# Patient Record
Sex: Female | Born: 1935 | Race: White | Hispanic: No | Smoking: Never smoker
Health system: Southern US, Community
[De-identification: ages and names within clinical notes are randomized; demographics above are authoritative.]

## PROBLEM LIST (undated history)

## (undated) DIAGNOSIS — E039 Hypothyroidism, unspecified: Secondary | ICD-10-CM

## (undated) DIAGNOSIS — I499 Cardiac arrhythmia, unspecified: Secondary | ICD-10-CM

## (undated) DIAGNOSIS — E876 Hypokalemia: Secondary | ICD-10-CM

## (undated) DIAGNOSIS — H409 Unspecified glaucoma: Secondary | ICD-10-CM

## (undated) DIAGNOSIS — I4891 Unspecified atrial fibrillation: Secondary | ICD-10-CM

## (undated) DIAGNOSIS — H3530 Unspecified macular degeneration: Secondary | ICD-10-CM

## (undated) HISTORY — PX: CATARACT EXTRACTION: SUR2

## (undated) HISTORY — PX: APPENDECTOMY: SHX54

## (undated) HISTORY — DX: Hypokalemia: E87.6

## (undated) HISTORY — PX: ABDOMINAL HYSTERECTOMY: SHX81

## (undated) HISTORY — DX: Unspecified macular degeneration: H35.30

---

## 1999-10-30 ENCOUNTER — Encounter: Payer: Self-pay | Admitting: Family Medicine

## 1999-10-30 ENCOUNTER — Ambulatory Visit (HOSPITAL_COMMUNITY): Admission: RE | Admit: 1999-10-30 | Discharge: 1999-10-30 | Payer: Self-pay | Admitting: Family Medicine

## 2003-03-18 ENCOUNTER — Ambulatory Visit (HOSPITAL_COMMUNITY): Admission: RE | Admit: 2003-03-18 | Discharge: 2003-03-18 | Payer: Self-pay | Admitting: Gastroenterology

## 2003-03-25 ENCOUNTER — Encounter: Admission: RE | Admit: 2003-03-25 | Discharge: 2003-03-25 | Payer: Self-pay | Admitting: Gastroenterology

## 2010-09-09 ENCOUNTER — Emergency Department (HOSPITAL_COMMUNITY)
Admission: EM | Admit: 2010-09-09 | Discharge: 2010-09-09 | Disposition: A | Discharge status: Home / Self Care | Payer: Medicare Other | Attending: Emergency Medicine | Admitting: Emergency Medicine

## 2010-09-09 DIAGNOSIS — R51 Headache: Secondary | ICD-10-CM | POA: Insufficient documentation

## 2010-09-09 DIAGNOSIS — E039 Hypothyroidism, unspecified: Secondary | ICD-10-CM | POA: Insufficient documentation

## 2010-09-09 DIAGNOSIS — E7800 Pure hypercholesterolemia, unspecified: Secondary | ICD-10-CM | POA: Insufficient documentation

## 2010-09-09 DIAGNOSIS — I10 Essential (primary) hypertension: Secondary | ICD-10-CM | POA: Insufficient documentation

## 2010-09-09 LAB — URINALYSIS, ROUTINE W REFLEX MICROSCOPIC
Specific Gravity, Urine: 1.009 (ref 1.005–1.030)
Urobilinogen, UA: 0.2 mg/dL (ref 0.0–1.0)

## 2010-09-09 LAB — CBC
HCT: 41.7 % (ref 36.0–46.0)
Hemoglobin: 14.1 g/dL (ref 12.0–15.0)
MCH: 32.3 pg (ref 26.0–34.0)
MCHC: 33.8 g/dL (ref 30.0–36.0)
MCV: 95.6 fL (ref 78.0–100.0)
Platelets: 242 10*3/uL (ref 150–400)
RBC: 4.36 MIL/uL (ref 3.87–5.11)
RDW: 13.1 % (ref 11.5–15.5)
WBC: 7.8 10*3/uL (ref 4.0–10.5)

## 2010-09-09 LAB — URINE MICROSCOPIC-ADD ON

## 2010-09-09 LAB — BASIC METABOLIC PANEL
BUN: 22 mg/dL (ref 6–23)
CO2: 26 mEq/L (ref 19–32)
Calcium: 10.1 mg/dL (ref 8.4–10.5)
Chloride: 105 mEq/L (ref 96–112)
Creatinine, Ser: 0.99 mg/dL (ref 0.4–1.2)
GFR calc Af Amer: >60 mL/min (ref >60)
GFR calc non Af Amer: 53 mL/min — ABNORMAL LOW (ref >60)
Glucose, Bld: 127 mg/dL — ABNORMAL HIGH (ref 70–99)
Potassium: 3.7 mEq/L (ref 3.5–5.1)
Sodium: 141 mEq/L (ref 135–145)

## 2015-02-17 ENCOUNTER — Emergency Department (HOSPITAL_COMMUNITY): Payer: Medicare Other

## 2015-02-17 ENCOUNTER — Inpatient Hospital Stay (HOSPITAL_COMMUNITY)
Admission: EM | Admit: 2015-02-17 | Discharge: 2015-02-22 | DRG: 481 | Disposition: A | Discharge status: Skilled Nursing Facility | Payer: Medicare Other | Attending: Internal Medicine | Admitting: Internal Medicine

## 2015-02-17 ENCOUNTER — Encounter (HOSPITAL_COMMUNITY): Payer: Self-pay | Admitting: Emergency Medicine

## 2015-02-17 DIAGNOSIS — Z66 Do not resuscitate: Secondary | ICD-10-CM | POA: Diagnosis present

## 2015-02-17 DIAGNOSIS — I10 Essential (primary) hypertension: Secondary | ICD-10-CM | POA: Diagnosis not present

## 2015-02-17 DIAGNOSIS — S72002A Fracture of unspecified part of neck of left femur, initial encounter for closed fracture: Secondary | ICD-10-CM | POA: Diagnosis present

## 2015-02-17 DIAGNOSIS — S72009A Fracture of unspecified part of neck of unspecified femur, initial encounter for closed fracture: Secondary | ICD-10-CM | POA: Insufficient documentation

## 2015-02-17 DIAGNOSIS — D62 Acute posthemorrhagic anemia: Secondary | ICD-10-CM | POA: Diagnosis not present

## 2015-02-17 DIAGNOSIS — Y92009 Unspecified place in unspecified non-institutional (private) residence as the place of occurrence of the external cause: Secondary | ICD-10-CM

## 2015-02-17 DIAGNOSIS — E119 Type 2 diabetes mellitus without complications: Secondary | ICD-10-CM | POA: Diagnosis present

## 2015-02-17 DIAGNOSIS — R748 Abnormal levels of other serum enzymes: Secondary | ICD-10-CM | POA: Diagnosis present

## 2015-02-17 DIAGNOSIS — Z79899 Other long term (current) drug therapy: Secondary | ICD-10-CM

## 2015-02-17 DIAGNOSIS — I482 Chronic atrial fibrillation: Secondary | ICD-10-CM | POA: Diagnosis not present

## 2015-02-17 DIAGNOSIS — B9620 Unspecified Escherichia coli [E. coli] as the cause of diseases classified elsewhere: Secondary | ICD-10-CM | POA: Diagnosis present

## 2015-02-17 DIAGNOSIS — R112 Nausea with vomiting, unspecified: Secondary | ICD-10-CM | POA: Diagnosis not present

## 2015-02-17 DIAGNOSIS — E039 Hypothyroidism, unspecified: Secondary | ICD-10-CM | POA: Diagnosis present

## 2015-02-17 DIAGNOSIS — S72002D Fracture of unspecified part of neck of left femur, subsequent encounter for closed fracture with routine healing: Secondary | ICD-10-CM | POA: Diagnosis not present

## 2015-02-17 DIAGNOSIS — W1789XA Other fall from one level to another, initial encounter: Secondary | ICD-10-CM | POA: Diagnosis present

## 2015-02-17 DIAGNOSIS — Z7901 Long term (current) use of anticoagulants: Secondary | ICD-10-CM | POA: Diagnosis not present

## 2015-02-17 DIAGNOSIS — E785 Hyperlipidemia, unspecified: Secondary | ICD-10-CM | POA: Diagnosis present

## 2015-02-17 DIAGNOSIS — D509 Iron deficiency anemia, unspecified: Secondary | ICD-10-CM | POA: Diagnosis present

## 2015-02-17 DIAGNOSIS — S60222A Contusion of left hand, initial encounter: Secondary | ICD-10-CM | POA: Diagnosis present

## 2015-02-17 DIAGNOSIS — I4891 Unspecified atrial fibrillation: Secondary | ICD-10-CM | POA: Diagnosis present

## 2015-02-17 DIAGNOSIS — H409 Unspecified glaucoma: Secondary | ICD-10-CM | POA: Diagnosis present

## 2015-02-17 DIAGNOSIS — N390 Urinary tract infection, site not specified: Secondary | ICD-10-CM | POA: Diagnosis present

## 2015-02-17 DIAGNOSIS — E876 Hypokalemia: Secondary | ICD-10-CM | POA: Diagnosis present

## 2015-02-17 DIAGNOSIS — S72142A Displaced intertrochanteric fracture of left femur, initial encounter for closed fracture: Secondary | ICD-10-CM | POA: Diagnosis present

## 2015-02-17 DIAGNOSIS — I5032 Chronic diastolic (congestive) heart failure: Secondary | ICD-10-CM | POA: Diagnosis present

## 2015-02-17 DIAGNOSIS — K5900 Constipation, unspecified: Secondary | ICD-10-CM | POA: Diagnosis not present

## 2015-02-17 DIAGNOSIS — I517 Cardiomegaly: Secondary | ICD-10-CM | POA: Diagnosis not present

## 2015-02-17 DIAGNOSIS — Z419 Encounter for procedure for purposes other than remedying health state, unspecified: Secondary | ICD-10-CM

## 2015-02-17 DIAGNOSIS — I110 Hypertensive heart disease with heart failure: Secondary | ICD-10-CM | POA: Diagnosis present

## 2015-02-17 DIAGNOSIS — W19XXXA Unspecified fall, initial encounter: Secondary | ICD-10-CM

## 2015-02-17 HISTORY — DX: Hypothyroidism, unspecified: E03.9

## 2015-02-17 HISTORY — DX: Unspecified glaucoma: H40.9

## 2015-02-17 HISTORY — DX: Unspecified atrial fibrillation: I48.91

## 2015-02-17 HISTORY — DX: Cardiac arrhythmia, unspecified: I49.9

## 2015-02-17 LAB — URINE MICROSCOPIC-ADD ON

## 2015-02-17 LAB — BASIC METABOLIC PANEL
Anion gap: 15 (ref 5–15)
BUN: 17 mg/dL (ref 6–20)
CHLORIDE: 102 mmol/L (ref 101–111)
CO2: 25 mmol/L (ref 22–32)
CREATININE: 0.96 mg/dL (ref 0.44–1.00)
Calcium: 9.8 mg/dL (ref 8.9–10.3)
GFR calc Af Amer: 58 mL/min — ABNORMAL LOW (ref >60)
GFR calc non Af Amer: 50 mL/min — ABNORMAL LOW (ref >60)
Glucose, Bld: 192 mg/dL — ABNORMAL HIGH (ref 65–99)
Potassium: 3.2 mmol/L — ABNORMAL LOW (ref 3.5–5.1)
SODIUM: 142 mmol/L (ref 135–145)

## 2015-02-17 LAB — URINALYSIS, ROUTINE W REFLEX MICROSCOPIC
BILIRUBIN URINE: NEGATIVE
GLUCOSE, UA: NEGATIVE mg/dL
KETONES UR: 15 mg/dL — AB
Leukocytes, UA: NEGATIVE
Nitrite: NEGATIVE
PH: 7.5 (ref 5.0–8.0)
Protein, ur: NEGATIVE mg/dL
Specific Gravity, Urine: 1.011 (ref 1.005–1.030)
Urobilinogen, UA: 0.2 mg/dL (ref 0.0–1.0)

## 2015-02-17 LAB — CBC
HCT: 36.3 % (ref 36.0–46.0)
Hemoglobin: 11.7 g/dL — ABNORMAL LOW (ref 12.0–15.0)
MCH: 32 pg (ref 26.0–34.0)
MCHC: 32.2 g/dL (ref 30.0–36.0)
MCV: 99.2 fL (ref 78.0–100.0)
PLATELETS: 248 10*3/uL (ref 150–400)
RBC: 3.66 MIL/uL — ABNORMAL LOW (ref 3.87–5.11)
RDW: 13.7 % (ref 11.5–15.5)
WBC: 10.5 10*3/uL (ref 4.0–10.5)

## 2015-02-17 LAB — ABO/RH: ABO/RH(D): O POS

## 2015-02-17 LAB — TYPE AND SCREEN
ABO/RH(D): O POS
Antibody Screen: NEGATIVE

## 2015-02-17 LAB — CK: Total CK: 316 U/L — ABNORMAL HIGH (ref 38–234)

## 2015-02-17 LAB — GLUCOSE, CAPILLARY: GLUCOSE-CAPILLARY: 178 mg/dL — AB (ref 65–99)

## 2015-02-17 LAB — PROTIME-INR
INR: 1.47 (ref 0.00–1.49)
Prothrombin Time: 17.9 seconds — ABNORMAL HIGH (ref 11.6–15.2)

## 2015-02-17 MED ORDER — MORPHINE SULFATE (PF) 4 MG/ML IV SOLN
4.0000 mg | INTRAVENOUS | Status: DC | PRN
Start: 2015-02-17 — End: 2015-02-17
  Administered 2015-02-17: 2 mg via INTRAVENOUS
  Administered 2015-02-17: 4 mg via INTRAVENOUS
  Filled 2015-02-17 (×2): qty 1

## 2015-02-17 MED ORDER — BRINZOLAMIDE 1 % OP SUSP
1.0000 [drp] | Freq: Two times a day (BID) | OPHTHALMIC | Status: DC
  Administered 2015-02-17 – 2015-02-22 (×10): 1 [drp] via OPHTHALMIC
  Filled 2015-02-17: qty 10

## 2015-02-17 MED ORDER — MORPHINE SULFATE (PF) 2 MG/ML IV SOLN: 2.0000 mg | INTRAVENOUS | Status: DC | PRN

## 2015-02-17 MED ORDER — LEVOTHYROXINE SODIUM 50 MCG PO TABS
50.0000 ug | ORAL_TABLET | Freq: Every day | ORAL | Status: DC
  Administered 2015-02-18 – 2015-02-22 (×4): 50 ug via ORAL
  Filled 2015-02-17 (×6): qty 1

## 2015-02-17 MED ORDER — DOXAZOSIN MESYLATE 2 MG PO TABS
2.0000 mg | ORAL_TABLET | Freq: Every day | ORAL | Status: DC
  Administered 2015-02-18 – 2015-02-19 (×2): 2 mg via ORAL
  Filled 2015-02-17 (×3): qty 1

## 2015-02-17 MED ORDER — ONDANSETRON HCL 4 MG/2ML IJ SOLN
4.0000 mg | Freq: Once | INTRAMUSCULAR | Status: AC
  Administered 2015-02-17: 4 mg via INTRAVENOUS
  Filled 2015-02-17: qty 2

## 2015-02-17 MED ORDER — BRIMONIDINE TARTRATE 0.2 % OP SOLN
1.0000 [drp] | Freq: Two times a day (BID) | OPHTHALMIC | Status: DC
  Administered 2015-02-17 – 2015-02-22 (×10): 1 [drp] via OPHTHALMIC
  Filled 2015-02-17: qty 5

## 2015-02-17 MED ORDER — METOPROLOL TARTRATE 50 MG PO TABS
50.0000 mg | ORAL_TABLET | Freq: Two times a day (BID) | ORAL | Status: DC
  Administered 2015-02-17 – 2015-02-18 (×2): 50 mg via ORAL
  Filled 2015-02-17 (×3): qty 1

## 2015-02-17 MED ORDER — POTASSIUM CHLORIDE CRYS ER 20 MEQ PO TBCR
40.0000 meq | EXTENDED_RELEASE_TABLET | Freq: Once | ORAL | Status: AC
  Administered 2015-02-17: 40 meq via ORAL
  Filled 2015-02-17: qty 2

## 2015-02-17 MED ORDER — SODIUM CHLORIDE 0.9 % IV SOLN
INTRAVENOUS | Status: DC
  Administered 2015-02-17 – 2015-02-19 (×2): via INTRAVENOUS

## 2015-02-17 MED ORDER — TIMOLOL HEMIHYDRATE 0.5 % OP SOLN
1.0000 [drp] | Freq: Two times a day (BID) | OPHTHALMIC | Status: DC
  Filled 2015-02-17 (×2): qty 5

## 2015-02-17 MED ORDER — CEFTRIAXONE SODIUM 1 G IJ SOLR
1.0000 g | Freq: Once | INTRAMUSCULAR | Status: AC
  Administered 2015-02-17: 1 g via INTRAVENOUS
  Filled 2015-02-17: qty 10

## 2015-02-17 MED ORDER — METHOCARBAMOL 500 MG PO TABS: 500.0000 mg | ORAL_TABLET | Freq: Four times a day (QID) | ORAL | Status: DC | PRN

## 2015-02-17 MED ORDER — TIMOLOL MALEATE 0.5 % OP SOLN
1.0000 [drp] | Freq: Two times a day (BID) | OPHTHALMIC | Status: DC
  Administered 2015-02-18 – 2015-02-22 (×10): 1 [drp] via OPHTHALMIC
  Filled 2015-02-17: qty 5

## 2015-02-17 MED ORDER — DEXTROSE 5 % IV SOLN
500.0000 mg | Freq: Four times a day (QID) | INTRAVENOUS | Status: DC | PRN
  Filled 2015-02-17: qty 5

## 2015-02-17 MED ORDER — SENNOSIDES-DOCUSATE SODIUM 8.6-50 MG PO TABS: 1.0000 | ORAL_TABLET | Freq: Every evening | ORAL | Status: DC | PRN

## 2015-02-17 MED ORDER — HYDROCODONE-ACETAMINOPHEN 5-325 MG PO TABS
1.0000 | ORAL_TABLET | Freq: Four times a day (QID) | ORAL | Status: DC | PRN
  Administered 2015-02-17: 2 via ORAL
  Filled 2015-02-17 (×2): qty 2

## 2015-02-17 MED ORDER — ONDANSETRON HCL 4 MG/2ML IJ SOLN: 4.0000 mg | Freq: Once | INTRAMUSCULAR | Status: DC | PRN

## 2015-02-17 MED ORDER — METRONIDAZOLE IN NACL 5-0.79 MG/ML-% IV SOLN
500.0000 mg | Freq: Once | INTRAVENOUS | Status: DC
  Administered 2015-02-17: 500 mg via INTRAVENOUS
  Filled 2015-02-17: qty 100

## 2015-02-17 MED ORDER — FERROUS SULFATE 325 (65 FE) MG PO TABS
325.0000 mg | ORAL_TABLET | Freq: Three times a day (TID) | ORAL | Status: DC
  Administered 2015-02-18 – 2015-02-22 (×11): 325 mg via ORAL
  Filled 2015-02-17 (×14): qty 1

## 2015-02-17 NOTE — ED Notes (Signed)
Pt from home via [facility] with s/p fall today.  Pt was on the floor for approx 2 hours.  Shortening and rotation to left leg noted, with left hip pain.  Hematoma to back of head.  Emesis x 3 after being given 100 mcg fentanyl.  Given 4 mg Zofran.  Pt NAD, A&Ox4.

## 2015-02-17 NOTE — ED Provider Notes (Signed)
CSN: [id number]     Arrival date & time 02/17/15  1644 History   First MD Initiated Contact with Patient 02/17/15 1649     Chief Complaint  Patient presents with  . Fall  . Hip Pain  . Head Injury     HPI  Patient presents for evaluation via EMS after a fall. Lives at an assisted living facility. Was on the floor approximately 2 hours due to pain in her leg. Cannot bear weight or move her left leg without severe pain. Did strike her head has a small hematoma. Given fentanyl (paramedics for pain had episodes of nausea en route. Denies symptoms prior to her fall. She alleges that she slipped and fell.  Past Medical History  Diagnosis Date  . Irregular heart rhythm   . Thyroid disease   . Hypothyroidism   . Hypertension   . Diabetes mellitus without complication ([place])   . Atrial fibrillation ([place])   . CHF (congestive heart failure) ([place])   . Hyperlipidemia   . Glaucoma 02/17/2015   Past Surgical History  Procedure Laterality Date  . Appendectomy    . Abdominal hysterectomy    . Intramedullary (im) nail intertrochanteric Left 02/19/2015    Procedure: INTRAMEDULLARY (IM) NAIL INTERTROCHANTRIC;  Surgeon: Leandrew Koyanagi, MD;  Location: [place];  Service: Orthopedics;  Laterality: Left;   History reviewed. No pertinent family history. Social History  Substance Use Topics  . Smoking status: Never Smoker   . Smokeless tobacco: Never Used  . Alcohol Use: No   OB History    No data available     Review of Systems  Constitutional: Negative for fever, chills, diaphoresis, appetite change and fatigue.  HENT: Negative for mouth sores, sore throat and trouble swallowing.        Left scalp hematoma  Eyes: Negative for visual disturbance.  Respiratory: Negative for cough, chest tightness, shortness of breath and wheezing.   Cardiovascular: Negative for chest pain.  Gastrointestinal: Negative for nausea, vomiting, abdominal pain, diarrhea and abdominal distention.  Endocrine: Negative for  polydipsia, polyphagia and polyuria.  Genitourinary: Negative for dysuria, frequency and hematuria.  Musculoskeletal: Negative for gait problem.       Severe left hip pain.  Skin: Negative for color change, pallor and rash.  Neurological: Negative for dizziness, syncope, light-headedness and headaches.  Hematological: Does not bruise/bleed easily.  Psychiatric/Behavioral: Negative for behavioral problems and confusion.      Allergies  Review of patient's allergies indicates no known allergies.  Home Medications   Prior to Admission medications   Medication Sig Start Date End Date Taking? Authorizing Provider  Brinzolamide-Brimonidine [facility]) 1-0.2 % SUSP Apply 1 drop to eye 2 (two) times daily.   Yes Historical Provider, MD  ibuprofen (ADVIL,MOTRIN) 200 MG tablet Take 400 mg by mouth every 6 (six) hours as needed for moderate pain.   Yes Historical Provider, MD  levothyroxine (SYNTHROID, LEVOTHROID) 50 MCG tablet Take 50 mcg by mouth daily before breakfast.   Yes Historical Provider, MD  lovastatin (MEVACOR) 20 MG tablet Take 20 mg by mouth daily at 6 PM.   Yes Historical Provider, MD  metoprolol (LOPRESSOR) 100 MG tablet Take 100 mg by mouth 2 (two) times daily.   Yes Historical Provider, MD  Multiple Vitamins-Minerals (PRESERVISION AREDS 2) CAPS Take 1 capsule by mouth 2 (two) times daily.   Yes Historical Provider, MD  timolol (BETIMOL) 0.5 % ophthalmic solution Place 1 drop into both eyes 2 (two) times daily.   Yes  Historical Provider, MD  acetaminophen (TYLENOL) 325 MG tablet Take 2 tablets (650 mg total) by mouth every 6 (six) hours as needed for mild pain, moderate pain, fever or headache. 02/21/15   Janece Canterbury, MD  docusate sodium (COLACE) 100 MG capsule Take 1 capsule (100 mg total) by mouth 2 (two) times daily. 02/21/15   Janece Canterbury, MD  feeding supplement, ENSURE ENLIVE, (ENSURE ENLIVE) LIQD Take 237 mLs by mouth 2 (two) times daily between meals. 02/21/15    Janece Canterbury, MD  ferrous sulfate 325 (65 FE) MG tablet Take 1 tablet (325 mg total) by mouth daily with breakfast. 02/21/15   Janece Canterbury, MD  Rivaroxaban (XARELTO) 15 MG TABS tablet Take 1 tablet (15 mg total) by mouth daily with supper. 02/21/15   Janece Canterbury, MD  traMADol (ULTRAM) 50 MG tablet Take 1 tablet (50 mg total) by mouth every 6 (six) hours as needed for moderate pain or severe pain. 02/21/15   Janece Canterbury, MD   BP 134/80 mmHg  Pulse 78  Temp(Src) 98 F (36.7 C) (Oral)  Resp 19  Ht 5\' 5"  (1.651 m)  Wt 142 lb 3.2 oz (64.5 kg)  BMI 23.66 kg/m2  SpO2 95% Physical Exam  Constitutional: She is oriented to person, place, and time. She appears well-developed and well-nourished. No distress.  HENT:  Head: Normocephalic.  Left parietal occipital hematoma. No blood over the TMs, mastoids, or from ears nose or mouth. No midline neck or back pain.  Eyes: Conjunctivae are normal. Pupils are equal, round, and reactive to light. No scleral icterus.  Neck: Normal range of motion. Neck supple. No thyromegaly present.  Cardiovascular: Normal rate and regular rhythm.  Exam reveals no gallop and no friction rub.   No murmur heard. Pulmonary/Chest: Effort normal and breath sounds normal. No respiratory distress. She has no wheezes. She has no rales.  Abdominal: Soft. Bowel sounds are normal. She exhibits no distension. There is no tenderness. There is no rebound.  Musculoskeletal: Normal range of motion.  Left hip flexed externally rotated.  Normal sensation and neurovascular exam.  Neurological: She is alert and oriented to person, place, and time.  Skin: Skin is warm and dry. No rash noted.  Psychiatric: She has a normal mood and affect. Her behavior is normal.    ED Course  Procedures (including critical care time) Labs Review Labs Reviewed  BASIC METABOLIC PANEL - Abnormal; Notable for the following:    Potassium 3.2 (*)    Glucose, Bld 192 (*)    GFR calc non Af Amer  50 (*)    GFR calc Af Amer 58 (*)    All other components within normal limits  CBC - Abnormal; Notable for the following:    RBC 3.66 (*)    Hemoglobin 11.7 (*)    All other components within normal limits  URINALYSIS, ROUTINE W REFLEX MICROSCOPIC (NOT AT [facility] - Abnormal; Notable for the following:    Hgb urine dipstick MODERATE (*)    Ketones, ur 15 (*)    All other components within normal limits  CK - Abnormal; Notable for the following:    Total CK 316 (*)    All other components within normal limits  URINE MICROSCOPIC-ADD ON - Abnormal; Notable for the following:    Bacteria, UA MANY (*)    All other components within normal limits  VITAMIN D 25 HYDROXY - Abnormal; Notable for the following:    Vit D, 25-Hydroxy 27.7 (*)  All other components within normal limits  PROTIME-INR - Abnormal; Notable for the following:    Prothrombin Time 17.9 (*)    All other components within normal limits  CBC WITH DIFFERENTIAL/PLATELET - Abnormal; Notable for the following:    RBC 3.31 (*)    Hemoglobin 10.3 (*)    HCT 32.5 (*)    Neutro Abs 7.9 (*)    All other components within normal limits  COMPREHENSIVE METABOLIC PANEL - Abnormal; Notable for the following:    Glucose, Bld 141 (*)    Total Protein 6.1 (*)    Albumin 3.1 (*)    GFR calc non Af Amer 54 (*)    All other components within normal limits  GLUCOSE, CAPILLARY - Abnormal; Notable for the following:    Glucose-Capillary 178 (*)    All other components within normal limits  BASIC METABOLIC PANEL - Abnormal; Notable for the following:    Glucose, Bld 102 (*)    GFR calc non Af Amer 51 (*)    GFR calc Af Amer 60 (*)    All other components within normal limits  CBC - Abnormal; Notable for the following:    RBC 3.19 (*)    Hemoglobin 10.0 (*)    HCT 31.8 (*)    All other components within normal limits  IRON AND TIBC - Abnormal; Notable for the following:    Iron 24 (*)    Saturation Ratios 9 (*)    All other  components within normal limits  BASIC METABOLIC PANEL - Abnormal; Notable for the following:    Potassium 3.4 (*)    Glucose, Bld 101 (*)    Creatinine, Ser 1.03 (*)    Calcium 8.6 (*)    GFR calc non Af Amer 46 (*)    GFR calc Af Amer 53 (*)    All other components within normal limits  CBC - Abnormal; Notable for the following:    RBC 2.73 (*)    Hemoglobin 8.6 (*)    HCT 27.1 (*)    All other components within normal limits  BASIC METABOLIC PANEL - Abnormal; Notable for the following:    Glucose, Bld 126 (*)    Creatinine, Ser 1.07 (*)    GFR calc non Af Amer 44 (*)    GFR calc Af Amer 51 (*)    All other components within normal limits  CBC - Abnormal; Notable for the following:    RBC 2.80 (*)    Hemoglobin 8.7 (*)    HCT 27.6 (*)    All other components within normal limits  BASIC METABOLIC PANEL - Abnormal; Notable for the following:    Glucose, Bld 125 (*)    BUN 21 (*)    GFR calc non Af Amer 55 (*)    All other components within normal limits  CBC - Abnormal; Notable for the following:    RBC 2.80 (*)    Hemoglobin 8.6 (*)    HCT 27.8 (*)    All other components within normal limits  URINE CULTURE  SURGICAL PCR SCREEN  CK  LIPASE, BLOOD  FERRITIN  VITAMIN B12  FOLATE  TSH  CBG MONITORING, ED  TYPE AND SCREEN  ABO/RH    Imaging Review No results found. I have personally reviewed and evaluated these images and lab results as part of my medical decision-making.   EKG Interpretation   Date/Time:  Friday February 17 2015 17:19:34 EDT Ventricular Rate:  86 PR Interval:  QRS Duration: 91 QT Interval:  423 QTC Calculation: 506 R Axis:   56 Text Interpretation:  Atrial fibrillation Ventricular premature complex  Nonspecific repol abnormality, inferior leads Prolonged QT interval  Confirmed by Jeneen Rinks  MD, [place] ([id number]) on 02/17/2015 7:58:12 PM      MDM   Final diagnoses:  Hip fracture, left, closed, initial encounter [facility])    Discussed with  orthopedics, Dr.Xu, and Hospitalist Dr. Posey Pronto.  Plan for operative intervention in 48 hours with consideration for the patient's Xarelto.    Tanna Furry, MD 03/09/15 0730

## 2015-02-17 NOTE — H&P (Signed)
[facility] History and Physical  Patient: Brianna Thompson  MRN: [id number]  DOB: 28-Mar-1922  DOS: the patient was seen and examined on 02/17/2015 PCP: Leonard Downing, MD  Referring physician: Dr. Jeneen Rinks Chief Complaint: Fall  HPI: Brianna Thompson is a 81 y.o. female with Past medical history of atrial fibrillation on chronic anticoagulation, chronic systolic CHF, diabetes mellitus, hypertension, hypothyroidism, dyslipidemia. The patient is presenting with a mechanical fall. While she was in the kitchen she is on a step on stool and the lost her balance and fell on the ground. She hit her head as well as her left side of the body. She was complaining of significant pain in the left hip and was unable to stand on her own. She lied down there for 2 hours until she had some help from the assisted living facility. Patient at time of my evaluation denies having any complaints of headache, neck pain, vision changes, focal deficit, pain anywhere else, chest pain, abdominal pain, nausea, vomiting, shortness of breath, numbness in her legs. She also denies having any prior diarrhea or constipation or burning urination or recent changes in medication.  The patient is coming from ALF At her baseline ambulates with cane for last 6 months And is independent for most of her ADL; manages her medication on her own.  Review of Systems: as mentioned in the history of present illness.  A comprehensive review of the other systems is negative.  Past Medical History  Diagnosis Date  . Irregular heart rhythm   . Thyroid disease   . Hypothyroidism   . Hypertension   . Diabetes mellitus without complication ([place])   . Atrial fibrillation ([place])   . CHF (congestive heart failure) ([place])   . Hyperlipidemia    Past Surgical History  Procedure Laterality Date  . Appendectomy    . Abdominal hysterectomy     Social History:  reports that she has never smoked. She has never used smokeless  tobacco. She reports that she does not drink alcohol or use illicit drugs.  No Known Allergies  History reviewed. No pertinent family history.  Prior to Admission medications   Medication Sig Start Date End Date Taking? Authorizing Provider  Brinzolamide-Brimonidine [facility]) 1-0.2 % SUSP Apply 1 drop to eye 2 (two) times daily.   Yes Historical Provider, MD  doxazosin (CARDURA) 2 MG tablet Take 2 mg by mouth at bedtime.    Yes Historical Provider, MD  furosemide (LASIX) 40 MG tablet Take 20 mg by mouth daily.   Yes Historical Provider, MD  ibuprofen (ADVIL,MOTRIN) 200 MG tablet Take 400 mg by mouth every 6 (six) hours as needed for moderate pain.   Yes Historical Provider, MD  levothyroxine (SYNTHROID, LEVOTHROID) 50 MCG tablet Take 50 mcg by mouth daily before breakfast.   Yes Historical Provider, MD  lisinopril (PRINIVIL,ZESTRIL) 20 MG tablet Take 20 mg by mouth every 12 (twelve) hours.   Yes Historical Provider, MD  lovastatin (MEVACOR) 20 MG tablet Take 20 mg by mouth daily at 6 PM.   Yes Historical Provider, MD  metoprolol (LOPRESSOR) 100 MG tablet Take 100 mg by mouth 2 (two) times daily.   Yes Historical Provider, MD  Multiple Vitamins-Minerals (PRESERVISION AREDS 2) CAPS Take 1 capsule by mouth 2 (two) times daily.   Yes Historical Provider, MD  rivaroxaban (XARELTO) 20 MG TABS tablet Take 20 mg by mouth daily with supper.   Yes Historical Provider, MD  timolol (BETIMOL) 0.5 % ophthalmic solution Place 1 drop  into both eyes 2 (two) times daily.   Yes Historical Provider, MD  traMADol (ULTRAM) 50 MG tablet Take 12.5 mg by mouth 3 (three) times daily.    Yes Historical Provider, MD    Physical Exam: Filed Vitals:   02/17/15 1921 02/17/15 1930 02/17/15 2000 02/17/15 2030  BP: 159/84 168/77 146/87 139/94  Pulse:  83 86 87  SpO2:  100% 100% 100%    General: Alert, Awake and Oriented to Time, Place and Person. Appear in mild distress Eyes: PERRL ENT: Oral Mucosa clear  moist. Neck: no JVD Cardiovascular: S1 and S2 Present, no Murmur, Peripheral Pulses Present Respiratory: Bilateral Air entry equal and Decreased,  Clear to Auscultation, no Crackles, no wheezes Abdomen: Bowel Sound present, Soft and no tenderness Skin: no Rash Extremities: no Pedal edema, no calf tenderness Left forearm hematoma without any fracture or pain Neurologic: Grossly no focal neuro deficit.  Labs on Admission:  CBC:  Recent Labs Lab 02/17/15 1725  WBC 10.5  HGB 11.7*  HCT 36.3  MCV 99.2  PLT 248    CMP     Component Value Date/Time   NA 142 02/17/2015 1725   K 3.2* 02/17/2015 1725   CL 102 02/17/2015 1725   CO2 25 02/17/2015 1725   GLUCOSE 192* 02/17/2015 1725   BUN 17 02/17/2015 1725   CREATININE 0.96 02/17/2015 1725   CALCIUM 9.8 02/17/2015 1725   GFRNONAA 50* 02/17/2015 1725   GFRAA 58* 02/17/2015 1725     Recent Labs Lab 02/17/15 1815  CKTOTAL 316*   BNP (last 3 results) No results for input(s): BNP in the last 8760 hours.  ProBNP (last 3 results) No results for input(s): PROBNP in the last 8760 hours.   Radiological Exams on Admission: Dg Chest 1 View  02/17/2015  CLINICAL DATA:  81 year old who fell and sustained a comminuted intertrochanteric left femoral neck fracture. Preoperative evaluation. EXAM: CHEST 1 VIEW COMPARISON:  None. FINDINGS: Cardiac silhouette moderately enlarged. Thoracic aorta atherosclerotic. Hilar and mediastinal contours otherwise unremarkable. Mildly prominent bronchovascular markings diffusely. Lungs otherwise clear. No localized airspace consolidation. No pleural effusions. No pneumothorax. Normal pulmonary vascularity. Prominent paracardiac fat pad on the left. IMPRESSION: Cardiomegaly.  No acute cardiopulmonary disease. Electronically Signed   By: Evangeline Dakin M.D.   On: 02/17/2015 18:16   Dg Lumbar Spine Complete  02/17/2015  CLINICAL DATA:  Fall with low back pain EXAM: LUMBAR SPINE - COMPLETE 4+ VIEW  COMPARISON:  None. FINDINGS: This report assumes 5 non rib-bearing lumbar vertebrae. Mild levocurvature of the lumbar spine. Lumbar vertebral body heights are preserved, with no fracture or suspicious focal osseous lesion. Mild-to-moderate degenerative disc disease throughout the lumbar spine, most prominent at L3-4. No spondylolisthesis. Moderate facet arthropathy bilaterally in the lower lumbar spine. Atherosclerotic calcifications throughout the abdominal aorta. IMPRESSION: 1. No lumbar spine fracture or spondylolisthesis. 2. Moderate degenerative changes as described. Electronically Signed   By: Ilona Sorrel M.D.   On: 02/17/2015 18:10   Ct Head Wo Contrast  02/17/2015  CLINICAL DATA:  Fall.  Complaining of headache and neck pain. EXAM: CT HEAD WITHOUT CONTRAST CT CERVICAL SPINE WITHOUT CONTRAST TECHNIQUE: Multidetector CT imaging of the head and cervical spine was performed following the standard protocol without intravenous contrast. Multiplanar CT image reconstructions of the cervical spine were also generated. COMPARISON:  None. FINDINGS: CT HEAD FINDINGS New ventricles are normal configuration. There is ventricular and sulcal enlargement reflecting moderate atrophy. There are no parenchymal masses or mass effect. There is  no evidence a cortical infarct. Minor periventricular white matter hypoattenuation is noted consistent chronic microvascular ischemic change. There are no extra-axial masses or abnormal fluid collections. There is no intracranial hemorrhage. Right maxillary sinus is opacified. There is associated wall thickening. This is chronic. Remaining sinuses are clear as are the mastoid air cells. No skull fracture. CT CERVICAL SPINE FINDINGS No fracture. There is slight anterolisthesis of C3 and C4, C4-C5 and C5-C6. There is moderate loss disc height at C6-C7. Small endplate spurs are noted along the mid and lower cervical spine. There is facet degenerative change bilaterally, most evident in the  mid cervical spine. Bones are diffusely demineralized. The central spinal canal and neural foramina are well preserved. Soft tissue evaluation demonstrates multiple thyroid nodules, many with calcifications. A 16 mm nodules noted on the left. The other nodules are less well-defined. There are not dense carotid vascular calcifications. Lung apices show mild scarring but are otherwise clear. IMPRESSION: HEAD CT:  No acute intracranial abnormalities. CERVICAL CT:  No fracture or acute finding. Electronically Signed   By: Lajean Manes M.D.   On: 02/17/2015 19:00   Ct Cervical Spine Wo Contrast  02/17/2015  CLINICAL DATA:  Fall.  Complaining of headache and neck pain. EXAM: CT HEAD WITHOUT CONTRAST CT CERVICAL SPINE WITHOUT CONTRAST TECHNIQUE: Multidetector CT imaging of the head and cervical spine was performed following the standard protocol without intravenous contrast. Multiplanar CT image reconstructions of the cervical spine were also generated. COMPARISON:  None. FINDINGS: CT HEAD FINDINGS New ventricles are normal configuration. There is ventricular and sulcal enlargement reflecting moderate atrophy. There are no parenchymal masses or mass effect. There is no evidence a cortical infarct. Minor periventricular white matter hypoattenuation is noted consistent chronic microvascular ischemic change. There are no extra-axial masses or abnormal fluid collections. There is no intracranial hemorrhage. Right maxillary sinus is opacified. There is associated wall thickening. This is chronic. Remaining sinuses are clear as are the mastoid air cells. No skull fracture. CT CERVICAL SPINE FINDINGS No fracture. There is slight anterolisthesis of C3 and C4, C4-C5 and C5-C6. There is moderate loss disc height at C6-C7. Small endplate spurs are noted along the mid and lower cervical spine. There is facet degenerative change bilaterally, most evident in the mid cervical spine. Bones are diffusely demineralized. The central  spinal canal and neural foramina are well preserved. Soft tissue evaluation demonstrates multiple thyroid nodules, many with calcifications. A 16 mm nodules noted on the left. The other nodules are less well-defined. There are not dense carotid vascular calcifications. Lung apices show mild scarring but are otherwise clear. IMPRESSION: HEAD CT:  No acute intracranial abnormalities. CERVICAL CT:  No fracture or acute finding. Electronically Signed   By: Lajean Manes M.D.   On: 02/17/2015 19:00   Dg Hip Unilat With Pelvis 2-3 Views Left  02/17/2015  CLINICAL DATA:  81 year old female with history of trauma from a fall complaining of left-sided hip pain. EXAM: DG HIP (WITH OR WITHOUT PELVIS) 2-3V LEFT COMPARISON:  No priors. FINDINGS: Three views of the bony pelvis and left hip demonstrate a comminuted intertrochanteric fracture of the left hip with some proximal migration of the distal fracture fragments, and approximately 30 degrees of varus angulation. There is wide distraction of multiple fracture fragments, particularly the lesser trochanteric fracture fragment which is approximately 1.5 cm medially displaced. Bony pelvis appears grossly intact, as does the visualized portions of the right proximal femur. Left femoral head remains located. IMPRESSION: 1. Highly comminuted, displaced  and angulated left intertrochanteric hip fracture, as above. Electronically Signed   By: Vinnie Langton M.D.   On: 02/17/2015 18:09   EKG: Independently reviewed. atrial fibrillation, rate controlled.  Assessment/Plan 1. Closed left hip fracture [facility]) Patient presents with complains of fall. CT of the head and cervical spine are unremarkable. X-ray shows she has intertrochanteric fracture of the left femur. Dr. Erlinda Hong from orthopedics has been consulted who will be following up on the patient. Tentative procedure date he is on Sunday. Patient's last Xarelto was on Thursday evening. We will get echocardiogram in the  morning. Get type and screen. I'll hold Xarelto at present. Currently the patient does not appear to be requiring any bridging therapy.  2.A) Cardiac risk: Based on RCRI  >History of HF  With this the patient is a moderate to high risk for adverse Cardiac outcome from surgery. Recommend further work up with echocardiogram prior to surgery. Be watchful of hydration since the pt has history of CHF. Monitor Ins and Out. Continue  to hold Xarelto, Hold lisinopril and diuretics.  B) Pulmonary risk: Recommend optimization of lung function with use of incentive spirometry. Good pulmunary toilet.  C) General risk: Avoid major fluctuation in blood pressure intra-op and post operatively. Minimal sedation and Narcotics.  Will request Surgeon to please Order Lovenox/DVT prophylaxis of his/her choice when OK from Surgeon's standpoint post op.    3. Atrial fibrillation ([place]), CHA2DS2-VASc Score 6   Cardiomegaly Chronic diastolic CHF. Check echo program before surgery. Holding lisinopril and Lasix. Continuing Lopressor but in the setting of normal blood pressure as well as heart rate reducing the dose to 50 mg from 100 mg twice a day.  4  Essential hypertension Blood pressures are stable. Holding antihypertensive medication other than metoprolol.  5  Fall Neuro deficit at present. CT head and C-spine is negative. Patient denies any other complaints of pain anywhere. Continue close monitoring on telemetry.  6  Hypokalemia Replacing orally and recheck in the morning.  7  Hypothyroidism Continuing Synthroid.  8  Elevated CPK Patient has very mild elevation of CPK in the setting of prolonged 1-1/2 hour to 2 hours immobilization after the fall. Gentle IV hydration overnight recheck CPK in the morning and discontinue hydration should the patient's significant returns to normal.  9 Glaucoma continuing home medications.    Nutrition: Cardiac diet  DVT Prophylaxis: mechanical  compression device  Advance goals of care discussion: DNR/DNI as per my discussion with patient.   Consults: Orthopedics  Family Communication: family was present at bedside, opportunity was given to ask question and all questions were answered satisfactorily at the time of interview. Disposition: Admitted as inpatient, telemetry unit.  Author: Berle Mull, MD [facility] Pager: [phone] 02/17/2015  If 7PM-7AM, please contact night-coverage [email or web address] Password [email or web address]

## 2015-02-18 DIAGNOSIS — I482 Chronic atrial fibrillation: Secondary | ICD-10-CM

## 2015-02-18 DIAGNOSIS — I5032 Chronic diastolic (congestive) heart failure: Secondary | ICD-10-CM

## 2015-02-18 DIAGNOSIS — I10 Essential (primary) hypertension: Secondary | ICD-10-CM

## 2015-02-18 DIAGNOSIS — S72002A Fracture of unspecified part of neck of left femur, initial encounter for closed fracture: Secondary | ICD-10-CM

## 2015-02-18 LAB — COMPREHENSIVE METABOLIC PANEL
ALBUMIN: 3.1 g/dL — AB (ref 3.5–5.0)
ALT: 19 U/L (ref 14–54)
AST: 24 U/L (ref 15–41)
Alkaline Phosphatase: 83 U/L (ref 38–126)
Anion gap: 8 (ref 5–15)
BUN: 15 mg/dL (ref 6–20)
CO2: 28 mmol/L (ref 22–32)
CREATININE: 0.9 mg/dL (ref 0.44–1.00)
Calcium: 8.9 mg/dL (ref 8.9–10.3)
Chloride: 102 mmol/L (ref 101–111)
GFR calc Af Amer: >60 mL/min (ref >60)
GFR calc non Af Amer: 54 mL/min — ABNORMAL LOW (ref >60)
Glucose, Bld: 141 mg/dL — ABNORMAL HIGH (ref 65–99)
POTASSIUM: 4 mmol/L (ref 3.5–5.1)
SODIUM: 138 mmol/L (ref 135–145)
Total Bilirubin: 0.6 mg/dL (ref 0.3–1.2)
Total Protein: 6.1 g/dL — ABNORMAL LOW (ref 6.5–8.1)

## 2015-02-18 LAB — LIPASE, BLOOD: Lipase: 32 U/L (ref 11–51)

## 2015-02-18 LAB — CBC WITH DIFFERENTIAL/PLATELET
BASOS ABS: 0 10*3/uL (ref 0.0–0.1)
BASOS PCT: 0 %
EOS ABS: 0 10*3/uL (ref 0.0–0.7)
EOS PCT: 0 %
HCT: 32.5 % — ABNORMAL LOW (ref 36.0–46.0)
Hemoglobin: 10.3 g/dL — ABNORMAL LOW (ref 12.0–15.0)
LYMPHS PCT: 10 %
Lymphs Abs: 0.9 10*3/uL (ref 0.7–4.0)
MCH: 31.1 pg (ref 26.0–34.0)
MCHC: 31.7 g/dL (ref 30.0–36.0)
MCV: 98.2 fL (ref 78.0–100.0)
MONO ABS: 0.6 10*3/uL (ref 0.1–1.0)
Monocytes Relative: 6 %
Neutro Abs: 7.9 10*3/uL — ABNORMAL HIGH (ref 1.7–7.7)
Neutrophils Relative %: 84 %
PLATELETS: 240 10*3/uL (ref 150–400)
RBC: 3.31 MIL/uL — AB (ref 3.87–5.11)
RDW: 13.6 % (ref 11.5–15.5)
WBC: 9.4 10*3/uL (ref 4.0–10.5)

## 2015-02-18 LAB — CK: CK TOTAL: 224 U/L (ref 38–234)

## 2015-02-18 MED ORDER — ACETAMINOPHEN 325 MG PO TABS
650.0000 mg | ORAL_TABLET | Freq: Three times a day (TID) | ORAL | Status: DC
  Administered 2015-02-18 – 2015-02-22 (×10): 650 mg via ORAL
  Filled 2015-02-18 (×11): qty 2

## 2015-02-18 MED ORDER — FENTANYL CITRATE (PF) 100 MCG/2ML IJ SOLN: 12.5000 ug | INTRAMUSCULAR | Status: DC | PRN

## 2015-02-18 MED ORDER — DOCUSATE SODIUM 100 MG PO CAPS
100.0000 mg | ORAL_CAPSULE | Freq: Two times a day (BID) | ORAL | Status: DC
  Administered 2015-02-18 – 2015-02-22 (×8): 100 mg via ORAL
  Filled 2015-02-18 (×10): qty 1

## 2015-02-18 MED ORDER — TRAMADOL HCL 50 MG PO TABS
100.0000 mg | ORAL_TABLET | Freq: Four times a day (QID) | ORAL | Status: DC | PRN
  Administered 2015-02-18 – 2015-02-21 (×3): 100 mg via ORAL
  Filled 2015-02-18 (×5): qty 2

## 2015-02-18 MED ORDER — SENNA 8.6 MG PO TABS
1.0000 | ORAL_TABLET | Freq: Every day | ORAL | Status: DC
  Administered 2015-02-18 – 2015-02-21 (×4): 8.6 mg via ORAL
  Filled 2015-02-18 (×4): qty 1

## 2015-02-18 MED ORDER — METOPROLOL TARTRATE 100 MG PO TABS
100.0000 mg | ORAL_TABLET | Freq: Two times a day (BID) | ORAL | Status: DC
  Administered 2015-02-18 – 2015-02-20 (×4): 100 mg via ORAL
  Filled 2015-02-18 (×5): qty 1

## 2015-02-18 MED ORDER — MAGNESIUM HYDROXIDE 400 MG/5ML PO SUSP
30.0000 mL | Freq: Every day | ORAL | Status: DC | PRN
  Administered 2015-02-21: 30 mL via ORAL
  Filled 2015-02-18: qty 30

## 2015-02-18 MED ORDER — METOCLOPRAMIDE HCL 5 MG/ML IJ SOLN
5.0000 mg | Freq: Four times a day (QID) | INTRAMUSCULAR | Status: DC | PRN
  Administered 2015-02-18: 5 mg via INTRAVENOUS
  Filled 2015-02-18: qty 2

## 2015-02-18 NOTE — Progress Notes (Signed)
[facility] PROGRESS NOTE  Brianna Thompson IOM:[id number] DOB: 04/11/1922 DOA: 02/17/2015 PCP: Leonard Downing, MD  Brief Summary  Brianna Thompson is a 81 y.o. female with history of atrial fibrillation on xarelto, chronic systolic CHF, diabetes mellitus, hypertension, hypothyroidism, dyslipidemia who presented with a mechanical fall.  She tripped on a stool and fell to the ground hitting her head and the left side of her body.  Denies LOC.  She lay for 2 hours before assistance arrived.  In the ER, she was found to have left hip fracture.  CT head and cervical spine were stable.  CXR and UA did not demonstrated infection.     Assessment/Plan  Closed left hip fracture (HCC) after mechanical fall -  Appreciate orthopedic assistance -  Last xarelto was Thursday evening -  DVT proph with SCDs -  Pain medication causing nausea and vomiting > will try ultram and fentanyl  -  Patient is moderate to high risk for surgery given age, heart failure -  NPO at [facility] for surgery Sunday  Nausea and vomiting likely secondary to pain medication -  LFTs wnl -  UA neg -  Check lipase -  Change pain medications as above -  Did not improve with zofran -  Trial of reglan  Atrial fibrillation ([place]), CHA2DS2-VASc Score 6, rate controlled. -  Hold A/C prior to surgery and will resume 24 hours post surgery  Chronic diastolic CHF, appears euvolemic -  Judicious use of IVF -  Hold lasix  Hypertension with low normal BPs -  Hold ACEI, lasix -  Continue BB -  F/u echo  Hypokalemia, resolved with oral repletion  Hypothyroidism, stable, continue synthroid  Minimally elevated CPK, resolved with IVF.  Glaucoma, stable, continue azopt, alphagan, timoptic  Normocytic anemia -  Iron studies, B12, folate -  TSH -  Repeat hgb in AM  Diet:  Healthy heart Access:  PIV IVF:  yes Proph:  SCDs  Code Status:  DNR  Family Communication: patient alone Disposition Plan: to SNF a few days after  surgery most likely.     Consultants:  Orthopedic surgery, Dr. Erlinda Hong  Procedures:  none  Antibiotics:  none   HPI/Subjective:  Patient denies leg pain.  Denies SOB, chest pains.  Has some dyspnea when walking to the end of her driveway.  Lives independently and daughter-in-law assists her with grocery shopping.  Cane for ambulation.  Nausea with vomiting since admission.  Denies diarrhea.  Typically has problems with constipation.     Objective: Filed Vitals:   02/17/15 2030 02/17/15 2140 02/18/15 0430 02/18/15 1000  BP: 139/94 158/83 146/81   Pulse: 87 92 84 94  Temp:  97.9 F (36.6 C) 97.7 F (36.5 C)   TempSrc:  Oral Oral   SpO2: 100% 98% 97%    No intake or output data in the 24 hours ending 02/18/15 1116 There were no vitals filed for this visit. There is no height or weight on file to calculate BMI.  Exam:   General:  Thin female, No acute distress  HEENT:  NCAT, MMM  Cardiovascular:  RRR, nl S1, S2 no mrg, 2+ pulses, warm extremities  Respiratory:  CTAB, no increased WOB  Abdomen:   NABS, soft, NT/ND  MSK:   Normal tone and bulk, left lower extremity shortened several inches and externally rotated.  TTP along lateral aspect of hip.  Warm, < 2 sec CR, 2+ pedal pulse left foot  Neuro:  Grossly intact, sensation intact  and able to wiggle toes on the left foot.  Data Reviewed: Basic Metabolic Panel:  Recent Labs Lab 02/17/15 1725 02/18/15 0406  NA 142 138  K 3.2* 4.0  CL 102 102  CO2 25 28  GLUCOSE 192* 141*  BUN 17 15  CREATININE 0.96 0.90  CALCIUM 9.8 8.9   Liver Function Tests:  Recent Labs Lab 02/18/15 0406  AST 24  ALT 19  ALKPHOS 83  BILITOT 0.6  PROT 6.1*  ALBUMIN 3.1*   No results for input(s): LIPASE, AMYLASE in the last 168 hours. No results for input(s): AMMONIA in the last 168 hours. CBC:  Recent Labs Lab 02/17/15 1725 02/18/15 0406  WBC 10.5 9.4  NEUTROABS  --  7.9*  HGB 11.7* 10.3*  HCT 36.3 32.5*  MCV 99.2 98.2   PLT 248 240    No results found for this or any previous visit (from the past 240 hour(s)).   Studies: Dg Chest 1 View  02/17/2015  CLINICAL DATA:  81 year old who fell and sustained a comminuted intertrochanteric left femoral neck fracture. Preoperative evaluation. EXAM: CHEST 1 VIEW COMPARISON:  None. FINDINGS: Cardiac silhouette moderately enlarged. Thoracic aorta atherosclerotic. Hilar and mediastinal contours otherwise unremarkable. Mildly prominent bronchovascular markings diffusely. Lungs otherwise clear. No localized airspace consolidation. No pleural effusions. No pneumothorax. Normal pulmonary vascularity. Prominent paracardiac fat pad on the left. IMPRESSION: Cardiomegaly.  No acute cardiopulmonary disease. Electronically Signed   By: Evangeline Dakin M.D.   On: 02/17/2015 18:16   Dg Lumbar Spine Complete  02/17/2015  CLINICAL DATA:  Fall with low back pain EXAM: LUMBAR SPINE - COMPLETE 4+ VIEW COMPARISON:  None. FINDINGS: This report assumes 5 non rib-bearing lumbar vertebrae. Mild levocurvature of the lumbar spine. Lumbar vertebral body heights are preserved, with no fracture or suspicious focal osseous lesion. Mild-to-moderate degenerative disc disease throughout the lumbar spine, most prominent at L3-4. No spondylolisthesis. Moderate facet arthropathy bilaterally in the lower lumbar spine. Atherosclerotic calcifications throughout the abdominal aorta. IMPRESSION: 1. No lumbar spine fracture or spondylolisthesis. 2. Moderate degenerative changes as described. Electronically Signed   By: Ilona Sorrel M.D.   On: 02/17/2015 18:10   Ct Head Wo Contrast  02/17/2015  CLINICAL DATA:  Fall.  Complaining of headache and neck pain. EXAM: CT HEAD WITHOUT CONTRAST CT CERVICAL SPINE WITHOUT CONTRAST TECHNIQUE: Multidetector CT imaging of the head and cervical spine was performed following the standard protocol without intravenous contrast. Multiplanar CT image reconstructions of the cervical spine  were also generated. COMPARISON:  None. FINDINGS: CT HEAD FINDINGS New ventricles are normal configuration. There is ventricular and sulcal enlargement reflecting moderate atrophy. There are no parenchymal masses or mass effect. There is no evidence a cortical infarct. Minor periventricular white matter hypoattenuation is noted consistent chronic microvascular ischemic change. There are no extra-axial masses or abnormal fluid collections. There is no intracranial hemorrhage. Right maxillary sinus is opacified. There is associated wall thickening. This is chronic. Remaining sinuses are clear as are the mastoid air cells. No skull fracture. CT CERVICAL SPINE FINDINGS No fracture. There is slight anterolisthesis of C3 and C4, C4-C5 and C5-C6. There is moderate loss disc height at C6-C7. Small endplate spurs are noted along the mid and lower cervical spine. There is facet degenerative change bilaterally, most evident in the mid cervical spine. Bones are diffusely demineralized. The central spinal canal and neural foramina are well preserved. Soft tissue evaluation demonstrates multiple thyroid nodules, many with calcifications. A 16 mm nodules noted on the left.  The other nodules are less well-defined. There are not dense carotid vascular calcifications. Lung apices show mild scarring but are otherwise clear. IMPRESSION: HEAD CT:  No acute intracranial abnormalities. CERVICAL CT:  No fracture or acute finding. Electronically Signed   By: Lajean Manes M.D.   On: 02/17/2015 19:00   Ct Cervical Spine Wo Contrast  02/17/2015  CLINICAL DATA:  Fall.  Complaining of headache and neck pain. EXAM: CT HEAD WITHOUT CONTRAST CT CERVICAL SPINE WITHOUT CONTRAST TECHNIQUE: Multidetector CT imaging of the head and cervical spine was performed following the standard protocol without intravenous contrast. Multiplanar CT image reconstructions of the cervical spine were also generated. COMPARISON:  None. FINDINGS: CT HEAD FINDINGS New  ventricles are normal configuration. There is ventricular and sulcal enlargement reflecting moderate atrophy. There are no parenchymal masses or mass effect. There is no evidence a cortical infarct. Minor periventricular white matter hypoattenuation is noted consistent chronic microvascular ischemic change. There are no extra-axial masses or abnormal fluid collections. There is no intracranial hemorrhage. Right maxillary sinus is opacified. There is associated wall thickening. This is chronic. Remaining sinuses are clear as are the mastoid air cells. No skull fracture. CT CERVICAL SPINE FINDINGS No fracture. There is slight anterolisthesis of C3 and C4, C4-C5 and C5-C6. There is moderate loss disc height at C6-C7. Small endplate spurs are noted along the mid and lower cervical spine. There is facet degenerative change bilaterally, most evident in the mid cervical spine. Bones are diffusely demineralized. The central spinal canal and neural foramina are well preserved. Soft tissue evaluation demonstrates multiple thyroid nodules, many with calcifications. A 16 mm nodules noted on the left. The other nodules are less well-defined. There are not dense carotid vascular calcifications. Lung apices show mild scarring but are otherwise clear. IMPRESSION: HEAD CT:  No acute intracranial abnormalities. CERVICAL CT:  No fracture or acute finding. Electronically Signed   By: Lajean Manes M.D.   On: 02/17/2015 19:00   Dg Hip Unilat With Pelvis 2-3 Views Left  02/17/2015  CLINICAL DATA:  81 year old female with history of trauma from a fall complaining of left-sided hip pain. EXAM: DG HIP (WITH OR WITHOUT PELVIS) 2-3V LEFT COMPARISON:  No priors. FINDINGS: Three views of the bony pelvis and left hip demonstrate a comminuted intertrochanteric fracture of the left hip with some proximal migration of the distal fracture fragments, and approximately 30 degrees of varus angulation. There is wide distraction of multiple fracture  fragments, particularly the lesser trochanteric fracture fragment which is approximately 1.5 cm medially displaced. Bony pelvis appears grossly intact, as does the visualized portions of the right proximal femur. Left femoral head remains located. IMPRESSION: 1. Highly comminuted, displaced and angulated left intertrochanteric hip fracture, as above. Electronically Signed   By: Vinnie Langton M.D.   On: 02/17/2015 18:09    Scheduled Meds: . brimonidine  1 drop Both Eyes BID  . brinzolamide  1 drop Both Eyes BID  . doxazosin  2 mg Oral QHS  . ferrous sulfate  325 mg Oral TID PC  . levothyroxine  50 mcg Oral QAC breakfast  . metoprolol  50 mg Oral BID  . timolol  1 drop Both Eyes BID   Continuous Infusions: . sodium chloride 50 mL/hr at 02/17/15 2306    Principal Problem:   Closed left hip fracture [facility]) Active Problems:   Atrial fibrillation (HCC), CHA2DS2-VASc Score 6   Cardiomegaly   Essential hypertension   Fall   Hypokalemia   Traumatic hematoma  of left hand   Hypothyroidism   Diastolic dysfunction with chronic heart failure (HCC)   Elevated CPK   Glaucoma    Time spent: 30 min    Amayah Staheli, [facility] Pager [phone]. If 7PM-7AM, please contact night-coverage at [email or web address], password [facility] 02/18/2015, 11:16 AM  LOS: 1 day

## 2015-02-18 NOTE — Consult Note (Signed)
ORTHOPAEDIC CONSULTATION  REQUESTING PHYSICIAN: Janece Canterbury, MD  Chief Complaint: left hip fracture  HPI: Brianna Thompson is a 81 y.o. female who presents with left hip fracture s/p mechanical fall.  The patient endorses severe pain in the left hip, that does not radiate, grinding in quality, worse with any movement, better with immobilization.  Denies LOC/fever/chills/nausea/vomiting.  Walks with assistive devices (walker, cane, wheelchair).  Does not live indepedently.  Past Medical History  Diagnosis Date  . Irregular heart rhythm   . Thyroid disease   . Hypothyroidism   . Hypertension   . Diabetes mellitus without complication ([place])   . Atrial fibrillation ([place])   . CHF (congestive heart failure) ([place])   . Hyperlipidemia   . Glaucoma 02/17/2015   Past Surgical History  Procedure Laterality Date  . Appendectomy    . Abdominal hysterectomy     Social History   Social History  . Marital Status: Widowed    Spouse Name: N/A  . Number of Children: N/A  . Years of Education: N/A   Social History Main Topics  . Smoking status: Never Smoker   . Smokeless tobacco: Never Used  . Alcohol Use: No  . Drug Use: No  . Sexual Activity: Not Asked   Other Topics Concern  . None   Social History Narrative  . None   History reviewed. No pertinent family history. No Known Allergies Prior to Admission medications   Medication Sig Start Date End Date Taking? Authorizing Provider  Brinzolamide-Brimonidine [facility]) 1-0.2 % SUSP Apply 1 drop to eye 2 (two) times daily.   Yes Historical Provider, MD  doxazosin (CARDURA) 2 MG tablet Take 2 mg by mouth at bedtime.    Yes Historical Provider, MD  furosemide (LASIX) 40 MG tablet Take 20 mg by mouth daily.   Yes Historical Provider, MD  ibuprofen (ADVIL,MOTRIN) 200 MG tablet Take 400 mg by mouth every 6 (six) hours as needed for moderate pain.   Yes Historical Provider, MD  levothyroxine (SYNTHROID, LEVOTHROID) 50 MCG tablet Take  50 mcg by mouth daily before breakfast.   Yes Historical Provider, MD  lisinopril (PRINIVIL,ZESTRIL) 20 MG tablet Take 20 mg by mouth every 12 (twelve) hours.   Yes Historical Provider, MD  lovastatin (MEVACOR) 20 MG tablet Take 20 mg by mouth daily at 6 PM.   Yes Historical Provider, MD  metoprolol (LOPRESSOR) 100 MG tablet Take 100 mg by mouth 2 (two) times daily.   Yes Historical Provider, MD  Multiple Vitamins-Minerals (PRESERVISION AREDS 2) CAPS Take 1 capsule by mouth 2 (two) times daily.   Yes Historical Provider, MD  rivaroxaban (XARELTO) 20 MG TABS tablet Take 20 mg by mouth daily with supper.   Yes Historical Provider, MD  timolol (BETIMOL) 0.5 % ophthalmic solution Place 1 drop into both eyes 2 (two) times daily.   Yes Historical Provider, MD  traMADol (ULTRAM) 50 MG tablet Take 12.5 mg by mouth 3 (three) times daily.    Yes Historical Provider, MD   Dg Chest 1 View  02/17/2015  CLINICAL DATA:  81 year old who fell and sustained a comminuted intertrochanteric left femoral neck fracture. Preoperative evaluation. EXAM: CHEST 1 VIEW COMPARISON:  None. FINDINGS: Cardiac silhouette moderately enlarged. Thoracic aorta atherosclerotic. Hilar and mediastinal contours otherwise unremarkable. Mildly prominent bronchovascular markings diffusely. Lungs otherwise clear. No localized airspace consolidation. No pleural effusions. No pneumothorax. Normal pulmonary vascularity. Prominent paracardiac fat pad on the left. IMPRESSION: Cardiomegaly.  No acute cardiopulmonary disease. Electronically Signed  By: Evangeline Dakin M.D.   On: 02/17/2015 18:16   Dg Lumbar Spine Complete  02/17/2015  CLINICAL DATA:  Fall with low back pain EXAM: LUMBAR SPINE - COMPLETE 4+ VIEW COMPARISON:  None. FINDINGS: This report assumes 5 non rib-bearing lumbar vertebrae. Mild levocurvature of the lumbar spine. Lumbar vertebral body heights are preserved, with no fracture or suspicious focal osseous lesion. Mild-to-moderate  degenerative disc disease throughout the lumbar spine, most prominent at L3-4. No spondylolisthesis. Moderate facet arthropathy bilaterally in the lower lumbar spine. Atherosclerotic calcifications throughout the abdominal aorta. IMPRESSION: 1. No lumbar spine fracture or spondylolisthesis. 2. Moderate degenerative changes as described. Electronically Signed   By: Ilona Sorrel M.D.   On: 02/17/2015 18:10   Ct Head Wo Contrast  02/17/2015  CLINICAL DATA:  Fall.  Complaining of headache and neck pain. EXAM: CT HEAD WITHOUT CONTRAST CT CERVICAL SPINE WITHOUT CONTRAST TECHNIQUE: Multidetector CT imaging of the head and cervical spine was performed following the standard protocol without intravenous contrast. Multiplanar CT image reconstructions of the cervical spine were also generated. COMPARISON:  None. FINDINGS: CT HEAD FINDINGS New ventricles are normal configuration. There is ventricular and sulcal enlargement reflecting moderate atrophy. There are no parenchymal masses or mass effect. There is no evidence a cortical infarct. Minor periventricular white matter hypoattenuation is noted consistent chronic microvascular ischemic change. There are no extra-axial masses or abnormal fluid collections. There is no intracranial hemorrhage. Right maxillary sinus is opacified. There is associated wall thickening. This is chronic. Remaining sinuses are clear as are the mastoid air cells. No skull fracture. CT CERVICAL SPINE FINDINGS No fracture. There is slight anterolisthesis of C3 and C4, C4-C5 and C5-C6. There is moderate loss disc height at C6-C7. Small endplate spurs are noted along the mid and lower cervical spine. There is facet degenerative change bilaterally, most evident in the mid cervical spine. Bones are diffusely demineralized. The central spinal canal and neural foramina are well preserved. Soft tissue evaluation demonstrates multiple thyroid nodules, many with calcifications. A 16 mm nodules noted on the  left. The other nodules are less well-defined. There are not dense carotid vascular calcifications. Lung apices show mild scarring but are otherwise clear. IMPRESSION: HEAD CT:  No acute intracranial abnormalities. CERVICAL CT:  No fracture or acute finding. Electronically Signed   By: Lajean Manes M.D.   On: 02/17/2015 19:00   Ct Cervical Spine Wo Contrast  02/17/2015  CLINICAL DATA:  Fall.  Complaining of headache and neck pain. EXAM: CT HEAD WITHOUT CONTRAST CT CERVICAL SPINE WITHOUT CONTRAST TECHNIQUE: Multidetector CT imaging of the head and cervical spine was performed following the standard protocol without intravenous contrast. Multiplanar CT image reconstructions of the cervical spine were also generated. COMPARISON:  None. FINDINGS: CT HEAD FINDINGS New ventricles are normal configuration. There is ventricular and sulcal enlargement reflecting moderate atrophy. There are no parenchymal masses or mass effect. There is no evidence a cortical infarct. Minor periventricular white matter hypoattenuation is noted consistent chronic microvascular ischemic change. There are no extra-axial masses or abnormal fluid collections. There is no intracranial hemorrhage. Right maxillary sinus is opacified. There is associated wall thickening. This is chronic. Remaining sinuses are clear as are the mastoid air cells. No skull fracture. CT CERVICAL SPINE FINDINGS No fracture. There is slight anterolisthesis of C3 and C4, C4-C5 and C5-C6. There is moderate loss disc height at C6-C7. Small endplate spurs are noted along the mid and lower cervical spine. There is facet degenerative change bilaterally, most  evident in the mid cervical spine. Bones are diffusely demineralized. The central spinal canal and neural foramina are well preserved. Soft tissue evaluation demonstrates multiple thyroid nodules, many with calcifications. A 16 mm nodules noted on the left. The other nodules are less well-defined. There are not dense  carotid vascular calcifications. Lung apices show mild scarring but are otherwise clear. IMPRESSION: HEAD CT:  No acute intracranial abnormalities. CERVICAL CT:  No fracture or acute finding. Electronically Signed   By: Lajean Manes M.D.   On: 02/17/2015 19:00   Dg Hip Unilat With Pelvis 2-3 Views Left  02/17/2015  CLINICAL DATA:  81 year old female with history of trauma from a fall complaining of left-sided hip pain. EXAM: DG HIP (WITH OR WITHOUT PELVIS) 2-3V LEFT COMPARISON:  No priors. FINDINGS: Three views of the bony pelvis and left hip demonstrate a comminuted intertrochanteric fracture of the left hip with some proximal migration of the distal fracture fragments, and approximately 30 degrees of varus angulation. There is wide distraction of multiple fracture fragments, particularly the lesser trochanteric fracture fragment which is approximately 1.5 cm medially displaced. Bony pelvis appears grossly intact, as does the visualized portions of the right proximal femur. Left femoral head remains located. IMPRESSION: 1. Highly comminuted, displaced and angulated left intertrochanteric hip fracture, as above. Electronically Signed   By: Vinnie Langton M.D.   On: 02/17/2015 18:09    Positive ROS: All other systems have been reviewed and were otherwise negative with the exception of those mentioned in the HPI and as above.  Physical Exam: General: Alert, no acute distress Cardiovascular: No pedal edema Respiratory: No cyanosis, no use of accessory musculature GI: No organomegaly, abdomen is soft and non-tender Skin: No lesions in the area of chief complaint Neurologic: Sensation intact distally Psychiatric: Patient is competent for consent with normal mood and affect Lymphatic: No axillary or cervical lymphadenopathy  MUSCULOSKELETAL:  - severe pain with movement of the hip and extremity - skin intact - NVI distally - compartments soft  Assessment: left hip fracture  Plan: - surgery  is recommended, patient and family are aware of r/b/a and wish to proceed - consent obtained - medical optimization per primary team - surgery is planned for tomorrow - echo ordered for today - xarelto held and will restart 24 hrs postop  Thank you for the consult and the opportunity to see Ms. Leanne Lovely, MD [place] 10:03 AM

## 2015-02-19 ENCOUNTER — Encounter (HOSPITAL_COMMUNITY): Payer: Self-pay | Admitting: Anesthesiology

## 2015-02-19 ENCOUNTER — Inpatient Hospital Stay (HOSPITAL_COMMUNITY): Payer: Medicare Other | Admitting: Anesthesiology

## 2015-02-19 ENCOUNTER — Encounter (HOSPITAL_COMMUNITY)
Admission: EM | Disposition: A | Discharge status: Skilled Nursing Facility | Payer: Self-pay | Source: Home / Self Care | Attending: Internal Medicine

## 2015-02-19 ENCOUNTER — Inpatient Hospital Stay (HOSPITAL_COMMUNITY): Payer: Medicare Other

## 2015-02-19 HISTORY — PX: INTRAMEDULLARY (IM) NAIL INTERTROCHANTERIC: SHX5875

## 2015-02-19 LAB — SURGICAL PCR SCREEN
MRSA, PCR: NEGATIVE
Staphylococcus aureus: NEGATIVE

## 2015-02-19 LAB — BASIC METABOLIC PANEL
ANION GAP: 8 (ref 5–15)
BUN: 15 mg/dL (ref 6–20)
CHLORIDE: 103 mmol/L (ref 101–111)
CO2: 29 mmol/L (ref 22–32)
Calcium: 9.2 mg/dL (ref 8.9–10.3)
Creatinine, Ser: 0.94 mg/dL (ref 0.44–1.00)
GFR calc non Af Amer: 51 mL/min — ABNORMAL LOW (ref >60)
GFR, EST AFRICAN AMERICAN: 60 mL/min — AB (ref >60)
Glucose, Bld: 102 mg/dL — ABNORMAL HIGH (ref 65–99)
POTASSIUM: 3.7 mmol/L (ref 3.5–5.1)
Sodium: 140 mmol/L (ref 135–145)

## 2015-02-19 LAB — IRON AND TIBC
IRON: 24 ug/dL — AB (ref 28–170)
Saturation Ratios: 9 % — ABNORMAL LOW (ref 10.4–31.8)
TIBC: 253 ug/dL (ref 250–450)
UIBC: 229 ug/dL

## 2015-02-19 LAB — CBC
HCT: 31.8 % — ABNORMAL LOW (ref 36.0–46.0)
HEMOGLOBIN: 10 g/dL — AB (ref 12.0–15.0)
MCH: 31.3 pg (ref 26.0–34.0)
MCHC: 31.4 g/dL (ref 30.0–36.0)
MCV: 99.7 fL (ref 78.0–100.0)
Platelets: 238 10*3/uL (ref 150–400)
RBC: 3.19 MIL/uL — AB (ref 3.87–5.11)
RDW: 13.9 % (ref 11.5–15.5)
WBC: 9.1 10*3/uL (ref 4.0–10.5)

## 2015-02-19 LAB — FOLATE: FOLATE: 16.2 ng/mL (ref >5.9)

## 2015-02-19 LAB — TSH: TSH: 1.407 u[IU]/mL (ref 0.350–4.500)

## 2015-02-19 LAB — VITAMIN B12: Vitamin B-12: 411 pg/mL (ref 180–914)

## 2015-02-19 LAB — FERRITIN: FERRITIN: 50 ng/mL (ref 11–307)

## 2015-02-19 SURGERY — FIXATION, FRACTURE, INTERTROCHANTERIC, WITH INTRAMEDULLARY ROD
Anesthesia: Monitor Anesthesia Care | Site: Hip | Laterality: Left

## 2015-02-19 MED ORDER — RIVAROXABAN 20 MG PO TABS
20.0000 mg | ORAL_TABLET | Freq: Every day | ORAL | Status: DC
  Administered 2015-02-20: 20 mg via ORAL
  Filled 2015-02-19: qty 1

## 2015-02-19 MED ORDER — MORPHINE SULFATE (PF) 2 MG/ML IV SOLN: 0.5000 mg | INTRAVENOUS | Status: DC | PRN

## 2015-02-19 MED ORDER — ONDANSETRON HCL 4 MG/2ML IJ SOLN
INTRAMUSCULAR | Status: AC
  Filled 2015-02-19: qty 2

## 2015-02-19 MED ORDER — OXYCODONE HCL 5 MG PO TABS
5.0000 mg | ORAL_TABLET | ORAL | Status: DC | PRN
  Administered 2015-02-21: 5 mg via ORAL
  Filled 2015-02-19: qty 1

## 2015-02-19 MED ORDER — HYDROCODONE-ACETAMINOPHEN 5-325 MG PO TABS: 1.0000 | ORAL_TABLET | Freq: Four times a day (QID) | ORAL | Status: DC | PRN

## 2015-02-19 MED ORDER — LACTATED RINGERS IV SOLN
INTRAVENOUS | Status: DC | PRN
  Administered 2015-02-19: 07:00:00 via INTRAVENOUS

## 2015-02-19 MED ORDER — SUCCINYLCHOLINE CHLORIDE 20 MG/ML IJ SOLN
INTRAMUSCULAR | Status: DC | PRN
  Administered 2015-02-19: 100 mg via INTRAVENOUS

## 2015-02-19 MED ORDER — 0.9 % SODIUM CHLORIDE (POUR BTL) OPTIME
TOPICAL | Status: DC | PRN
  Administered 2015-02-19: 1000 mL

## 2015-02-19 MED ORDER — METHOCARBAMOL 1000 MG/10ML IJ SOLN: 500.0000 mg | Freq: Four times a day (QID) | INTRAVENOUS | Status: DC | PRN

## 2015-02-19 MED ORDER — LIDOCAINE HCL (CARDIAC) 20 MG/ML IV SOLN
INTRAVENOUS | Status: AC
  Filled 2015-02-19: qty 5

## 2015-02-19 MED ORDER — ESMOLOL HCL 100 MG/10ML IV SOLN
INTRAVENOUS | Status: DC | PRN
  Administered 2015-02-19: 30 mg via INTRAVENOUS

## 2015-02-19 MED ORDER — PHENOL 1.4 % MT LIQD: 1.0000 | OROMUCOSAL | Status: DC | PRN

## 2015-02-19 MED ORDER — FENTANYL CITRATE (PF) 250 MCG/5ML IJ SOLN
INTRAMUSCULAR | Status: AC
  Filled 2015-02-19: qty 5

## 2015-02-19 MED ORDER — ALUM & MAG HYDROXIDE-SIMETH 200-200-20 MG/5ML PO SUSP: 30.0000 mL | ORAL | Status: DC | PRN

## 2015-02-19 MED ORDER — METOCLOPRAMIDE HCL 5 MG PO TABS: 5.0000 mg | ORAL_TABLET | Freq: Three times a day (TID) | ORAL | Status: DC | PRN

## 2015-02-19 MED ORDER — ARTIFICIAL TEARS OP OINT
TOPICAL_OINTMENT | OPHTHALMIC | Status: AC
  Filled 2015-02-19: qty 3.5

## 2015-02-19 MED ORDER — FENTANYL CITRATE (PF) 100 MCG/2ML IJ SOLN
INTRAMUSCULAR | Status: DC | PRN
  Administered 2015-02-19: 50 ug via INTRAVENOUS
  Administered 2015-02-19: 100 ug via INTRAVENOUS

## 2015-02-19 MED ORDER — LIDOCAINE HCL (CARDIAC) 20 MG/ML IV SOLN
INTRAVENOUS | Status: DC | PRN
  Administered 2015-02-19: 80 mg via INTRAVENOUS

## 2015-02-19 MED ORDER — ONDANSETRON HCL 4 MG PO TABS: 4.0000 mg | ORAL_TABLET | Freq: Four times a day (QID) | ORAL | Status: DC | PRN

## 2015-02-19 MED ORDER — CEFAZOLIN SODIUM-DEXTROSE 2-3 GM-% IV SOLR
INTRAVENOUS | Status: DC | PRN
  Administered 2015-02-19: 2 g via INTRAVENOUS

## 2015-02-19 MED ORDER — OXYCODONE HCL 5 MG PO TABS: 5.0000 mg | ORAL_TABLET | ORAL | Status: DC | PRN

## 2015-02-19 MED ORDER — CEFAZOLIN SODIUM-DEXTROSE 2-3 GM-% IV SOLR
2.0000 g | Freq: Four times a day (QID) | INTRAVENOUS | Status: AC
  Administered 2015-02-19 – 2015-02-20 (×3): 2 g via INTRAVENOUS
  Filled 2015-02-19 (×3): qty 50

## 2015-02-19 MED ORDER — OXYCODONE-ACETAMINOPHEN 5-325 MG PO TABS: 1.0000 | ORAL_TABLET | ORAL | Status: DC | PRN

## 2015-02-19 MED ORDER — RIVAROXABAN 10 MG PO TABS: 20.0000 mg | ORAL_TABLET | Freq: Every day | ORAL | Status: DC

## 2015-02-19 MED ORDER — PROPOFOL 10 MG/ML IV BOLUS
INTRAVENOUS | Status: DC | PRN
  Administered 2015-02-19: 70 mg via INTRAVENOUS

## 2015-02-19 MED ORDER — SODIUM CHLORIDE 0.9 % IV SOLN: INTRAVENOUS | Status: DC

## 2015-02-19 MED ORDER — ONDANSETRON HCL 4 MG/2ML IJ SOLN
4.0000 mg | Freq: Four times a day (QID) | INTRAMUSCULAR | Status: DC | PRN
  Administered 2015-02-19 – 2015-02-20 (×2): 4 mg via INTRAVENOUS
  Filled 2015-02-19: qty 2

## 2015-02-19 MED ORDER — METOCLOPRAMIDE HCL 5 MG/ML IJ SOLN: 5.0000 mg | Freq: Three times a day (TID) | INTRAMUSCULAR | Status: DC | PRN

## 2015-02-19 MED ORDER — ACETAMINOPHEN 325 MG PO TABS: 650.0000 mg | ORAL_TABLET | Freq: Four times a day (QID) | ORAL | Status: DC | PRN

## 2015-02-19 MED ORDER — MENTHOL 3 MG MT LOZG: 1.0000 | LOZENGE | OROMUCOSAL | Status: DC | PRN

## 2015-02-19 MED ORDER — METHOCARBAMOL 500 MG PO TABS
500.0000 mg | ORAL_TABLET | Freq: Four times a day (QID) | ORAL | Status: DC | PRN
  Administered 2015-02-19: 500 mg via ORAL

## 2015-02-19 MED ORDER — CEFAZOLIN SODIUM-DEXTROSE 2-3 GM-% IV SOLR
INTRAVENOUS | Status: AC
  Filled 2015-02-19: qty 50

## 2015-02-19 MED ORDER — ONDANSETRON HCL 4 MG/2ML IJ SOLN
INTRAMUSCULAR | Status: DC | PRN
  Administered 2015-02-19: 4 mg via INTRAVENOUS

## 2015-02-19 MED ORDER — ACETAMINOPHEN 650 MG RE SUPP: 650.0000 mg | Freq: Four times a day (QID) | RECTAL | Status: DC | PRN

## 2015-02-19 SURGICAL SUPPLY — 42 items
BLADE SURG 15 STRL LF DISP TIS (BLADE) ×1 IMPLANT
BLADE SURG 15 STRL SS (BLADE) ×1
BNDG COHESIVE 4X5 TAN NS LF (GAUZE/BANDAGES/DRESSINGS) IMPLANT
BNDG COHESIVE 6X5 TAN STRL LF (GAUZE/BANDAGES/DRESSINGS) IMPLANT
BNDG GAUZE ELAST 4 BULKY (GAUZE/BANDAGES/DRESSINGS) IMPLANT
COVER PERINEAL POST (MISCELLANEOUS) ×2 IMPLANT
COVER SURGICAL LIGHT HANDLE (MISCELLANEOUS) ×2 IMPLANT
DRAPE PROXIMA HALF (DRAPES) IMPLANT
DRAPE STERI IOBAN 125X83 (DRAPES) IMPLANT
DRSG MEPILEX BORDER 4X4 (GAUZE/BANDAGES/DRESSINGS) ×4 IMPLANT
DRSG MEPILEX BORDER 4X8 (GAUZE/BANDAGES/DRESSINGS) IMPLANT
DRSG PAD ABDOMINAL 8X10 ST (GAUZE/BANDAGES/DRESSINGS) IMPLANT
DURAPREP 26ML APPLICATOR (WOUND CARE) ×2 IMPLANT
ELECT CAUTERY BLADE 6.4 (BLADE) ×2 IMPLANT
ELECT REM PT RETURN 9FT ADLT (ELECTROSURGICAL) ×2
ELECTRODE REM PT RTRN 9FT ADLT (ELECTROSURGICAL) ×1 IMPLANT
FACESHIELD WRAPAROUND (MASK) IMPLANT
GAUZE XEROFORM 5X9 LF (GAUZE/BANDAGES/DRESSINGS) IMPLANT
GLOVE NEODERM STRL 7.5 LF PF (GLOVE) ×2 IMPLANT
GLOVE SURG NEODERM 7.5  LF PF (GLOVE) ×2
GOWN STRL REIN XL XLG (GOWN DISPOSABLE) ×2 IMPLANT
GUIDE PIN 3.2X343 (PIN) ×1
GUIDE PIN 3.2X343MM (PIN) ×1
KIT BASIN OR (CUSTOM PROCEDURE TRAY) ×2 IMPLANT
KIT ROOM TURNOVER OR (KITS) ×2 IMPLANT
LINER BOOT UNIVERSAL DISP (MISCELLANEOUS) ×2 IMPLANT
MANIFOLD NEPTUNE II (INSTRUMENTS) IMPLANT
NAIL TRIGEN LEFT 10X38-125 (Nail) ×2 IMPLANT
NS IRRIG 1000ML POUR BTL (IV SOLUTION) ×2 IMPLANT
PACK GENERAL/GYN (CUSTOM PROCEDURE TRAY) ×2 IMPLANT
PAD ARMBOARD 7.5X6 YLW CONV (MISCELLANEOUS) ×4 IMPLANT
PAD CAST 4YDX4 CTTN HI CHSV (CAST SUPPLIES) IMPLANT
PADDING CAST COTTON 4X4 STRL (CAST SUPPLIES)
PIN GUIDE 3.2X343MM (PIN) ×1 IMPLANT
STAPLER VISISTAT 35W (STAPLE) ×2 IMPLANT
SUT VIC AB 0 CT1 27 (SUTURE) ×1
SUT VIC AB 0 CT1 27XBRD ANBCTR (SUTURE) ×1 IMPLANT
SUT VIC AB 2-0 CT1 27 (SUTURE) ×1
SUT VIC AB 2-0 CT1 TAPERPNT 27 (SUTURE) ×1 IMPLANT
TOWEL OR 17X24 6PK STRL BLUE (TOWEL DISPOSABLE) ×2 IMPLANT
TOWEL OR 17X26 10 PK STRL BLUE (TOWEL DISPOSABLE) ×2 IMPLANT
WATER STERILE IRR 1000ML POUR (IV SOLUTION) ×2 IMPLANT

## 2015-02-19 NOTE — Care Management Note (Addendum)
Case Management Note  Patient Details  Name: ARLONE LENHARDT MRN: [id number] Date of Birth: 19-Jul-1921  Subjective/Objective:                  INTRAMEDULLARY (IM) NAIL INTERTROCHANTRIC (Left)  Action/Plan: CM notes that patient had surgery today. PT/OT eval pending but patient is from ALF. CM notified SW, Tywan, of admission status and referral to SW.  Please refer back to CM for any additional needs that may arise.    Expected Discharge Date:                  Expected Discharge Plan:  Skilled Nursing Facility  In-House Referral:  Clinical Social Work  Discharge planning Services  CM Consult  Post Acute Care Choice:    Choice offered to:     DME Arranged:    DME Agency:     HH Arranged:    [place] Agency:     Status of Service:  In process, will continue to follow  Medicare Important Message Given:    Date Medicare IM Given:    Medicare IM give by:    Date Additional Medicare IM Given:    Additional Medicare Important Message give by:     If discussed at [place] of Stay Meetings, dates discussed:    Additional Comments:  Guido Sander, RN 02/19/2015, 3:04 PM

## 2015-02-19 NOTE — Anesthesia Preprocedure Evaluation (Addendum)
Anesthesia Evaluation  Patient identified by MRN, date of birth, ID band Patient confused    Reviewed: Allergy & Precautions, NPO status , Patient's Chart, lab work & pertinent test results, reviewed documented beta blocker date and time   History of Anesthesia Complications Negative for: history of anesthetic complications  Airway Mallampati: II  TM Distance: >3 FB Neck ROM: Full    Dental  (+) Lower Dentures, Upper Dentures, Dental Advisory Given   Pulmonary neg pulmonary ROS,    Pulmonary exam normal        Cardiovascular hypertension, Pt. on medications and Pt. on home beta blockers +CHF  Normal cardiovascular exam+ dysrhythmias  Rhythm:Irregular     Neuro/Psych negative neurological ROS  negative psych ROS   GI/Hepatic negative GI ROS, Neg liver ROS,   Endo/Other  diabetesHypothyroidism   Renal/GU negative Renal ROS     Musculoskeletal   Abdominal   Peds  Hematology   Anesthesia Other Findings   Reproductive/Obstetrics                           Anesthesia Physical Anesthesia Plan  ASA: III  Anesthesia Plan: MAC and Spinal   Post-op Pain Management:    Induction:   Airway Management Planned: Simple Face Mask  Additional Equipment:   Intra-op Plan:   Post-operative Plan:   Informed Consent: I have reviewed the patients History and Physical, chart, labs and discussed the procedure including the risks, benefits and alternatives for the proposed anesthesia with the patient or authorized representative who has indicated his/her understanding and acceptance.   Dental advisory given and Consent reviewed with POA  Plan Discussed with: CRNA, Anesthesiologist and Surgeon  Anesthesia Plan Comments:        Anesthesia Quick Evaluation

## 2015-02-19 NOTE — H&P (Signed)

## 2015-02-19 NOTE — Progress Notes (Signed)
Call from OR received stating that they'll gonna be late to pick up the patient for surgery. Will pass to oncoming nurse.

## 2015-02-19 NOTE — Op Note (Signed)
   Date of Surgery: 02/19/2015  INDICATIONS: Brianna Thompson is a 81 y.o.-year-old female who sustained a left hip fracture. The risks and benefits of the procedure discussed with the patient and family prior to the procedure and all questions were answered; consent was obtained.  PREOPERATIVE DIAGNOSIS: left hip fracture   POSTOPERATIVE DIAGNOSIS: Same   PROCEDURE: Treatment of intertrochanteric, pertrochanteric, subtrochanteric fracture with intramedullary implant. CPT [phone]   SURGEON: N. Eduard Roux, M.D.   ANESTHESIA: general   IV FLUIDS AND URINE: See anesthesia record   ESTIMATED BLOOD LOSS: 100 cc  IMPLANTS: [employer] InterTAN 10 x 38, 95/90  DRAINS: None.   COMPLICATIONS: None.   DESCRIPTION OF PROCEDURE: The patient was brought to the operating room and placed supine on the operating table. The patient's leg had been signed prior to the procedure. The patient had the anesthesia placed by the anesthesiologist. The prep verification and incision time-outs were performed to confirm that this was the correct patient, site, side and location. The patient had an SCD on the opposite lower extremity. The patient did receive antibiotics prior to the incision and was re-dosed during the procedure as needed at indicated intervals. The patient was positioned on the fracture table with the table in traction and internal rotation to reduce the hip. The well leg was placed in a scissor position and all bony prominences were well-padded. The patient had the lower extremity prepped and draped in the standard surgical fashion. The incision was made 4 finger breadths superior to the greater trochanter. A guide pin was inserted into the tip of the greater trochanter under fluoroscopic guidance. An opening reamer was used to gain access to the femoral canal. The nail length was measured and inserted down the femoral canal to its proper depth. The appropriate version of insertion for the lag screw was  found under fluoroscopy. A pin was inserted up the femoral neck through the jig. Then, a second antirotation pin was inserted inferior to the first pin. The length of the lag screw was then measured. The lag screw was inserted as near to center-center in the head as possible. The antirotation pin was then taken out and an interdigitating compression screw was placed in its place. The leg was taken out of traction, then the interdigitating compression screw was used to compress across the fracture. Compression was visualized on serial xrays. The wound was copiously irrigated with saline and the subcutaneous layer closed with 2.0 vicryl and the skin was reapproximated with staples. The wounds were cleaned and dried a final time and a sterile dressing was placed. The hip was taken through a range of motion at the end of the case under fluoroscopic imaging to visualize the approach-withdraw phenomenon and confirm implant length in the head. The patient was then awakened from anesthesia and taken to the recovery room in stable condition. All counts were correct at the end of the case.   POSTOPERATIVE PLAN: The patient will be weight bearing as tolerated and will return in 2 weeks for staple removal and the patient will receive DVT prophylaxis based on other medications, activity level, and risk ratio of bleeding to thrombosis.   Azucena Cecil, MD [place] 8:27 AM

## 2015-02-19 NOTE — Transfer of Care (Signed)
Immediate Anesthesia Transfer of Care Note  Patient: Brianna Thompson  Procedure(s) Performed: Procedure(s): INTRAMEDULLARY (IM) NAIL INTERTROCHANTRIC (Left)  Patient Location: PACU  Anesthesia Type:General  Level of Consciousness: awake, oriented, sedated, patient cooperative and responds to stimulation  Airway & Oxygen Therapy: Patient Spontanous Breathing and Patient connected to nasal cannula oxygen  Post-op Assessment: Report given to RN, Post -op Vital signs reviewed and stable, Patient moving all extremities and Patient moving all extremities X 4  Post vital signs: Reviewed and stable  Last Vitals:  Filed Vitals:   02/19/15 0551  BP: 156/84  Pulse: 100  Temp: 36.7 C  Resp: 16    Complications: No apparent anesthesia complications

## 2015-02-19 NOTE — Progress Notes (Signed)
[facility] PROGRESS NOTE  Brianna Thompson WNU:[id number] DOB: 1921-06-22 DOA: 02/17/2015 PCP: Leonard Downing, MD  Brief Summary  Brianna Thompson is a 81 y.o. female with history of atrial fibrillation on xarelto, chronic systolic CHF, diabetes mellitus, hypertension, hypothyroidism, dyslipidemia who presented with a mechanical fall.  She tripped on a stool and fell to the ground hitting her head and the left side of her body.  Denies LOC.  She lay for 2 hours before assistance arrived.  In the ER, she was found to have left hip fracture.  CT head and cervical spine were stable.  CXR and UA did not demonstrated infection.     Assessment/Plan  Closed left hip fracture (HCC) after mechanical fall -  Appreciate orthopedic assistance -  Resume xarelto 24 hours after surgery -  SCDs pending resumption of xarelto -  Continue ultram and fentanyl   Nausea and vomiting likely secondary to pain medication and improving -  LFTs wnl -  UA neg -  Lipase wnl -  Change pain medications as above  Atrial fibrillation ([place]), CHA2DS2-VASc Score 6, rate controlled. -  Resume a/c 24 hours post surgery  Chronic diastolic CHF, rales at bases on exam -  Judicious use of IVF  -  Hold lasix  Hypertension with low normal BPs -  Resume ACEI -  Hold lasix -  Continue BB -  F/u echo  Hypokalemia, resolved with oral repletion  Hypothyroidism, stable, continue synthroid  Minimally elevated CPK, resolved with IVF.  Glaucoma, stable, continue azopt, alphagan, timoptic  Normocytic anemia -  Iron studies suggest iron deficiency, B12 411 wnl, folate 16.2 wnl -  TSH 1.4 wnl -  Started iron supplementation  Diet:  Healthy heart Access:  PIV IVF:  yes Proph:  SCDs  Code Status:  DNR  Family Communication: patient and her extended family who were present at bedside Disposition Plan: to SNF a few days after surgery most likely.     Consultants:  Orthopedic surgery, Dr.  Erlinda Hong  Procedures:  none  Antibiotics:  none   HPI/Subjective:  Having pain in her leg.  States her nausea improved yesterday.    Objective: Filed Vitals:   02/19/15 0845 02/19/15 0900 02/19/15 0915 02/19/15 1003  BP: 136/78 150/69 153/78 153/91  Pulse: 73 90 86 92  Temp:   97.2 F (36.2 C) 97.5 F (36.4 C)  TempSrc:    Oral  Resp: 23 18 20 20   SpO2: 99% 100% 100% 99%    Intake/Output Summary (Last 24 hours) at 02/19/15 1432 Last data filed at 02/19/15 0830  Gross per 24 hour  Intake    620 ml  Output   1250 ml  Net   -630 ml   There were no vitals filed for this visit. There is no height or weight on file to calculate BMI.  Exam:   General:  Thin female, No acute distress  HEENT:  NCAT, MMM  Cardiovascular:  RRR, nl S1, S2 no mrg, 2+ pulses, warm extremities  Respiratory:  CTAB, no increased WOB  Abdomen:   NABS, soft, NT/ND  MSK:   Normal tone and bulk, left lower extremity in a knee immobilizer.  Mild swelling along lateral hip without obvious hematoma and minimal ecchymoses.   < 2 sec CR, 2+ pedal pulse left foot  Neuro:  Grossly intact, sensation intact and able to wiggle toes on the left foot.  Data Reviewed: Basic Metabolic Panel:  Recent Labs Lab 02/17/15 1725 02/18/15 0406  02/19/15 0353  NA 142 138 140  K 3.2* 4.0 3.7  CL 102 102 103  CO2 25 28 29   GLUCOSE 192* 141* 102*  BUN 17 15 15   CREATININE 0.96 0.90 0.94  CALCIUM 9.8 8.9 9.2   Liver Function Tests:  Recent Labs Lab 02/18/15 0406  AST 24  ALT 19  ALKPHOS 83  BILITOT 0.6  PROT 6.1*  ALBUMIN 3.1*    Recent Labs Lab 02/18/15 1105  LIPASE 32   No results for input(s): AMMONIA in the last 168 hours. CBC:  Recent Labs Lab 02/17/15 1725 02/18/15 0406 02/19/15 0353  WBC 10.5 9.4 9.1  NEUTROABS  --  7.9*  --   HGB 11.7* 10.3* 10.0*  HCT 36.3 32.5* 31.8*  MCV 99.2 98.2 99.7  PLT 248 240 238    Recent Results (from the past 240 hour(s))  Urine culture      Status: None (Preliminary result)   Collection Time: 02/17/15  6:22 PM  Result Value Ref Range Status   Specimen Description URINE, CATHETERIZED  Final   Special Requests Normal  Final   Culture >=100,000 COLONIES/mL ESCHERICHIA COLI  Final   Report Status PENDING  Incomplete  Surgical pcr screen     Status: None   Collection Time: 02/19/15  4:02 AM  Result Value Ref Range Status   MRSA, PCR NEGATIVE NEGATIVE Final   Staphylococcus aureus NEGATIVE NEGATIVE Final    Comment:        The Xpert SA Assay (FDA approved for NASAL specimens in patients over 82 years of age), is one component of a comprehensive surveillance program.  Test performance has been validated by [facility] for patients greater than or equal to 44 year old. It is not intended to diagnose infection nor to guide or monitor treatment.      Studies: Dg Chest 1 View  02/17/2015  CLINICAL DATA:  81 year old who fell and sustained a comminuted intertrochanteric left femoral neck fracture. Preoperative evaluation. EXAM: CHEST 1 VIEW COMPARISON:  None. FINDINGS: Cardiac silhouette moderately enlarged. Thoracic aorta atherosclerotic. Hilar and mediastinal contours otherwise unremarkable. Mildly prominent bronchovascular markings diffusely. Lungs otherwise clear. No localized airspace consolidation. No pleural effusions. No pneumothorax. Normal pulmonary vascularity. Prominent paracardiac fat pad on the left. IMPRESSION: Cardiomegaly.  No acute cardiopulmonary disease. Electronically Signed   By: Evangeline Dakin M.D.   On: 02/17/2015 18:16   Dg Lumbar Spine Complete  02/17/2015  CLINICAL DATA:  Fall with low back pain EXAM: LUMBAR SPINE - COMPLETE 4+ VIEW COMPARISON:  None. FINDINGS: This report assumes 5 non rib-bearing lumbar vertebrae. Mild levocurvature of the lumbar spine. Lumbar vertebral body heights are preserved, with no fracture or suspicious focal osseous lesion. Mild-to-moderate degenerative disc disease throughout  the lumbar spine, most prominent at L3-4. No spondylolisthesis. Moderate facet arthropathy bilaterally in the lower lumbar spine. Atherosclerotic calcifications throughout the abdominal aorta. IMPRESSION: 1. No lumbar spine fracture or spondylolisthesis. 2. Moderate degenerative changes as described. Electronically Signed   By: Ilona Sorrel M.D.   On: 02/17/2015 18:10   Ct Head Wo Contrast  02/17/2015  CLINICAL DATA:  Fall.  Complaining of headache and neck pain. EXAM: CT HEAD WITHOUT CONTRAST CT CERVICAL SPINE WITHOUT CONTRAST TECHNIQUE: Multidetector CT imaging of the head and cervical spine was performed following the standard protocol without intravenous contrast. Multiplanar CT image reconstructions of the cervical spine were also generated. COMPARISON:  None. FINDINGS: CT HEAD FINDINGS New ventricles are normal configuration. There is ventricular and  sulcal enlargement reflecting moderate atrophy. There are no parenchymal masses or mass effect. There is no evidence a cortical infarct. Minor periventricular white matter hypoattenuation is noted consistent chronic microvascular ischemic change. There are no extra-axial masses or abnormal fluid collections. There is no intracranial hemorrhage. Right maxillary sinus is opacified. There is associated wall thickening. This is chronic. Remaining sinuses are clear as are the mastoid air cells. No skull fracture. CT CERVICAL SPINE FINDINGS No fracture. There is slight anterolisthesis of C3 and C4, C4-C5 and C5-C6. There is moderate loss disc height at C6-C7. Small endplate spurs are noted along the mid and lower cervical spine. There is facet degenerative change bilaterally, most evident in the mid cervical spine. Bones are diffusely demineralized. The central spinal canal and neural foramina are well preserved. Soft tissue evaluation demonstrates multiple thyroid nodules, many with calcifications. A 16 mm nodules noted on the left. The other nodules are less  well-defined. There are not dense carotid vascular calcifications. Lung apices show mild scarring but are otherwise clear. IMPRESSION: HEAD CT:  No acute intracranial abnormalities. CERVICAL CT:  No fracture or acute finding. Electronically Signed   By: Lajean Manes M.D.   On: 02/17/2015 19:00   Ct Cervical Spine Wo Contrast  02/17/2015  CLINICAL DATA:  Fall.  Complaining of headache and neck pain. EXAM: CT HEAD WITHOUT CONTRAST CT CERVICAL SPINE WITHOUT CONTRAST TECHNIQUE: Multidetector CT imaging of the head and cervical spine was performed following the standard protocol without intravenous contrast. Multiplanar CT image reconstructions of the cervical spine were also generated. COMPARISON:  None. FINDINGS: CT HEAD FINDINGS New ventricles are normal configuration. There is ventricular and sulcal enlargement reflecting moderate atrophy. There are no parenchymal masses or mass effect. There is no evidence a cortical infarct. Minor periventricular white matter hypoattenuation is noted consistent chronic microvascular ischemic change. There are no extra-axial masses or abnormal fluid collections. There is no intracranial hemorrhage. Right maxillary sinus is opacified. There is associated wall thickening. This is chronic. Remaining sinuses are clear as are the mastoid air cells. No skull fracture. CT CERVICAL SPINE FINDINGS No fracture. There is slight anterolisthesis of C3 and C4, C4-C5 and C5-C6. There is moderate loss disc height at C6-C7. Small endplate spurs are noted along the mid and lower cervical spine. There is facet degenerative change bilaterally, most evident in the mid cervical spine. Bones are diffusely demineralized. The central spinal canal and neural foramina are well preserved. Soft tissue evaluation demonstrates multiple thyroid nodules, many with calcifications. A 16 mm nodules noted on the left. The other nodules are less well-defined. There are not dense carotid vascular calcifications. Lung  apices show mild scarring but are otherwise clear. IMPRESSION: HEAD CT:  No acute intracranial abnormalities. CERVICAL CT:  No fracture or acute finding. Electronically Signed   By: Lajean Manes M.D.   On: 02/17/2015 19:00   Dg Hip Operative Unilat With Pelvis Left  02/19/2015  CLINICAL DATA:  81 year old female with intra medullary nail fixation of the left hip. EXAM: OPERATIVE LEFT HIP (WITH PELVIS IF PERFORMED) 2 VIEWS TECHNIQUE: Fluoroscopic spot image(s) were submitted for interpretation post-operatively. COMPARISON:  02/17/2015. FINDINGS: Two intraoperative fluoroscopic views of the left hip are submitted for evaluation, which demonstrate interval placement of an intra medullary gamma nail providing fixation of the previously noted comminuted intertrochanteric hip fracture. Restoration of near anatomic alignment has been achieved. Femoral head remains located. There continues to be some displacement of the lesser trochanteric fracture medially, as noted on the  preoperative examination. IMPRESSION: 1. Intraoperative documentation of intra medullary nail fixation for comminuted left intertrochanteric hip fracture, with improvement in alignment, as above. Electronically Signed   By: Vinnie Langton M.D.   On: 02/19/2015 09:19   Dg Hip Unilat With Pelvis 2-3 Views Left  02/17/2015  CLINICAL DATA:  81 year old female with history of trauma from a fall complaining of left-sided hip pain. EXAM: DG HIP (WITH OR WITHOUT PELVIS) 2-3V LEFT COMPARISON:  No priors. FINDINGS: Three views of the bony pelvis and left hip demonstrate a comminuted intertrochanteric fracture of the left hip with some proximal migration of the distal fracture fragments, and approximately 30 degrees of varus angulation. There is wide distraction of multiple fracture fragments, particularly the lesser trochanteric fracture fragment which is approximately 1.5 cm medially displaced. Bony pelvis appears grossly intact, as does the visualized  portions of the right proximal femur. Left femoral head remains located. IMPRESSION: 1. Highly comminuted, displaced and angulated left intertrochanteric hip fracture, as above. Electronically Signed   By: Vinnie Langton M.D.   On: 02/17/2015 18:09    Scheduled Meds: . acetaminophen  650 mg Oral TID  . brimonidine  1 drop Both Eyes BID  . brinzolamide  1 drop Both Eyes BID  .  ceFAZolin (ANCEF) IV  2 g Intravenous Q6H  . docusate sodium  100 mg Oral BID  . doxazosin  2 mg Oral QHS  . ferrous sulfate  325 mg Oral TID PC  . levothyroxine  50 mcg Oral QAC breakfast  . metoprolol  100 mg Oral BID  . [START ON 02/20/2015] rivaroxaban  20 mg Oral Q supper  . senna  1 tablet Oral QHS  . timolol  1 drop Both Eyes BID   Continuous Infusions: . sodium chloride 50 mL/hr at 02/17/15 2306  . sodium chloride      Principal Problem:   Closed left hip fracture (HCC) Active Problems:   Atrial fibrillation ([place]), CHA2DS2-VASc Score 6   Cardiomegaly   Essential hypertension   Fall   Hypokalemia   Traumatic hematoma of left hand   Hypothyroidism   Diastolic dysfunction with chronic heart failure (HCC)   Elevated CPK   Glaucoma    Time spent: 30 min    Alexandrea Westergard, [facility] Pager [phone]. If 7PM-7AM, please contact night-coverage at [email or web address], password [facility] 02/19/2015, 2:32 PM  LOS: 2 days

## 2015-02-19 NOTE — Progress Notes (Signed)
  Echocardiogram 2D Echocardiogram has been performed.  Brianna Thompson 02/19/2015, 2:52 PM

## 2015-02-19 NOTE — Anesthesia Procedure Notes (Signed)
Procedure Name: Intubation Date/Time: 02/19/2015 7:44 AM Performed by: Jacquiline Doe A Pre-anesthesia Checklist: Patient identified, Timeout performed, Emergency Drugs available, Suction available and Patient being monitored Patient Re-evaluated:Patient Re-evaluated prior to inductionOxygen Delivery Method: Circle system utilized Preoxygenation: Pre-oxygenation with 100% oxygen Intubation Type: IV induction and Cricoid Pressure applied Ventilation: Mask ventilation without difficulty Laryngoscope Size: Miller and 2 Grade View: Grade I Tube type: Oral Tube size: 7.0 mm Number of attempts: 1 Airway Equipment and Method: Stylet Placement Confirmation: ETT inserted through vocal cords under direct vision,  breath sounds checked- equal and bilateral and positive ETCO2 Secured at: 21 cm Tube secured with: Tape Dental Injury: Teeth and Oropharynx as per pre-operative assessment

## 2015-02-19 NOTE — Anesthesia Postprocedure Evaluation (Signed)
Anesthesia Post Note  Patient: Brianna Thompson  Procedure(s) Performed: Procedure(s) (LRB): INTRAMEDULLARY (IM) NAIL INTERTROCHANTRIC (Left)  Anesthesia type: general  Patient location: PACU  Post pain: Pain level controlled  Post assessment: Patient's Cardiovascular Status Stable  Last Vitals:  Filed Vitals:   02/19/15 1003  BP: 153/91  Pulse: 92  Temp: 36.4 C  Resp: 20    Post vital signs: Reviewed and stable  Level of consciousness: sedated  Complications: No apparent anesthesia complications

## 2015-02-19 NOTE — Progress Notes (Signed)
Utilization Review Completed.Ahkeem Goede T11/09/2014  

## 2015-02-19 NOTE — Progress Notes (Signed)
Dentures returned to patient , placed upper and lower in mouth

## 2015-02-20 ENCOUNTER — Encounter (HOSPITAL_COMMUNITY): Payer: Self-pay | Admitting: Orthopaedic Surgery

## 2015-02-20 DIAGNOSIS — S72002D Fracture of unspecified part of neck of left femur, subsequent encounter for closed fracture with routine healing: Secondary | ICD-10-CM

## 2015-02-20 DIAGNOSIS — E876 Hypokalemia: Secondary | ICD-10-CM

## 2015-02-20 LAB — BASIC METABOLIC PANEL
Anion gap: 7 (ref 5–15)
BUN: 14 mg/dL (ref 6–20)
CHLORIDE: 101 mmol/L (ref 101–111)
CO2: 27 mmol/L (ref 22–32)
CREATININE: 1.03 mg/dL — AB (ref 0.44–1.00)
Calcium: 8.6 mg/dL — ABNORMAL LOW (ref 8.9–10.3)
GFR calc Af Amer: 53 mL/min — ABNORMAL LOW (ref >60)
GFR calc non Af Amer: 46 mL/min — ABNORMAL LOW (ref >60)
GLUCOSE: 101 mg/dL — AB (ref 65–99)
Potassium: 3.4 mmol/L — ABNORMAL LOW (ref 3.5–5.1)
SODIUM: 135 mmol/L (ref 135–145)

## 2015-02-20 LAB — CBC
HCT: 27.1 % — ABNORMAL LOW (ref 36.0–46.0)
Hemoglobin: 8.6 g/dL — ABNORMAL LOW (ref 12.0–15.0)
MCH: 31.5 pg (ref 26.0–34.0)
MCHC: 31.7 g/dL (ref 30.0–36.0)
MCV: 99.3 fL (ref 78.0–100.0)
PLATELETS: 200 10*3/uL (ref 150–400)
RBC: 2.73 MIL/uL — ABNORMAL LOW (ref 3.87–5.11)
RDW: 14 % (ref 11.5–15.5)
WBC: 8.8 10*3/uL (ref 4.0–10.5)

## 2015-02-20 LAB — VITAMIN D 25 HYDROXY (VIT D DEFICIENCY, FRACTURES): VIT D 25 HYDROXY: 27.7 ng/mL — AB (ref 30.0–100.0)

## 2015-02-20 LAB — URINE CULTURE: SPECIAL REQUESTS: NORMAL

## 2015-02-20 MED ORDER — METOPROLOL TARTRATE 50 MG PO TABS
50.0000 mg | ORAL_TABLET | Freq: Two times a day (BID) | ORAL | Status: DC
  Administered 2015-02-21 – 2015-02-22 (×3): 50 mg via ORAL
  Filled 2015-02-20 (×4): qty 1

## 2015-02-20 MED ORDER — POTASSIUM CHLORIDE CRYS ER 20 MEQ PO TBCR
40.0000 meq | EXTENDED_RELEASE_TABLET | Freq: Once | ORAL | Status: AC
  Administered 2015-02-20: 40 meq via ORAL
  Filled 2015-02-20: qty 2

## 2015-02-20 MED ORDER — ENSURE ENLIVE PO LIQD
237.0000 mL | Freq: Two times a day (BID) | ORAL | Status: DC
  Administered 2015-02-20 – 2015-02-22 (×4): 237 mL via ORAL

## 2015-02-20 MED ORDER — BISACODYL 10 MG RE SUPP
10.0000 mg | Freq: Once | RECTAL | Status: AC
  Administered 2015-02-20: 10 mg via RECTAL
  Filled 2015-02-20: qty 1

## 2015-02-20 MED ORDER — KCL IN DEXTROSE-NACL 20-5-0.45 MEQ/L-%-% IV SOLN
INTRAVENOUS | Status: DC
  Administered 2015-02-20: 17:00:00 via INTRAVENOUS
  Filled 2015-02-20: qty 1000

## 2015-02-20 NOTE — Care Management Important Message (Signed)
Important Message  Patient Details  Name: Brianna Thompson MRN: [id number] Date of Birth: January 22, 1922   Medicare Important Message Given:  Yes-second notification given    Loann Quill 02/20/2015, 1:43 PM

## 2015-02-20 NOTE — Progress Notes (Addendum)
[facility] PROGRESS NOTE  Brianna Thompson DOB: 09/21/1921 DOA: 02/17/2015 PCP: Leonard Downing, MD  Brief Summary  Brianna Thompson is a 81 y.o. female with history of atrial fibrillation on xarelto, chronic systolic CHF, diabetes mellitus, hypertension, hypothyroidism, dyslipidemia who presented with a mechanical fall.  She tripped on a stool and fell to the ground hitting her head and the left side of her body.  Denies LOC.  She lay for 2 hours before assistance arrived.  In the ER, she was found to have left hip fracture.  CT head and cervical spine were stable.  CXR and UA did not demonstrated infection.     Assessment/Plan  Closed left hip fracture (HCC) after mechanical fall -  Appreciate orthopedic assistance -  Resume xarelto 24 hours > will resume this evening -  SCDs pending resumption of xarelto -  Continue ultram prn  Nausea and vomiting likely secondary to pain medication and improving -  LFTs wnl -  UA neg -  Lipase wnl -  Change pain medications as above  Atrial fibrillation ([place]), CHA2DS2-VASc Score 6, rate controlled. -  Resume a/c today  Chronic diastolic CHF, rales at bases on exam -  Judicious use of IVF  -  Hold lasix  Hypertension with low normal BPs -  D/c doxazosin  -  Hold lasix -  Reduce BB and place hold parameter (already received morning dose today) -  Echo:  EF 55-60% with moderate right atrial dilation and mildly elevated RVSP 45 mmHg (pulmonary hypertension)  Hypokalemia, recurrent, resume oral repletion  Hypothyroidism, stable, continue synthroid  Minimally elevated CPK, resolved with IVF.  Glaucoma, stable, continue azopt, alphagan, timoptic  Mild postoperative acute blood anemia superimposed on iron deficiency anemia, hgb decreased slightly -  No need for blood transfusion -  Repeat CBC in AM -  Iron studies suggest iron deficiency, B12 411 wnl, folate 16.2 wnl -  TSH 1.4 wnl -  Started iron  supplementation  Constipation -  Continue colace, senna and give one dose of bisacodyl suppository  Not eating or drinking well -  Change to regular diet -  Add supplements -  Nutrition consult  Diet:  regular Access:  PIV IVF:  yes Proph:  SCDs  Code Status:  DNR  Family Communication: patient alone Disposition Plan: to SNF likely tomorrow depending on progression    Consultants:  Orthopedic surgery, Dr. Erlinda Hong  Procedures:  Intramedullary implant of the left hip on 11/6 by Dr. Frankey Shown  Antibiotics:  none   HPI/Subjective:  Has pain in the right hip.  Denies SOB, cough, chest pain. Not eating much due to nausea with meals.    Objective: Filed Vitals:   02/19/15 1502 02/19/15 2038 02/20/15 0018 02/20/15 0452  BP: 125/55 108/65 94/41 113/54  Pulse: 81 85 77 77  Temp: 97.9 F (36.6 C) 97.4 F (36.3 C) 98 F (36.7 C) 98 F (36.7 C)  TempSrc: Oral Oral Oral Oral  Resp: 20 16 16 16   SpO2: 100% 99% 98% 98%    Intake/Output Summary (Last 24 hours) at 02/20/15 0957 Last data filed at 02/20/15 0451  Gross per 24 hour  Intake    240 ml  Output    400 ml  Net   -160 ml   There were no vitals filed for this visit. There is no height or weight on file to calculate BMI.  Exam:   General:  Thin female, No acute distress  HEENT:  NCAT,  MMM  Cardiovascular:  RRR, nl S1, S2 no mrg, 2+ pulses, warm extremities  Respiratory:  Rales at the bilateral bases, no rhonchi or wheeze, no increased WOB  Abdomen:   NABS, soft, NT/ND  MSK:   Normal tone and bulk, left hip with some mild erythema/early bruising just inferior to the top dressing, incisions look well approximated without purulence, erythema or induration.   < 2 sec CR, 2+ pedal pulse left foot  Neuro:  Grossly intact, sensation intact and able to wiggle toes on the left foot.  Data Reviewed: Basic Metabolic Panel:  Recent Labs Lab 02/17/15 1725 02/18/15 0406 02/19/15 0353 02/20/15 0509  NA 142 138  140 135  K 3.2* 4.0 3.7 3.4*  CL 102 102 103 101  CO2 25 28 29 27   GLUCOSE 192* 141* 102* 101*  BUN 17 15 15 14   CREATININE 0.96 0.90 0.94 1.03*  CALCIUM 9.8 8.9 9.2 8.6*   Liver Function Tests:  Recent Labs Lab 02/18/15 0406  AST 24  ALT 19  ALKPHOS 83  BILITOT 0.6  PROT 6.1*  ALBUMIN 3.1*    Recent Labs Lab 02/18/15 1105  LIPASE 32   No results for input(s): AMMONIA in the last 168 hours. CBC:  Recent Labs Lab 02/17/15 1725 02/18/15 0406 02/19/15 0353 02/20/15 0509  WBC 10.5 9.4 9.1 8.8  NEUTROABS  --  7.9*  --   --   HGB 11.7* 10.3* 10.0* 8.6*  HCT 36.3 32.5* 31.8* 27.1*  MCV 99.2 98.2 99.7 99.3  PLT 248 240 238 200    Recent Results (from the past 240 hour(s))  Urine culture     Status: None   Collection Time: 02/17/15  6:22 PM  Result Value Ref Range Status   Specimen Description URINE, CATHETERIZED  Final   Special Requests Normal  Final   Culture >=100,000 COLONIES/mL ESCHERICHIA COLI  Final   Report Status 02/20/2015 FINAL  Final   Organism ID, Bacteria ESCHERICHIA COLI  Final      Susceptibility   Escherichia coli - MIC*    AMPICILLIN <=2 SENSITIVE Sensitive     CEFAZOLIN <=4 SENSITIVE Sensitive     CEFTRIAXONE <=1 SENSITIVE Sensitive     CIPROFLOXACIN <=0.25 SENSITIVE Sensitive     GENTAMICIN <=1 SENSITIVE Sensitive     IMIPENEM <=0.25 SENSITIVE Sensitive     NITROFURANTOIN <=16 SENSITIVE Sensitive     TRIMETH/SULFA <=20 SENSITIVE Sensitive     AMPICILLIN/SULBACTAM <=2 SENSITIVE Sensitive     PIP/TAZO <=4 SENSITIVE Sensitive     * >=100,000 COLONIES/mL ESCHERICHIA COLI  Surgical pcr screen     Status: None   Collection Time: 02/19/15  4:02 AM  Result Value Ref Range Status   MRSA, PCR NEGATIVE NEGATIVE Final   Staphylococcus aureus NEGATIVE NEGATIVE Final    Comment:        The Xpert SA Assay (FDA approved for NASAL specimens in patients over 21 years of age), is one component of a comprehensive surveillance program.  Test  performance has been validated by [facility] for patients greater than or equal to 6 year old. It is not intended to diagnose infection nor to guide or monitor treatment.      Studies: Dg Hip Operative Unilat With Pelvis Left  02/19/2015  CLINICAL DATA:  81 year old female with intra medullary nail fixation of the left hip. EXAM: OPERATIVE LEFT HIP (WITH PELVIS IF PERFORMED) 2 VIEWS TECHNIQUE: Fluoroscopic spot image(s) were submitted for interpretation post-operatively. COMPARISON:  02/17/2015. FINDINGS:  Two intraoperative fluoroscopic views of the left hip are submitted for evaluation, which demonstrate interval placement of an intra medullary gamma nail providing fixation of the previously noted comminuted intertrochanteric hip fracture. Restoration of near anatomic alignment has been achieved. Femoral head remains located. There continues to be some displacement of the lesser trochanteric fracture medially, as noted on the preoperative examination. IMPRESSION: 1. Intraoperative documentation of intra medullary nail fixation for comminuted left intertrochanteric hip fracture, with improvement in alignment, as above. Electronically Signed   By: Vinnie Langton M.D.   On: 02/19/2015 09:19    Scheduled Meds: . acetaminophen  650 mg Oral TID  . brimonidine  1 drop Both Eyes BID  . brinzolamide  1 drop Both Eyes BID  . docusate sodium  100 mg Oral BID  . doxazosin  2 mg Oral QHS  . ferrous sulfate  325 mg Oral TID PC  . levothyroxine  50 mcg Oral QAC breakfast  . metoprolol  100 mg Oral BID  . potassium chloride  40 mEq Oral Once  . rivaroxaban  20 mg Oral Q supper  . senna  1 tablet Oral QHS  . timolol  1 drop Both Eyes BID   Continuous Infusions: . sodium chloride 50 mL/hr at 02/19/15 1958    Principal Problem:   Closed left hip fracture (HCC) Active Problems:   Atrial fibrillation ([place]), CHA2DS2-VASc Score 6   Cardiomegaly   Essential hypertension   Fall   Hypokalemia    Traumatic hematoma of left hand   Hypothyroidism   Diastolic dysfunction with chronic heart failure (HCC)   Elevated CPK   Glaucoma    Time spent: 30 min    Deshannon Hinchliffe, [facility] Pager [phone]. If 7PM-7AM, please contact night-coverage at [email or web address], password [facility] 02/20/2015, 9:57 AM  LOS: 3 days

## 2015-02-20 NOTE — Progress Notes (Signed)
Pt foley catheter was removed at 0730 this morning.  Pt has attempted to void and was unsuccessful. Bladder scan at 1453 showed only 148cc. MD was paged to make aware that pt has not voided. Awaiting response back. No orders at this time

## 2015-02-20 NOTE — Evaluation (Signed)
Physical Therapy Evaluation Patient Details Name: Brianna Thompson MRN: [id number] DOB: 1921/09/30 Today's Date: 02/20/2015   History of Present Illness  81 y.o. female who presented with a mechanical fall after tripping on a stool at home.  It is reported that she did hit her head during the fall. She was diagnosed with a hip fracture and is now s/p intramedullary implant  Clinical Impression  Patient is s/p above surgery resulting in functional limitations due to the deficits listed below (see PT Problem List). Patient will benefit from skilled PT to increase their independence and safety with mobility to allow discharge to the venue listed below. Based upon the patient's current mobility level, recommending +2 assistance with bed mobility and transfers. The patient did complain of nausea during session but resolving by end of session. Will continue to progress as tolerated.      Follow Up Recommendations SNF;Supervision/Assistance - 24 hour    Equipment Recommendations  None recommended by PT;Other (comment) (to be addressed at next level of care)    Recommendations for Other Services       Precautions / Restrictions Precautions Precautions: Fall Restrictions Weight Bearing Restrictions: Yes LLE Weight Bearing: Weight bearing as tolerated      Mobility  Bed Mobility Overal bed mobility: Needs Assistance Bed Mobility: Supine to Sit;Sit to Supine;Rolling Rolling: Max assist   Supine to sit: Max assist Sit to supine: Max assist   General bed mobility comments: +2 total assist with scooting up in bed.   Transfers Overall transfer level: Needs assistance Equipment used: Rolling walker (2 wheeled) Transfers: Sit to/from Stand Sit to Stand: Max assist         General transfer comment: Able to attempt 3 steps of stand pivot before patient reports that she needed to sit. Unable to tolerate stand -pivot to chair.   Ambulation/Gait                Stairs             Wheelchair Mobility    Modified Rankin (Stroke Patients Only)       Balance Overall balance assessment: Needs assistance Sitting-balance support: Bilateral upper extremity supported Sitting balance-Leahy Scale: Poor                                       Pertinent Vitals/Pain Pain Assessment: Faces Faces Pain Scale: Hurts little more Pain Location: Lt hip Pain Descriptors / Indicators: Grimacing;Guarding Pain Intervention(s): Limited activity within patient's tolerance;Monitored during session    Home Living Family/patient expects to be discharged to:: Skilled nursing facility Living Arrangements: Alone   Type of Home: House       Home Layout: Able to live on main level with bedroom/bathroom Home Equipment: Cane - single point      Prior Function Level of Independence: Independent with assistive device(s)         Comments: reports using SPC     Hand Dominance        Extremity/Trunk Assessment   Upper Extremity Assessment: Defer to OT evaluation           Lower Extremity Assessment: Generalized weakness;LLE deficits/detail   LLE Deficits / Details: requiring max assist to move LLE in bed.      Communication   Communication: No difficulties  Cognition Arousal/Alertness: Awake/alert Behavior During Therapy: WFL for tasks assessed/performed Overall Cognitive Status: Within Functional Limits for tasks assessed  General Comments      Exercises        Assessment/Plan    PT Assessment Patient needs continued PT services  PT Diagnosis Difficulty walking;Generalized weakness;Acute pain   PT Problem List Decreased strength;Decreased range of motion;Decreased activity tolerance;Decreased balance;Decreased mobility;Pain  PT Treatment Interventions     PT Goals (Current goals can be found in the Care Plan section) Acute Rehab PT Goals Patient Stated Goal: eventually go back home PT Goal  Formulation: With patient Time For Goal Achievement: 03/06/15 Potential to Achieve Goals: Fair    Frequency Min 3X/week   Barriers to discharge Decreased caregiver support      Co-evaluation               End of Session Equipment Utilized During Treatment: Gait belt   Patient left: in bed;with call bell/phone within reach;with family/visitor present Nurse Communication: Mobility status;Weight bearing status         Time: 9480-1655 PT Time Calculation (min) (ACUTE ONLY): 35 min   Charges:   PT Evaluation $Initial PT Evaluation Tier I: 1 Procedure PT Treatments $Therapeutic Activity: 8-22 mins   PT G Codes:        Cassell Clement, PT, CSCS Pager [phone] Office [phone]  02/20/2015, 1:36 PM

## 2015-02-20 NOTE — Discharge Instructions (Signed)
1. Change dressings as needed 2. May shower but keep incisions covered and dry 3. Take xarelto to prevent blood clots 4. Take stool softeners as needed 5. Take pain meds as needed  Information on my medicine - XARELTO (Rivaroxaban)  This medication education was reviewed with me or my healthcare representative as part of my discharge preparation.  The pharmacist that spoke with me during my hospital stay was:  Duayne Cal, [facility]  Why was Xarelto prescribed for you? Xarelto was prescribed for you to reduce the risk of a blood clot forming that can cause a stroke if you have a medical condition called atrial fibrillation (a type of irregular heartbeat).  What do you need to know about xarelto ? Take your Xarelto ONCE DAILY at the same time every day with your evening meal. If you have difficulty swallowing the tablet whole, you may crush it and mix in applesauce just prior to taking your dose.  Take Xarelto exactly as prescribed by your doctor and DO NOT stop taking Xarelto without talking to the doctor who prescribed the medication.  Stopping without other stroke prevention medication to take the place of Xarelto may increase your risk of developing a clot that causes a stroke.  Refill your prescription before you run out.  After discharge, you should have regular check-up appointments with your healthcare provider that is prescribing your Xarelto.  In the future your dose may need to be changed if your kidney function or weight changes by a significant amount.  What do you do if you miss a dose? If you are taking Xarelto ONCE DAILY and you miss a dose, take it as soon as you remember on the same day then continue your regularly scheduled once daily regimen the next day. Do not take two doses of Xarelto at the same time or on the same day.   Important Safety Information A possible side effect of Xarelto is bleeding. You should call your healthcare provider right away if you  experience any of the following: ? Bleeding from an injury or your nose that does not stop. ? Unusual colored urine (red or dark brown) or unusual colored stools (red or black). ? Unusual bruising for unknown reasons. ? A serious fall or if you hit your head (even if there is no bleeding).  Some medicines may interact with Xarelto and might increase your risk of bleeding while on Xarelto. To help avoid this, consult your healthcare provider or pharmacist prior to using any new prescription or non-prescription medications, including herbals, vitamins, non-steroidal anti-inflammatory drugs (NSAIDs) and supplements.  This website has more information on Xarelto: [email or web address]

## 2015-02-20 NOTE — Progress Notes (Signed)
   Subjective:  Patient reports pain as mild.    Objective:   VITALS:   Filed Vitals:   02/19/15 1502 02/19/15 2038 02/20/15 0018 02/20/15 0452  BP: 125/55 108/65 94/41 113/54  Pulse: 81 85 77 77  Temp: 97.9 F (36.6 C) 97.4 F (36.3 C) 98 F (36.7 C) 98 F (36.7 C)  TempSrc: Oral Oral Oral Oral  Resp: 20 16 16 16   SpO2: 100% 99% 98% 98%    Neurologically intact Neurovascular intact Sensation intact distally Intact pulses distally Dorsiflexion/Plantar flexion intact Incision: dressing C/D/I and no drainage No cellulitis present Compartment soft   Lab Results  Component Value Date   WBC 8.8 02/20/2015   HGB 8.6* 02/20/2015   HCT 27.1* 02/20/2015   MCV 99.3 02/20/2015   PLT 200 02/20/2015     Assessment/Plan:  1 Day Post-Op   - Expected postop acute blood loss anemia - will monitor for symptoms - Up with PT/OT - DVT ppx - SCDs, ambulation, xarelto - WBAT operative extremity - Pain control - Discharge planning - will need SNF  Marianna Payment 02/20/2015, 7:02 AM [phone]

## 2015-02-20 NOTE — Evaluation (Signed)
Occupational Therapy Evaluation Patient Details Name: Brianna Thompson MRN: [id number] DOB: [birth year] Today's Date: 02/20/2015    History of Present Illness 81 y.o. female who presented with a mechanical fall after tripping on a stool at home.  It is reported that she did hit her head during the fall. She was diagnosed with a hip fracture and is now s/p intramedullary implant   Clinical Impression  Patient presenting with acute pain, decreased I in self care, decreased I in functional ambulation/transfers. Patient mod I PTA. Patient currently functioning at set up A - total A with pt requiring more assist with LB self care and mobility. Patient will benefit from acute OT to increase overall independence in the areas of ADLs, functional mobility, and safety in order to safely discharge to next venue of care.     Follow Up Recommendations  SNF    Equipment Recommendations  Other (comment) (defer to next venue of care)    Recommendations for Other Services       Precautions / Restrictions Precautions Precautions: Fall Restrictions Weight Bearing Restrictions: Yes LLE Weight Bearing: Weight bearing as tolerated      Mobility Bed Mobility Overal bed mobility: Needs Assistance Bed Mobility: Supine to Sit;Sit to Supine;Rolling Rolling: Max assist   Supine to sit: Max assist Sit to supine: Total assist   General bed mobility comments: total assist into bed as pt requires   Transfers Overall transfer level: Needs assistance Equipment used: Rolling walker (2 wheeled) Transfers: Sit to/from Stand Sit to Stand: Max assist         General transfer comment: sit <>stand x 2 attempts from EOB with assist for hand placement, proper technique, and safety. Pt sitting as she felt ill and unable to transfer.     Balance Overall balance assessment: Needs assistance Sitting-balance support: Feet supported;Single extremity supported Sitting balance-Leahy Scale: Poor                                       ADL Overall ADL's : Needs assistance/impaired                                       General ADL Comments: Pt seated on EOB with supervision- min guard for balance. Pt requires set up A for UB self care and total A for LB self care as pt is very fearful this session and reports, "I can't do it." Pt engaged in sit <>stand x 2 with pt requring Max A to come to full stand. Pt requiring manual faciliation for weight shifts back and forth onto L and R LEs as pt is fearful of pain and falling. Pt sat on EOB and reported feeling nauseated and  refusing to  continue further secondary to feeling unwell.      Vision     Perception     Praxis      Pertinent Vitals/Pain Pain Assessment: Faces Faces Pain Scale: Hurts even more Pain Location: L hip Pain Descriptors / Indicators: Grimacing;Guarding;Sore Pain Intervention(s): Limited activity within patient's tolerance;Monitored during session;Repositioned     Hand Dominance Right   Extremity/Trunk Assessment Upper Extremity Assessment Upper Extremity Assessment: Generalized weakness   Lower Extremity Assessment Lower Extremity Assessment: Defer to PT evaluation LLE Deficits / Details: requiring max assist to move LLE in bed.  Communication Communication Communication: No difficulties   Cognition Arousal/Alertness: Awake/alert Behavior During Therapy: WFL for tasks assessed/performed Overall Cognitive Status: Within Functional Limits for tasks assessed                     General Comments       Exercises       Shoulder Instructions      Home Living Family/patient expects to be discharged to:: Skilled nursing facility Living Arrangements: Alone   Type of Home: House       Home Layout: Able to live on main level with bedroom/bathroom               Home Equipment: Cane - single point          Prior Functioning/Environment Level of Independence:  Independent with assistive device(s)        Comments: reports using SPC,  bath in tub per pt report    OT Diagnosis: Generalized weakness;Acute pain   OT Problem List: Decreased strength;Decreased range of motion;Decreased activity tolerance;Impaired balance (sitting and/or standing);Decreased safety awareness;Pain;Decreased knowledge of use of DME or AE   OT Treatment/Interventions: Self-care/ADL training;Energy conservation;DME and/or AE instruction;Patient/family education;Therapeutic exercise;Modalities;Balance training;Therapeutic activities    OT Goals(Current goals can be found in the care plan section) Acute Rehab OT Goals Patient Stated Goal: to do for myself and go back home OT Goal Formulation: With patient Time For Goal Achievement: 03/06/15 Potential to Achieve Goals: Fair ADL Goals Pt Will Perform Lower Body Bathing: with min guard assist;with adaptive equipment;sit to/from stand Pt Will Perform Lower Body Dressing: with min guard assist;with adaptive equipment;sit to/from stand Pt Will Transfer to Toilet: with min assist;ambulating;bedside commode Pt Will Perform Toileting - Clothing Manipulation and hygiene: with min assist;sit to/from stand Pt Will Perform Tub/Shower Transfer: with min assist;rolling walker;Tub transfer;tub bench  OT Frequency: Min 2X/week   Barriers to D/C:    none known at this time       Co-evaluation              End of Session Equipment Utilized During Treatment: Rolling walker Nurse Communication: Mobility status  Activity Tolerance: Patient limited by pain Patient left: in bed;with call bell/phone within reach;with bed alarm set   Time: 2563-8937 OT Time Calculation (min): 26 min Charges:  OT General Charges $OT Visit: 1 Procedure OT Evaluation $Initial OT Evaluation Tier I: 1 Procedure OT Treatments $Self Care/Home Management : 8-22 mins G-Codes:    Phineas Semen 03/10/15, 1:55 PM

## 2015-02-21 DIAGNOSIS — E039 Hypothyroidism, unspecified: Secondary | ICD-10-CM

## 2015-02-21 LAB — BASIC METABOLIC PANEL
Anion gap: 7 (ref 5–15)
BUN: 20 mg/dL (ref 6–20)
CO2: 27 mmol/L (ref 22–32)
Calcium: 9 mg/dL (ref 8.9–10.3)
Chloride: 103 mmol/L (ref 101–111)
Creatinine, Ser: 1.07 mg/dL — ABNORMAL HIGH (ref 0.44–1.00)
GFR calc Af Amer: 51 mL/min — ABNORMAL LOW (ref >60)
GFR, EST NON AFRICAN AMERICAN: 44 mL/min — AB (ref >60)
GLUCOSE: 126 mg/dL — AB (ref 65–99)
POTASSIUM: 4.3 mmol/L (ref 3.5–5.1)
Sodium: 137 mmol/L (ref 135–145)

## 2015-02-21 LAB — CBC
HEMATOCRIT: 27.6 % — AB (ref 36.0–46.0)
HEMOGLOBIN: 8.7 g/dL — AB (ref 12.0–15.0)
MCH: 31.1 pg (ref 26.0–34.0)
MCHC: 31.5 g/dL (ref 30.0–36.0)
MCV: 98.6 fL (ref 78.0–100.0)
Platelets: 216 10*3/uL (ref 150–400)
RBC: 2.8 MIL/uL — ABNORMAL LOW (ref 3.87–5.11)
RDW: 14.2 % (ref 11.5–15.5)
WBC: 10 10*3/uL (ref 4.0–10.5)

## 2015-02-21 MED ORDER — RIVAROXABAN 15 MG PO TABS: 15.0000 mg | ORAL_TABLET | Freq: Every day | ORAL | Status: DC

## 2015-02-21 MED ORDER — METOCLOPRAMIDE HCL 5 MG PO TABS
5.0000 mg | ORAL_TABLET | Freq: Three times a day (TID) | ORAL | Status: DC
  Administered 2015-02-21 – 2015-02-22 (×4): 5 mg via ORAL
  Filled 2015-02-21 (×4): qty 1

## 2015-02-21 MED ORDER — FERROUS SULFATE 325 (65 FE) MG PO TABS: 325.0000 mg | ORAL_TABLET | Freq: Every day | ORAL | Status: DC

## 2015-02-21 MED ORDER — DOCUSATE SODIUM 100 MG PO CAPS: 100.0000 mg | ORAL_CAPSULE | Freq: Two times a day (BID) | ORAL | Status: AC

## 2015-02-21 MED ORDER — RIVAROXABAN 15 MG PO TABS
15.0000 mg | ORAL_TABLET | Freq: Every day | ORAL | Status: DC
  Filled 2015-02-21 (×2): qty 1

## 2015-02-21 MED ORDER — ENSURE ENLIVE PO LIQD: 237.0000 mL | Freq: Two times a day (BID) | ORAL | Status: DC

## 2015-02-21 MED ORDER — TRAMADOL HCL 50 MG PO TABS: 50.0000 mg | ORAL_TABLET | Freq: Four times a day (QID) | ORAL | Status: DC | PRN

## 2015-02-21 MED ORDER — PANTOPRAZOLE SODIUM 40 MG PO TBEC
40.0000 mg | DELAYED_RELEASE_TABLET | Freq: Every day | ORAL | Status: DC
  Administered 2015-02-21 – 2015-02-22 (×2): 40 mg via ORAL
  Filled 2015-02-21 (×2): qty 1

## 2015-02-21 MED ORDER — ACETAMINOPHEN 325 MG PO TABS: 650.0000 mg | ORAL_TABLET | Freq: Four times a day (QID) | ORAL | Status: AC | PRN

## 2015-02-21 NOTE — Progress Notes (Addendum)
Initial Nutrition Assessment  DOCUMENTATION CODES:   Not applicable  INTERVENTION:  Continue Ensure Enlive po BID, each supplement provides 350 kcal and 20 grams of protein.  Encourage adequate PO intake.   NUTRITION DIAGNOSIS:   Inadequate oral intake related to poor appetite, nausea as evidenced by  (meal completion 25-50%).  GOAL:   Patient will meet greater than or equal to 90% of their needs  MONITOR:   PO intake, Supplement acceptance, Weight trends, Labs, I & O's  REASON FOR ASSESSMENT:   Consult Assessment of nutrition requirement/status  ASSESSMENT:   81 y.o. female with history of atrial fibrillation on xarelto, chronic systolic CHF, diabetes mellitus, hypertension, hypothyroidism, dyslipidemia who presented with a mechanical fall. She tripped on a stool and fell to the ground hitting her head and the left side of her body. Denies LOC. She lay for 2 hours before assistance arrived. In the ER, she was found to have left hip fracture.  PROCEDURE (11/6): Treatment of intertrochanteric, pertrochanteric, subtrochanteric fracture with intramedullary implant.  Meal completion has been 25-50%. Family at bedside, she reports pt has been experiencing a decreased appetite since admission due to nausea, which family reports is a side effect from the medications. PTA pt usually eats well with at least 3 meals daily. Family reports weight has been stable. Pt currently has Ensure ordered and has been consuming them. RD to continue with current orders.   Nutrition-Focused physical exam completed. Findings are no fat depletion, moderate muscle depletion, and mild edema.   Labs and medications reviewed.   Diet Order:  Diet regular Room service appropriate?: Yes; Fluid consistency:: Thin  Skin:   (Incision on L thigh)  Last BM:  11/7  Height:   Ht Readings from Last 1 Encounters:  02/20/15 5\' 5"  (1.651 m)    Weight:   Wt Readings from Last 1 Encounters:  02/20/15 142  lb 3.2 oz (64.5 kg)    Ideal Body Weight:  56.8 kg  BMI:  Body mass index is 23.66 kg/(m^2).  Estimated Nutritional Needs:   Kcal:  1650-1850  Protein:  75-85 grams  Fluid:  1.6 - 1.8 L/day  EDUCATION NEEDS:   No education needs identified at this time  Corrin Parker, MS, RD, LDN Pager # [phone] After hours/ weekend pager # [phone]

## 2015-02-21 NOTE — Clinical Social Work Placement (Signed)
   CLINICAL SOCIAL WORK PLACEMENT  NOTE  Date:  02/21/2015  Patient Details  Name: LINELL MELDRUM MRN: [id number] Date of Birth: 07/20/1921  Clinical Social Work is seeking post-discharge placement for this patient at the [place] level of care (*CSW will initial, date and re-position this form in  chart as items are completed):  Yes   Patient/family provided with [place] Work Department's list of facilities offering this level of care within the geographic area requested by the patient (or if unable, by the patient's family).  Yes   Patient/family informed of their freedom to choose among providers that offer the needed level of care, that participate in Medicare, Medicaid or managed care program needed by the patient, have an available bed and are willing to accept the patient.  Yes   Patient/family informed of [facility]'s ownership interest in [facility] and [facility], as well as of the fact that they are under no obligation to receive care at these facilities.  PASRR submitted to EDS on 02/21/15     PASRR number received on 02/21/15     Existing PASRR number confirmed on       FL2 transmitted to all facilities in geographic area requested by pt/family on 02/21/15     FL2 transmitted to all facilities within larger geographic area on       Patient informed that his/her managed care company has contracts with or will negotiate with certain facilities, including the following:        Yes   Patient/family informed of bed offers received.  Patient chooses bed at [place], [place]     Physician recommends and patient chooses bed at      Patient to be transferred to [place], [place] on  .  Patient to be transferred to facility by       Patient family notified on   of transfer.  Name of family member notified:        PHYSICIAN Please sign FL2, Please prepare priority discharge summary, including medications      Additional Comment:    _______________________________________________ Caroline Sauger, LCSW 02/21/2015, 2:33 PM

## 2015-02-21 NOTE — Progress Notes (Signed)
Patient still been unable to void. BS showed 349ml. In and out cath done at 0050 got out 241ml of amber colored urine. IVF running in as ordered. Pt encouraged to increase po fluid intake. Will continue to monitor.

## 2015-02-21 NOTE — Progress Notes (Signed)
   Subjective:  Patient reports pain as mild.    Objective:   VITALS:   Filed Vitals:   02/20/15 0956 02/20/15 1603 02/20/15 2030 02/21/15 0420  BP: 119/52 110/54 98/45 115/56  Pulse: 77 87 87 93  Temp:  98.6 F (37 C) 98 F (36.7 C) 97.5 F (36.4 C)  TempSrc:  Oral Oral Oral  Resp:  16 16 18   Height: 5\' 5"  (1.651 m)     Weight: 64.5 kg (142 lb 3.2 oz)     SpO2:  92% 91% 94%    Neurologically intact Neurovascular intact Sensation intact distally Intact pulses distally Dorsiflexion/Plantar flexion intact Incision: dressing C/D/I and no drainage No cellulitis present Compartment soft   Lab Results  Component Value Date   WBC 10.0 02/21/2015   HGB 8.7* 02/21/2015   HCT 27.6* 02/21/2015   MCV 98.6 02/21/2015   PLT 216 02/21/2015     Assessment/Plan:  2 Days Post-Op   - Expected postop acute blood loss anemia - will monitor for symptoms - Up with PT/OT - DVT ppx - SCDs, ambulation, xarelto - WBAT operative extremity - Pain control - Discharge planning - SNF pending  Marianna Payment 02/21/2015, 7:46 AM [phone]

## 2015-02-21 NOTE — Progress Notes (Signed)
Bladder scan done this morning showed ~183ml. Patient denies any urge to void. Will continue to monitor.

## 2015-02-21 NOTE — NC FL2 (Signed)
[place] MEDICAID FL2 LEVEL OF CARE SCREENING TOOL     IDENTIFICATION  Patient Name: Brianna Thompson Birthdate: 26-Sep-1921 Sex: female Admission Date (Current Location): 02/17/2015  [facility] and [place] Number:     Facility and Address:  The . [facility] [place] [address], [place], [place] [address]      Provider Number: [id number]  Attending Physician Name and Address:  Janece Canterbury, MD  Relative Name and Phone Number:  Quida Glasser, [phone]    Current Level of Care: Hospital Recommended Level of Care: [place] Prior Approval Number:    Date Approved/Denied:   PASRR Number: [id number]  Discharge Plan: SNF    Current Diagnoses: Patient Active Problem List   Diagnosis Date Noted  . Hip fracture ([place]) 02/17/2015  . Closed left hip fracture ([place]) 02/17/2015  . Atrial fibrillation ([place]), CHA2DS2-VASc Score 6 02/17/2015  . Cardiomegaly 02/17/2015  . Essential hypertension 02/17/2015  . Fall 02/17/2015  . Hypokalemia 02/17/2015  . Traumatic hematoma of left hand 02/17/2015  . Hypothyroidism 02/17/2015  . Diastolic dysfunction with chronic heart failure ([place]) 02/17/2015  . Elevated CPK 02/17/2015  . Glaucoma 02/17/2015    Orientation ACTIVITIES/SOCIAL BLADDER RESPIRATION    Self, Time, Situation, Place  Family supportive Continent Normal  BEHAVIORAL SYMPTOMS/MOOD NEUROLOGICAL BOWEL NUTRITION STATUS  Other (Comment) (n/a)  (n/a) Continent  (Please see discharge summary.)  PHYSICIAN VISITS COMMUNICATION OF NEEDS Height & Weight Skin    Verbally 5\' 5"  (165.1 cm) 142 lbs. Surgical wounds          AMBULATORY STATUS RESPIRATION    Assist extensive Normal      Personal Care Assistance Level of Assistance  Bathing, Dressing Bathing Assistance: Maximum assistance   Dressing Assistance: Maximum assistance      Functional Limitations Info                SPECIAL CARE FACTORS FREQUENCY  PT (By licensed PT), OT (By  licensed OT)     PT Frequency: 5 OT Frequency: 5           Additional Factors Info  Allergies Code Status Info: FULL Allergies Info: No known allergies.           Current Medications (02/21/2015): Current Facility-Administered Medications  Medication Dose Route Frequency Provider Last Rate Last Dose  . acetaminophen (TYLENOL) tablet 650 mg  650 mg Oral TID Janece Canterbury, MD   650 mg at 02/21/15 0926  . alum & mag hydroxide-simeth (MAALOX/MYLANTA) 200-200-20 MG/5ML suspension 30 mL  30 mL Oral Q4H PRN Naiping Ephriam Jenkins, MD      . brimonidine (ALPHAGAN) 0.2 % ophthalmic solution 1 drop  1 drop Both Eyes BID Lavina Hamman, MD   1 drop at 02/21/15 0926  . brinzolamide (AZOPT) 1 % ophthalmic suspension 1 drop  1 drop Both Eyes BID Lavina Hamman, MD   1 drop at 02/21/15 0926  . dextrose 5 % and 0.45 % NaCl with KCl 20 mEq/L infusion   Intravenous Continuous Janece Canterbury, MD 100 mL/hr at 02/21/15 0927    . docusate sodium (COLACE) capsule 100 mg  100 mg Oral BID Janece Canterbury, MD   100 mg at 02/21/15 0926  . feeding supplement (ENSURE ENLIVE) (ENSURE ENLIVE) liquid 237 mL  237 mL Oral BID BM Janece Canterbury, MD   237 mL at 02/21/15 1240  . ferrous sulfate tablet 325 mg  325 mg Oral TID PC Lavina Hamman, MD   325 mg  at 02/21/15 1240  . levothyroxine (SYNTHROID, LEVOTHROID) tablet 50 mcg  50 mcg Oral QAC breakfast Lavina Hamman, MD   50 mcg at 02/21/15 0557  . magnesium hydroxide (MILK OF MAGNESIA) suspension 30 mL  30 mL Oral Daily PRN Janece Canterbury, MD   30 mL at 02/21/15 1152  . menthol-cetylpyridinium (CEPACOL) lozenge 3 mg  1 lozenge Oral PRN Naiping Ephriam Jenkins, MD       Or  . phenol (CHLORASEPTIC) mouth spray 1 spray  1 spray Mouth/Throat PRN Naiping Ephriam Jenkins, MD      . methocarbamol (ROBAXIN) tablet 500 mg  500 mg Oral Q6H PRN Leandrew Koyanagi, MD   500 mg at 02/19/15 1659   Or  . methocarbamol (ROBAXIN) 500 mg in dextrose 5 % 50 mL IVPB  500 mg Intravenous Q6H PRN Naiping Ephriam Jenkins, MD      .  metoCLOPramide (REGLAN) tablet 5 mg  5 mg Oral TID AC Janece Canterbury, MD   5 mg at 02/21/15 1237  . metoprolol (LOPRESSOR) tablet 50 mg  50 mg Oral BID Janece Canterbury, MD   50 mg at 02/21/15 0926  . morphine 2 MG/ML injection 0.5 mg  0.5 mg Intravenous Q2H PRN Janece Canterbury, MD      . ondansetron [facility]) tablet 4 mg  4 mg Oral Q6H PRN Naiping Ephriam Jenkins, MD       Or  . ondansetron [facility]) injection 4 mg  4 mg Intravenous Q6H PRN Leandrew Koyanagi, MD   4 mg at 02/20/15 0818  . pantoprazole (PROTONIX) EC tablet 40 mg  40 mg Oral Daily Janece Canterbury, MD   40 mg at 02/21/15 1240  . Rivaroxaban (XARELTO) tablet 15 mg  15 mg Oral Q supper Janece Canterbury, MD      . senna [facility]) tablet 8.6 mg  1 tablet Oral QHS Janece Canterbury, MD   8.6 mg at 02/20/15 2155  . timolol (TIMOPTIC) 0.5 % ophthalmic solution 1 drop  1 drop Both Eyes BID Lavina Hamman, MD   1 drop at 02/21/15 0926  . traMADol (ULTRAM) tablet 100 mg  100 mg Oral Q6H PRN Janece Canterbury, MD   100 mg at 02/19/15 1157   Do not use this list as official medication orders. Please verify with discharge summary.  Discharge Medications:   Medication List    TAKE these medications        oxyCODONE-acetaminophen 5-325 MG tablet  Commonly known as:  PERCOCET  Take 1-2 tablets by mouth every 4 (four) hours as needed for severe pain.     rivaroxaban 20 MG Tabs tablet  Commonly known as:  XARELTO  Take 20 mg by mouth daily with supper.     rivaroxaban 10 MG Tabs tablet  Commonly known as:  XARELTO  Take 2 tablets (20 mg total) by mouth daily.      ASK your doctor about these medications        doxazosin 2 MG tablet  Commonly known as:  CARDURA  Take 2 mg by mouth at bedtime.     furosemide 40 MG tablet  Commonly known as:  LASIX  Take 20 mg by mouth daily.     ibuprofen 200 MG tablet  Commonly known as:  ADVIL,MOTRIN  Take 400 mg by mouth every 6 (six) hours as needed for moderate pain.     levothyroxine 50 MCG tablet  Commonly  known as:  SYNTHROID, LEVOTHROID  Take 50 mcg by mouth  daily before breakfast.     lisinopril 20 MG tablet  Commonly known as:  PRINIVIL,ZESTRIL  Take 20 mg by mouth every 12 (twelve) hours.     lovastatin 20 MG tablet  Commonly known as:  MEVACOR  Take 20 mg by mouth daily at 6 PM.     metoprolol 100 MG tablet  Commonly known as:  LOPRESSOR  Take 100 mg by mouth 2 (two) times daily.     PRESERVISION AREDS 2 Caps  Take 1 capsule by mouth 2 (two) times daily.     SIMBRINZA 1-0.2 % Susp  Generic drug:  Brinzolamide-Brimonidine  Apply 1 drop to eye 2 (two) times daily.     timolol 0.5 % ophthalmic solution  Commonly known as:  BETIMOL  Place 1 drop into both eyes 2 (two) times daily.     traMADol 50 MG tablet  Commonly known as:  ULTRAM  Take 12.5 mg by mouth 3 (three) times daily.        Relevant Imaging Results:  Relevant Lab Results:  Recent Labs    Additional Information SS#: [id number]  Luna Kitchens [phone]

## 2015-02-21 NOTE — Progress Notes (Signed)
[facility] PROGRESS NOTE  Brianna Thompson GUY:[id number] DOB: [birth year] DOA: 02/17/2015 PCP: Leonard Downing, MD  Brief Summary  Brianna Thompson is a 81 y.o. female with history of atrial fibrillation on xarelto, chronic systolic CHF, diabetes mellitus, hypertension, hypothyroidism, dyslipidemia who presented with a mechanical fall.  She tripped on a stool and fell to the ground hitting her head and the left side of her body.  Denies LOC.  She lay for 2 hours before assistance arrived.  In the ER, she was found to have left hip fracture.  CT head and cervical spine were stable.  CXR and UA did not demonstrated infection.     Assessment/Plan  Closed left hip fracture (HCC) after mechanical fall -  Appreciate orthopedic assistance -  Xarelto for DVT proph -  Continue ultram prn  Nausea and vomiting likely secondary to pain medication, persisting -  LFTs wnl -  UA neg -  Lipase wnl -  Start scheduled reglan prior to meals -  Start protonix -  Continue zofran prn  Atrial fibrillation ([place]), CHA2DS2-VASc Score 6, rate controlled. -  Continue A/C  Chronic diastolic CHF, appears dry today, not eating or drinking much -  Resume IVF  -  Hold lasix  Hypertension with low normal BPs -  D/c doxazosin  -  Hold lasix -  Continue reduced dose BB and place hold parameter (already received morning dose today) -  Echo:  EF 55-60% with moderate right atrial dilation and mildly elevated RVSP 45 mmHg (pulmonary hypertension)  Hypokalemia, recurrent, resume oral repletion  Hypothyroidism, stable, continue synthroid  Minimally elevated CPK, resolved with IVF.  Glaucoma, stable, continue azopt, alphagan, timoptic  Mild postoperative acute blood anemia superimposed on iron deficiency anemia, hgb stable today -  Iron studies suggest iron deficiency, B12 411 wnl, folate 16.2 wnl -  TSH 1.4 wnl -  Started iron supplementation  Constipation -  Continue colace, senna and give one  dose of bisacodyl suppository  Not eating or drinking well and minimal uop over last 24 h with rising BUN/creatinine -  Continue regular diet with supplements -  Add supplements -  Nutrition consult  Diet:  regular Access:  PIV IVF:  yes Proph:  SCDs  Code Status:  DNR  Family Communication: patient alone Disposition Plan: to SNF likely tomorrow if able to eat/drink better   Consultants:  Orthopedic surgery, Dr. Erlinda Hong  Procedures:  Intramedullary implant of the left hip on 11/6 by Dr. Frankey Shown  Antibiotics:  none   HPI/Subjective:  Not eating or drinking much due to nausea with meals.    Objective: Filed Vitals:   02/20/15 0956 02/20/15 1603 02/20/15 2030 02/21/15 0420  BP: 119/52 110/54 98/45 115/56  Pulse: 77 87 87 93  Temp:  98.6 F (37 C) 98 F (36.7 C) 97.5 F (36.4 C)  TempSrc:  Oral Oral Oral  Resp:  16 16 18   Height: 5\' 5"  (1.651 m)     Weight: 64.5 kg (142 lb 3.2 oz)     SpO2:  92% 91% 94%    Intake/Output Summary (Last 24 hours) at 02/21/15 1039 Last data filed at 02/21/15 0050  Gross per 24 hour  Intake      0 ml  Output    275 ml  Net   -275 ml   Filed Weights   02/20/15 0956  Weight: 64.5 kg (142 lb 3.2 oz)   Body mass index is 23.66 kg/(m^2).  Exam:   General:  Thin female, No acute distress  HEENT:  NCAT, MMM  Cardiovascular:  RRR, nl S1, S2 no mrg, 2+ pulses, warm extremities  Respiratory:  Rales that clear somewhat at bilateral bases, no rhonchi or wheeze, no increased WOB  Abdomen:   NABS, soft, NT/ND  MSK:   Normal tone and bulk, left hip with some mild erythema/early bruising just inferior to the top dressing, incisions look well approximated without purulence, erythema or induration.   < 2 sec CR, 2+ pedal pulse left foot  Neuro:  Grossly intact, sensation intact and able to wiggle toes on the left foot.  Data Reviewed: Basic Metabolic Panel:  Recent Labs Lab 02/17/15 1725 02/18/15 0406 02/19/15 0353  02/20/15 0509 02/21/15 0344  NA 142 138 140 135 137  K 3.2* 4.0 3.7 3.4* 4.3  CL 102 102 103 101 103  CO2 25 28 29 27 27   GLUCOSE 192* 141* 102* 101* 126*  BUN 17 15 15 14 20   CREATININE 0.96 0.90 0.94 1.03* 1.07*  CALCIUM 9.8 8.9 9.2 8.6* 9.0   Liver Function Tests:  Recent Labs Lab 02/18/15 0406  AST 24  ALT 19  ALKPHOS 83  BILITOT 0.6  PROT 6.1*  ALBUMIN 3.1*    Recent Labs Lab 02/18/15 1105  LIPASE 32   No results for input(s): AMMONIA in the last 168 hours. CBC:  Recent Labs Lab 02/17/15 1725 02/18/15 0406 02/19/15 0353 02/20/15 0509 02/21/15 0344  WBC 10.5 9.4 9.1 8.8 10.0  NEUTROABS  --  7.9*  --   --   --   HGB 11.7* 10.3* 10.0* 8.6* 8.7*  HCT 36.3 32.5* 31.8* 27.1* 27.6*  MCV 99.2 98.2 99.7 99.3 98.6  PLT 248 240 238 200 216    Recent Results (from the past 240 hour(s))  Urine culture     Status: None   Collection Time: 02/17/15  6:22 PM  Result Value Ref Range Status   Specimen Description URINE, CATHETERIZED  Final   Special Requests Normal  Final   Culture >=100,000 COLONIES/mL ESCHERICHIA COLI  Final   Report Status 02/20/2015 FINAL  Final   Organism ID, Bacteria ESCHERICHIA COLI  Final      Susceptibility   Escherichia coli - MIC*    AMPICILLIN <=2 SENSITIVE Sensitive     CEFAZOLIN <=4 SENSITIVE Sensitive     CEFTRIAXONE <=1 SENSITIVE Sensitive     CIPROFLOXACIN <=0.25 SENSITIVE Sensitive     GENTAMICIN <=1 SENSITIVE Sensitive     IMIPENEM <=0.25 SENSITIVE Sensitive     NITROFURANTOIN <=16 SENSITIVE Sensitive     TRIMETH/SULFA <=20 SENSITIVE Sensitive     AMPICILLIN/SULBACTAM <=2 SENSITIVE Sensitive     PIP/TAZO <=4 SENSITIVE Sensitive     * >=100,000 COLONIES/mL ESCHERICHIA COLI  Surgical pcr screen     Status: None   Collection Time: 02/19/15  4:02 AM  Result Value Ref Range Status   MRSA, PCR NEGATIVE NEGATIVE Final   Staphylococcus aureus NEGATIVE NEGATIVE Final    Comment:        The Xpert SA Assay (FDA approved for NASAL  specimens in patients over 67 years of age), is one component of a comprehensive surveillance program.  Test performance has been validated by [facility] for patients greater than or equal to 20 year old. It is not intended to diagnose infection nor to guide or monitor treatment.      Studies: No results found.  Scheduled Meds: . acetaminophen  650 mg Oral TID  . brimonidine  1 drop Both Eyes BID  . brinzolamide  1 drop Both Eyes BID  . docusate sodium  100 mg Oral BID  . feeding supplement (ENSURE ENLIVE)  237 mL Oral BID BM  . ferrous sulfate  325 mg Oral TID PC  . levothyroxine  50 mcg Oral QAC breakfast  . metoprolol  50 mg Oral BID  . rivaroxaban  20 mg Oral Q supper  . senna  1 tablet Oral QHS  . timolol  1 drop Both Eyes BID   Continuous Infusions: . dextrose 5 % and 0.45 % NaCl with KCl 20 mEq/L 100 mL/hr at 02/21/15 0086    Principal Problem:   Closed left hip fracture (HCC) Active Problems:   Atrial fibrillation ([place]), CHA2DS2-VASc Score 6   Cardiomegaly   Essential hypertension   Fall   Hypokalemia   Traumatic hematoma of left hand   Hypothyroidism   Diastolic dysfunction with chronic heart failure (HCC)   Elevated CPK   Glaucoma    Time spent: 30 min    Yvette Loveless, [place] [facility] Pager [phone]. If 7PM-7AM, please contact night-coverage at [email or web address], password [facility] 02/21/2015, 10:39 AM  LOS: 4 days

## 2015-02-21 NOTE — Clinical Social Work Note (Signed)
Clinical Social Work Assessment  Patient Details  Name: Brianna Thompson MRN: [id number] Date of Birth: July 24, 1921  Date of referral:  02/21/15               Reason for consult:  Facility Placement, Discharge Planning                Permission sought to share information with:  Facility Sport and exercise psychologist, Family Supports Permission granted to share information::  Yes, Verbal Permission Granted  Name::     Marg Macmaster  Agency::  [facility] SNF  Relationship::  Daughter-in-law  Contact Information:  [phone]  Housing/Transportation Living arrangements for the past 2 months:  [place] of Information:  Patient, Adult Children (Daughter-in-law provided more information via phone.) Patient Interpreter Needed:  None Criminal Activity/Legal Involvement Pertinent to Current Situation/Hospitalization:  No - Comment as needed Significant Relationships:  Adult Children (Daughter-in-law) Lives with:  Self Do you feel safe going back to the place where you live?  No (High fall risk.) Need for family participation in patient care:  Yes (Comment) (Patient's daughter-in-law active in patient's care.)  Care giving concerns:  Patient expressed no concerns at this time.   Social Worker assessment / plan:  CSW received referral for possible SNF placement at time of discharge. CSW met with patient at bedside to discuss discharge planning. Per patient, patient lives alone but has a daughter-in-law who lives next door to patient. Patient reports both patient's husband and only child (son) have passed away and patient's daughter-in-law is the only family patient has. Patient expressed understanding of PT recommendation for SNF placement, but stated patient did not have a preference at this time. Patient requested CSW speak with patient's daughter-in-law regarding discharge planning.  CSW spoke with patient's daughter-in-law regarding discharge. Per patient's daughter-in-law,  patient's family would prefer for patient to discharge to [place]. CSW to continue to follow and assist with discharge planning needs.  Employment status:  Retired Forensic scientist:  [employer]) PT Recommendations:  [place] / Referral to community resources:  [place]  Patient/Family's Response to care:  Patient and patient's family understanding of CSW plan of care.  Patient/Family's Understanding of and Emotional Response to Diagnosis, Current Treatment, and Prognosis:  Patient and patient's family understanding of CSW plan of care.  Emotional Assessment Appearance:  Appears stated age Attitude/Demeanor/Rapport:  Other (Pleasant.) Affect (typically observed):  Accepting, Appropriate, Pleasant, Quiet Orientation:  Oriented to Self, Oriented to Place, Oriented to  Time, Oriented to Situation Alcohol / Substance use:  Not Applicable Psych involvement (Current and /or in the community):  No (Comment) (Not appropriate on this admission.)  Discharge Needs  Concerns to be addressed:  No discharge needs identified Readmission within the last 30 days:  No Current discharge risk:  None Barriers to Discharge:  No Barriers Identified   Caroline Sauger, LCSW 02/21/2015, 2:26 PM [phone]

## 2015-02-21 NOTE — Discharge Summary (Signed)
Physician Discharge Summary  Brianna Thompson:[id number] DOB: 01-05-1922 DOA: 02/17/2015  PCP: Leonard Downing, MD  Admit date: 02/17/2015 Discharge date:  Anticipate d/c on 11/9  Recommendations for Outpatient Follow-up:  1. To SNF for PT/OT 2. PCP appointment in 1 week.  Resume ACEI, lasix, doxazosin at discretion of PCP. 3. Repeat CBC and BMP at next appointment  Discharge Diagnoses:  Principal Problem:   Closed left hip fracture [facility]) Active Problems:   Atrial fibrillation ([place]), CHA2DS2-VASc Score 6   Cardiomegaly   Essential hypertension   Fall   Hypokalemia   Traumatic hematoma of left hand   Hypothyroidism   Diastolic dysfunction with chronic heart failure (HCC)   Elevated CPK   Glaucoma   Discharge Condition: stable, improved  Diet recommendation: regular   Wt Readings from Last 3 Encounters:  02/20/15 64.5 kg (142 lb 3.2 oz)    History of present illness:   Brianna Thompson is a 81 y.o. female with history of atrial fibrillation on xarelto, chronic systolic CHF, diabetes mellitus, hypertension, hypothyroidism, dyslipidemia who presented with a mechanical fall. She tripped on a stool and fell to the ground hitting her head and the left side of her body. Denies LOC. She lay for 2 hours before assistance arrived. In the ER, she was found to have left hip fracture. CT head and cervical spine were stable. CXR and UA did not demonstrated infection.   Hospital Course:   Closed left hip fracture (HCC) after mechanical fall s/p Intramedullary implant of the left hip on 11/6 by Dr. Frankey Shown.  She is on xarelto for DVT prophylaxis and pain is being managed with ultram and tylenol.  PT/OT recommending SNF for ongoing therapy.    Nausea and vomiting likely secondary to pain medication, resolving.  LFTs, lipase, and UA wnl.  Continue prn antiemetics.    Atrial fibrillation ([place]), CHA2DS2-VASc Score 6, rate controlled.  She resumed xarelto post-operatively,  dose adjusted for age, rate controlled on beta blocker.    Chronic diastolic CHF.  Due to poor PO intake, her lasix has been held.  F/u in 1 week for reevaluation to determine if her lasix may be resumed.    Hypertension with low normal to mildly hypotensive BPs.  Held doxazosin and lasix. Echo: EF 55-60% with moderate right atrial dilation and mildly elevated RVSP 45 mmHg (pulmonary hypertension)  Hypokalemia, recurrent, resumed oral repletion  Hypothyroidism, stable, continued synthroid  Minimally elevated CPK, resolved with IVF.  Glaucoma, stable, continued azopt, alphagan, timoptic  Mild postoperative acute blood anemia superimposed on iron deficiency anemia.  Iron studies suggest iron deficiency, B12 411 wnl, folate 16.2 wnl, TSH 1.4 wnl.  She was started on iron supplementation.    Constipation, resolved with colace, senna and give one dose of bisacodyl suppository  Not eating or drinking well postoperatively.  Given regular diet with supplements.  Readdress healthy heart diet once she is eating and drinking more regularly.     Consultants:  Orthopedic surgery, Dr. Erlinda Hong  Procedures:  Intramedullary implant of the left hip on 11/6 by Dr. Frankey Shown  Antibiotics:  none Discharge Exam: Filed Vitals:   02/21/15 1346  BP: 121/62  Pulse: 85  Temp: 97.6 F (36.4 C)  Resp: 18   Filed Vitals:   02/20/15 1603 02/20/15 2030 02/21/15 0420 02/21/15 1346  BP: 110/54 98/45 115/56 121/62  Pulse: 87 87 93 85  Temp: 98.6 F (37 C) 98 F (36.7 C) 97.5 F (36.4 C) 97.6 F (36.4  C)  TempSrc: Oral Oral Oral Oral  Resp: 16 16 18 18   Height:      Weight:      SpO2: 92% 91% 94% 95%     General: Thin female, No acute distress  HEENT: NCAT, MMM  Cardiovascular: RRR, nl S1, S2 no mrg, 2+ pulses, warm extremities  Respiratory: Rales that clear somewhat at bilateral bases, no rhonchi or wheeze, no increased WOB  Abdomen: NABS, soft, NT/ND  MSK: Normal tone and bulk,  left hip with some mild erythema/early bruising just inferior to the top dressing, incisions look well approximated without purulence, erythema or induration. < 2 sec CR, 2+ pedal pulse left foot  Neuro: Grossly intact, sensation intact and able to wiggle toes on the left foot.   Discharge Instructions      Discharge Instructions    (HEART FAILURE PATIENTS) Call MD:  Anytime you have any of the following symptoms: 1) 3 pound weight gain in 24 hours or 5 pounds in 1 week 2) shortness of breath, with or without a dry hacking cough 3) swelling in the hands, feet or stomach 4) if you have to sleep on extra pillows at night in order to breathe.    Complete by:  As directed      Call MD for:  difficulty breathing, headache or visual disturbances    Complete by:  As directed      Call MD for:  extreme fatigue    Complete by:  As directed      Call MD for:  hives    Complete by:  As directed      Call MD for:  persistant dizziness or light-headedness    Complete by:  As directed      Call MD for:  persistant nausea and vomiting    Complete by:  As directed      Call MD for:  redness, tenderness, or signs of infection (pain, swelling, redness, odor or green/yellow discharge around incision site)    Complete by:  As directed      Call MD for:  severe uncontrolled pain    Complete by:  As directed      Call MD for:  temperature >100.4    Complete by:  As directed      Diet general    Complete by:  As directed      Increase activity slowly    Complete by:  As directed      Weight bearing as tolerated    Complete by:  As directed             Medication List    STOP taking these medications        doxazosin 2 MG tablet  Commonly known as:  CARDURA     furosemide 40 MG tablet  Commonly known as:  LASIX     lisinopril 20 MG tablet  Commonly known as:  PRINIVIL,ZESTRIL      TAKE these medications        acetaminophen 325 MG tablet  Commonly known as:  TYLENOL  Take 2 tablets  (650 mg total) by mouth every 6 (six) hours as needed for mild pain, moderate pain, fever or headache.     docusate sodium 100 MG capsule  Commonly known as:  COLACE  Take 1 capsule (100 mg total) by mouth 2 (two) times daily.     feeding supplement (ENSURE ENLIVE) Liqd  Take 237 mLs by mouth 2 (two) times daily between  meals.     ferrous sulfate 325 (65 FE) MG tablet  Take 1 tablet (325 mg total) by mouth daily with breakfast.     ibuprofen 200 MG tablet  Commonly known as:  ADVIL,MOTRIN  Take 400 mg by mouth every 6 (six) hours as needed for moderate pain.     levothyroxine 50 MCG tablet  Commonly known as:  SYNTHROID, LEVOTHROID  Take 50 mcg by mouth daily before breakfast.     lovastatin 20 MG tablet  Commonly known as:  MEVACOR  Take 20 mg by mouth daily at 6 PM.     metoprolol 100 MG tablet  Commonly known as:  LOPRESSOR  Take 100 mg by mouth 2 (two) times daily.     PRESERVISION AREDS 2 Caps  Take 1 capsule by mouth 2 (two) times daily.     Rivaroxaban 15 MG Tabs tablet  Commonly known as:  XARELTO  Take 1 tablet (15 mg total) by mouth daily with supper.     SIMBRINZA 1-0.2 % Susp  Generic drug:  Brinzolamide-Brimonidine  Apply 1 drop to eye 2 (two) times daily.     timolol 0.5 % ophthalmic solution  Commonly known as:  BETIMOL  Place 1 drop into both eyes 2 (two) times daily.     traMADol 50 MG tablet  Commonly known as:  ULTRAM  Take 1 tablet (50 mg total) by mouth every 6 (six) hours as needed for moderate pain or severe pain.       Follow-up Information    Follow up with Marianna Payment, MD In 2 weeks.   Specialty:  Orthopedic Surgery   Why:  For suture removal, For wound re-check   Contact information:   [place] [id number] [phone]       Follow up with Leonard Downing, MD. Schedule an appointment as soon as possible for a visit in 1 week.   Specialty:  Family Medicine   Contact information:   [place]  [address] [phone]        The results of significant diagnostics from this hospitalization (including imaging, microbiology, ancillary and laboratory) are listed below for reference.    Significant Diagnostic Studies: Dg Chest 1 View  02/17/2015  CLINICAL DATA:  81 year old who fell and sustained a comminuted intertrochanteric left femoral neck fracture. Preoperative evaluation. EXAM: CHEST 1 VIEW COMPARISON:  None. FINDINGS: Cardiac silhouette moderately enlarged. Thoracic aorta atherosclerotic. Hilar and mediastinal contours otherwise unremarkable. Mildly prominent bronchovascular markings diffusely. Lungs otherwise clear. No localized airspace consolidation. No pleural effusions. No pneumothorax. Normal pulmonary vascularity. Prominent paracardiac fat pad on the left. IMPRESSION: Cardiomegaly.  No acute cardiopulmonary disease. Electronically Signed   By: Evangeline Dakin M.D.   On: 02/17/2015 18:16   Dg Lumbar Spine Complete  02/17/2015  CLINICAL DATA:  Fall with low back pain EXAM: LUMBAR SPINE - COMPLETE 4+ VIEW COMPARISON:  None. FINDINGS: This report assumes 5 non rib-bearing lumbar vertebrae. Mild levocurvature of the lumbar spine. Lumbar vertebral body heights are preserved, with no fracture or suspicious focal osseous lesion. Mild-to-moderate degenerative disc disease throughout the lumbar spine, most prominent at L3-4. No spondylolisthesis. Moderate facet arthropathy bilaterally in the lower lumbar spine. Atherosclerotic calcifications throughout the abdominal aorta. IMPRESSION: 1. No lumbar spine fracture or spondylolisthesis. 2. Moderate degenerative changes as described. Electronically Signed   By: Ilona Sorrel M.D.   On: 02/17/2015 18:10   Ct Head Wo Contrast  02/17/2015  CLINICAL DATA:  Fall.  Complaining of headache and neck pain. EXAM: CT HEAD WITHOUT CONTRAST CT CERVICAL SPINE WITHOUT CONTRAST TECHNIQUE: Multidetector CT imaging of the head and cervical  spine was performed following the standard protocol without intravenous contrast. Multiplanar CT image reconstructions of the cervical spine were also generated. COMPARISON:  None. FINDINGS: CT HEAD FINDINGS New ventricles are normal configuration. There is ventricular and sulcal enlargement reflecting moderate atrophy. There are no parenchymal masses or mass effect. There is no evidence a cortical infarct. Minor periventricular white matter hypoattenuation is noted consistent chronic microvascular ischemic change. There are no extra-axial masses or abnormal fluid collections. There is no intracranial hemorrhage. Right maxillary sinus is opacified. There is associated wall thickening. This is chronic. Remaining sinuses are clear as are the mastoid air cells. No skull fracture. CT CERVICAL SPINE FINDINGS No fracture. There is slight anterolisthesis of C3 and C4, C4-C5 and C5-C6. There is moderate loss disc height at C6-C7. Small endplate spurs are noted along the mid and lower cervical spine. There is facet degenerative change bilaterally, most evident in the mid cervical spine. Bones are diffusely demineralized. The central spinal canal and neural foramina are well preserved. Soft tissue evaluation demonstrates multiple thyroid nodules, many with calcifications. A 16 mm nodules noted on the left. The other nodules are less well-defined. There are not dense carotid vascular calcifications. Lung apices show mild scarring but are otherwise clear. IMPRESSION: HEAD CT:  No acute intracranial abnormalities. CERVICAL CT:  No fracture or acute finding. Electronically Signed   By: Lajean Manes M.D.   On: 02/17/2015 19:00   Ct Cervical Spine Wo Contrast  02/17/2015  CLINICAL DATA:  Fall.  Complaining of headache and neck pain. EXAM: CT HEAD WITHOUT CONTRAST CT CERVICAL SPINE WITHOUT CONTRAST TECHNIQUE: Multidetector CT imaging of the head and cervical spine was performed following the standard protocol without intravenous  contrast. Multiplanar CT image reconstructions of the cervical spine were also generated. COMPARISON:  None. FINDINGS: CT HEAD FINDINGS New ventricles are normal configuration. There is ventricular and sulcal enlargement reflecting moderate atrophy. There are no parenchymal masses or mass effect. There is no evidence a cortical infarct. Minor periventricular white matter hypoattenuation is noted consistent chronic microvascular ischemic change. There are no extra-axial masses or abnormal fluid collections. There is no intracranial hemorrhage. Right maxillary sinus is opacified. There is associated wall thickening. This is chronic. Remaining sinuses are clear as are the mastoid air cells. No skull fracture. CT CERVICAL SPINE FINDINGS No fracture. There is slight anterolisthesis of C3 and C4, C4-C5 and C5-C6. There is moderate loss disc height at C6-C7. Small endplate spurs are noted along the mid and lower cervical spine. There is facet degenerative change bilaterally, most evident in the mid cervical spine. Bones are diffusely demineralized. The central spinal canal and neural foramina are well preserved. Soft tissue evaluation demonstrates multiple thyroid nodules, many with calcifications. A 16 mm nodules noted on the left. The other nodules are less well-defined. There are not dense carotid vascular calcifications. Lung apices show mild scarring but are otherwise clear. IMPRESSION: HEAD CT:  No acute intracranial abnormalities. CERVICAL CT:  No fracture or acute finding. Electronically Signed   By: Lajean Manes M.D.   On: 02/17/2015 19:00   Dg Hip Operative Unilat With Pelvis Left  02/19/2015  CLINICAL DATA:  81 year old female with intra medullary nail fixation of the left hip. EXAM: OPERATIVE LEFT HIP (WITH PELVIS IF PERFORMED) 2 VIEWS TECHNIQUE: Fluoroscopic spot image(s) were submitted for interpretation post-operatively.  COMPARISON:  02/17/2015. FINDINGS: Two intraoperative fluoroscopic views of the left  hip are submitted for evaluation, which demonstrate interval placement of an intra medullary gamma nail providing fixation of the previously noted comminuted intertrochanteric hip fracture. Restoration of near anatomic alignment has been achieved. Femoral head remains located. There continues to be some displacement of the lesser trochanteric fracture medially, as noted on the preoperative examination. IMPRESSION: 1. Intraoperative documentation of intra medullary nail fixation for comminuted left intertrochanteric hip fracture, with improvement in alignment, as above. Electronically Signed   By: Vinnie Langton M.D.   On: 02/19/2015 09:19   Dg Hip Unilat With Pelvis 2-3 Views Left  02/17/2015  CLINICAL DATA:  81 year old female with history of trauma from a fall complaining of left-sided hip pain. EXAM: DG HIP (WITH OR WITHOUT PELVIS) 2-3V LEFT COMPARISON:  No priors. FINDINGS: Three views of the bony pelvis and left hip demonstrate a comminuted intertrochanteric fracture of the left hip with some proximal migration of the distal fracture fragments, and approximately 30 degrees of varus angulation. There is wide distraction of multiple fracture fragments, particularly the lesser trochanteric fracture fragment which is approximately 1.5 cm medially displaced. Bony pelvis appears grossly intact, as does the visualized portions of the right proximal femur. Left femoral head remains located. IMPRESSION: 1. Highly comminuted, displaced and angulated left intertrochanteric hip fracture, as above. Electronically Signed   By: Vinnie Langton M.D.   On: 02/17/2015 18:09    Microbiology: Recent Results (from the past 240 hour(s))  Urine culture     Status: None   Collection Time: 02/17/15  6:22 PM  Result Value Ref Range Status   Specimen Description URINE, CATHETERIZED  Final   Special Requests Normal  Final   Culture >=100,000 COLONIES/mL ESCHERICHIA COLI  Final   Report Status 02/20/2015 FINAL  Final    Organism ID, Bacteria ESCHERICHIA COLI  Final      Susceptibility   Escherichia coli - MIC*    AMPICILLIN <=2 SENSITIVE Sensitive     CEFAZOLIN <=4 SENSITIVE Sensitive     CEFTRIAXONE <=1 SENSITIVE Sensitive     CIPROFLOXACIN <=0.25 SENSITIVE Sensitive     GENTAMICIN <=1 SENSITIVE Sensitive     IMIPENEM <=0.25 SENSITIVE Sensitive     NITROFURANTOIN <=16 SENSITIVE Sensitive     TRIMETH/SULFA <=20 SENSITIVE Sensitive     AMPICILLIN/SULBACTAM <=2 SENSITIVE Sensitive     PIP/TAZO <=4 SENSITIVE Sensitive     * >=100,000 COLONIES/mL ESCHERICHIA COLI  Surgical pcr screen     Status: None   Collection Time: 02/19/15  4:02 AM  Result Value Ref Range Status   MRSA, PCR NEGATIVE NEGATIVE Final   Staphylococcus aureus NEGATIVE NEGATIVE Final    Comment:        The Xpert SA Assay (FDA approved for NASAL specimens in patients over 51 years of age), is one component of a comprehensive surveillance program.  Test performance has been validated by [facility] for patients greater than or equal to 28 year old. It is not intended to diagnose infection nor to guide or monitor treatment.      Labs: Basic Metabolic Panel:  Recent Labs Lab 02/17/15 1725 02/18/15 0406 02/19/15 0353 02/20/15 0509 02/21/15 0344  NA 142 138 140 135 137  K 3.2* 4.0 3.7 3.4* 4.3  CL 102 102 103 101 103  CO2 25 28 29 27 27   GLUCOSE 192* 141* 102* 101* 126*  BUN 17 15 15 14 20   CREATININE 0.96 0.90 0.94 1.03*  1.07*  CALCIUM 9.8 8.9 9.2 8.6* 9.0   Liver Function Tests:  Recent Labs Lab 02/18/15 0406  AST 24  ALT 19  ALKPHOS 83  BILITOT 0.6  PROT 6.1*  ALBUMIN 3.1*    Recent Labs Lab 02/18/15 1105  LIPASE 32   No results for input(s): AMMONIA in the last 168 hours. CBC:  Recent Labs Lab 02/17/15 1725 02/18/15 0406 02/19/15 0353 02/20/15 0509 02/21/15 0344  WBC 10.5 9.4 9.1 8.8 10.0  NEUTROABS  --  7.9*  --   --   --   HGB 11.7* 10.3* 10.0* 8.6* 8.7*  HCT 36.3 32.5* 31.8* 27.1* 27.6*   MCV 99.2 98.2 99.7 99.3 98.6  PLT 248 240 238 200 216   Cardiac Enzymes:  Recent Labs Lab 02/17/15 1815 02/18/15 0406  CKTOTAL 316* 224   BNP: BNP (last 3 results) No results for input(s): BNP in the last 8760 hours.  ProBNP (last 3 results) No results for input(s): PROBNP in the last 8760 hours.  CBG:  Recent Labs Lab 02/17/15 2223  GLUCAP 178*    Time coordinating discharge: 35 minutes  Signed:  Dovid Bartko  [facility] 02/21/2015, 4:05 PM

## 2015-02-22 LAB — CBC
HCT: 27.8 % — ABNORMAL LOW (ref 36.0–46.0)
Hemoglobin: 8.6 g/dL — ABNORMAL LOW (ref 12.0–15.0)
MCH: 30.7 pg (ref 26.0–34.0)
MCHC: 30.9 g/dL (ref 30.0–36.0)
MCV: 99.3 fL (ref 78.0–100.0)
Platelets: 245 10*3/uL (ref 150–400)
RBC: 2.8 MIL/uL — AB (ref 3.87–5.11)
RDW: 14.5 % (ref 11.5–15.5)
WBC: 10.2 10*3/uL (ref 4.0–10.5)

## 2015-02-22 LAB — BASIC METABOLIC PANEL
Anion gap: 6 (ref 5–15)
BUN: 21 mg/dL — AB (ref 6–20)
CHLORIDE: 104 mmol/L (ref 101–111)
CO2: 26 mmol/L (ref 22–32)
Calcium: 8.9 mg/dL (ref 8.9–10.3)
Creatinine, Ser: 0.89 mg/dL (ref 0.44–1.00)
GFR calc non Af Amer: 55 mL/min — ABNORMAL LOW (ref >60)
Glucose, Bld: 125 mg/dL — ABNORMAL HIGH (ref 65–99)
POTASSIUM: 4.9 mmol/L (ref 3.5–5.1)
SODIUM: 136 mmol/L (ref 135–145)

## 2015-02-22 NOTE — Care Management Note (Signed)
Case Management Note  Patient Details  Name: Brianna Thompson MRN: [id number] Date of Birth: [birth year]  Subjective/Objective:    Intramedullary Nail Intertrochantric left  Action/Plan: Scheduled dc to SNF-rehab, [facility].   Expected Discharge Date: 02/22/2015             Expected Discharge Plan:  [place]  In-House Referral:  Clinical Social Work  Discharge planning Services  CM Consult   Status of Service:  Completed, signed off  Medicare Important Message Given:  Yes-second notification given Date Medicare IM Given:    Medicare IM give by:    Date Additional Medicare IM Given:    Additional Medicare Important Message give by:     If discussed at [place] of Stay Meetings, dates discussed:    Additional Comments:  Erenest Rasher, RN 02/22/2015, 10:27 AM

## 2015-02-22 NOTE — Discharge Planning (Signed)
Patient to be discharged to [place]. Patient's daughter-in-law, [name], updated via phone.  [facility] Medicare SNF auth: [id number] Next review date: 02/24/2015 RUG level: RVB  Facility: [facility] [place] RN report number: [phone] Transportation: [place], [facility]: [phone] Surgical: [phone]

## 2015-02-22 NOTE — Progress Notes (Signed)
   Subjective:  Patient reports pain as mild.    Objective:   VITALS:   Filed Vitals:   02/21/15 0420 02/21/15 1346 02/21/15 2136 02/22/15 0454  BP: 115/56 121/62 150/76 136/80  Pulse: 93 85 85 76  Temp: 97.5 F (36.4 C) 97.6 F (36.4 C) 97.7 F (36.5 C) 98 F (36.7 C)  TempSrc: Oral Oral Oral Oral  Resp: 18 18 19 19   Height:      Weight:      SpO2: 94% 95% 96% 95%    Neurologically intact Neurovascular intact Sensation intact distally Intact pulses distally Dorsiflexion/Plantar flexion intact Incision: dressing C/D/I and no drainage No cellulitis present Compartment soft   Lab Results  Component Value Date   WBC 10.2 02/22/2015   HGB 8.6* 02/22/2015   HCT 27.8* 02/22/2015   MCV 99.3 02/22/2015   PLT 245 02/22/2015     Assessment/Plan:  3 Days Post-Op   - stable from ortho standpoint  Marianna Payment 02/22/2015, 7:49 AM [phone]

## 2015-02-22 NOTE — Progress Notes (Signed)
Patient being discharged to [facility] in [place]. Per CSW family will be notified. Transporting with [facility] at 1225.

## 2015-02-22 NOTE — Progress Notes (Signed)
[facility]                                                                              Patient Demographics  Brianna Thompson, is a 81 y.o. female, DOB - 1921-07-29, OHY:[id number]  Admit date - 02/17/2015   Admitting Physician Lavina Hamman, MD  Outpatient Primary MD for the patient is Leonard Downing, MD  LOS - 5   Chief Complaint  Patient presents with  . Fall  . Hip Pain  . Head Injury       Brief HPI    Brianna Thompson is a 81 y.o. female with history of atrial fibrillation on xarelto, chronic systolic CHF, diabetes mellitus, hypertension, hypothyroidism, dyslipidemia who presented with a mechanical fall. She tripped on a stool and fell to the ground hitting her head and the left side of her body. Denies LOC. She lay for 2 hours before assistance arrived. In the ER, she was found to have left hip fracture. CT head and cervical spine were stable. CXR and UA did not demonstrated infection.    Assessment & Plan    Principal Problem:   Closed left hip fracture (HCC) - Stable postoperatively, stable for discharge today to skilled nursing facility for rehabilitation - Continue xarelto for DVT prophylaxis, pain control  Active Problems:   Atrial fibrillation ([place]), CHA2DS2-VASc Score 6 - Rate controlled, continue xarelto postoperatively     Essential hypertension - Currently stable  Hypertension - Currently stable  Hypothyroidism Stable, continue Synthroid  Mild postoperative acute blood loss anemia superimposed on iron deficiency anemia - Placed on iron supplementation  Generalized debility - Continue PT OT and rehabilitation  Escherichia coli UTI Patient received Rocephin x1 and Ancef perioperatively, sensitive per the urine culture and sensitivity. Does not need any further antibiotics.   Code Status: Full code  Family Communication: Discussed in detail with the patient, all imaging results, lab results explained to the  patient    Disposition Plan: DC today to skilled nursing  Facility. DC summary done by Dr. Sheran Fava yesterday, no changes  Time Spent in minutes   15 minutes  Procedures   Intramedullary implant of the left hip on 11/6 by Dr. Frankey Shown  Consults   Orthopedic surgery  DVT Prophylaxis  xarelto  Medications  Scheduled Meds: . acetaminophen  650 mg Oral TID  . brimonidine  1 drop Both Eyes BID  . brinzolamide  1 drop Both Eyes BID  . docusate sodium  100 mg Oral BID  . feeding supplement (ENSURE ENLIVE)  237 mL Oral BID BM  . ferrous sulfate  325 mg Oral TID PC  . levothyroxine  50 mcg Oral QAC breakfast  . metoCLOPramide  5 mg Oral TID AC  . metoprolol  50 mg Oral BID  . pantoprazole  40 mg Oral Daily  . rivaroxaban  15 mg Oral Q supper  . senna  1 tablet Oral QHS  . timolol  1 drop Both Eyes BID   Continuous Infusions: . dextrose 5 % and 0.45 % NaCl with KCl 20 mEq/L 100 mL/hr at 02/21/15 0927   PRN Meds:.alum & mag hydroxide-simeth,  magnesium hydroxide, menthol-cetylpyridinium **OR** phenol, methocarbamol **OR** methocarbamol (ROBAXIN)  IV, morphine injection, ondansetron **OR** ondansetron (ZOFRAN) IV, traMADol   Antibiotics   Anti-infectives    Start     Dose/Rate Route Frequency Ordered Stop   02/19/15 1300  ceFAZolin (ANCEF) IVPB 2 g/50 mL premix     2 g 100 mL/hr over 30 Minutes Intravenous Every 6 hours 02/19/15 1213 02/20/15 0320   02/17/15 2000  cefTRIAXone (ROCEPHIN) 1 g in dextrose 5 % 50 mL IVPB     1 g 100 mL/hr over 30 Minutes Intravenous  Once 02/17/15 1959 02/17/15 2049   02/17/15 1915  metroNIDAZOLE (FLAGYL) IVPB 500 mg  Status:  Discontinued     500 mg 100 mL/hr over 60 Minutes Intravenous  Once 02/17/15 1913 02/17/15 1937        Subjective:   Brianna Thompson was seen and examined today.  Patient denies dizziness, chest pain, shortness of breath, abdominal pain, N/V/D/C, new weakness, numbess, tingling. No acute events overnight.    Objective:    Blood pressure 136/80, pulse 76, temperature 98 F (36.7 C), temperature source Oral, resp. rate 19, height 5\' 5"  (1.651 m), weight 64.5 kg (142 lb 3.2 oz), SpO2 95 %.  Wt Readings from Last 3 Encounters:  02/20/15 64.5 kg (142 lb 3.2 oz)     Intake/Output Summary (Last 24 hours) at 02/22/15 1029 Last data filed at 02/22/15 0900  Gross per 24 hour  Intake    450 ml  Output    800 ml  Net   -350 ml    Exam  General: Alert and oriented x 3, NAD  HEENT:  PERRLA, EOMI, Anicteric Sclera, mucous membranes moist.   Neck: Supple, no JVD, no masses  CVS: S1 S2 auscultated, no rubs, murmurs or gallops. Regular rate and rhythm.  Respiratory: Clear to auscultation bilaterally, no wheezing, rales or rhonchi  Abdomen: Soft, nontender, nondistended, + bowel sounds  Ext: no cyanosis clubbing or edema  Neuro: no new deficits  Skin: No rashes  Psych: Normal affect and demeanor, alert and oriented x3    Data Review   Micro Results Recent Results (from the past 240 hour(s))  Urine culture     Status: None   Collection Time: 02/17/15  6:22 PM  Result Value Ref Range Status   Specimen Description URINE, CATHETERIZED  Final   Special Requests Normal  Final   Culture >=100,000 COLONIES/mL ESCHERICHIA COLI  Final   Report Status 02/20/2015 FINAL  Final   Organism ID, Bacteria ESCHERICHIA COLI  Final      Susceptibility   Escherichia coli - MIC*    AMPICILLIN <=2 SENSITIVE Sensitive     CEFAZOLIN <=4 SENSITIVE Sensitive     CEFTRIAXONE <=1 SENSITIVE Sensitive     CIPROFLOXACIN <=0.25 SENSITIVE Sensitive     GENTAMICIN <=1 SENSITIVE Sensitive     IMIPENEM <=0.25 SENSITIVE Sensitive     NITROFURANTOIN <=16 SENSITIVE Sensitive     TRIMETH/SULFA <=20 SENSITIVE Sensitive     AMPICILLIN/SULBACTAM <=2 SENSITIVE Sensitive     PIP/TAZO <=4 SENSITIVE Sensitive     * >=100,000 COLONIES/mL ESCHERICHIA COLI  Surgical pcr screen     Status: None   Collection Time: 02/19/15  4:02 AM    Result Value Ref Range Status   MRSA, PCR NEGATIVE NEGATIVE Final   Staphylococcus aureus NEGATIVE NEGATIVE Final    Comment:        The Xpert SA Assay (FDA approved for NASAL specimens in patients over  71 years of age), is one component of a comprehensive surveillance program.  Test performance has been validated by [facility] for patients greater than or equal to 32 year old. It is not intended to diagnose infection nor to guide or monitor treatment.     Radiology Reports Dg Chest 1 View  02/17/2015  CLINICAL DATA:  81 year old who fell and sustained a comminuted intertrochanteric left femoral neck fracture. Preoperative evaluation. EXAM: CHEST 1 VIEW COMPARISON:  None. FINDINGS: Cardiac silhouette moderately enlarged. Thoracic aorta atherosclerotic. Hilar and mediastinal contours otherwise unremarkable. Mildly prominent bronchovascular markings diffusely. Lungs otherwise clear. No localized airspace consolidation. No pleural effusions. No pneumothorax. Normal pulmonary vascularity. Prominent paracardiac fat pad on the left. IMPRESSION: Cardiomegaly.  No acute cardiopulmonary disease. Electronically Signed   By: Evangeline Dakin M.D.   On: 02/17/2015 18:16   Dg Lumbar Spine Complete  02/17/2015  CLINICAL DATA:  Fall with low back pain EXAM: LUMBAR SPINE - COMPLETE 4+ VIEW COMPARISON:  None. FINDINGS: This report assumes 5 non rib-bearing lumbar vertebrae. Mild levocurvature of the lumbar spine. Lumbar vertebral body heights are preserved, with no fracture or suspicious focal osseous lesion. Mild-to-moderate degenerative disc disease throughout the lumbar spine, most prominent at L3-4. No spondylolisthesis. Moderate facet arthropathy bilaterally in the lower lumbar spine. Atherosclerotic calcifications throughout the abdominal aorta. IMPRESSION: 1. No lumbar spine fracture or spondylolisthesis. 2. Moderate degenerative changes as described. Electronically Signed   By: Ilona Sorrel M.D.    On: 02/17/2015 18:10   Ct Head Wo Contrast  02/17/2015  CLINICAL DATA:  Fall.  Complaining of headache and neck pain. EXAM: CT HEAD WITHOUT CONTRAST CT CERVICAL SPINE WITHOUT CONTRAST TECHNIQUE: Multidetector CT imaging of the head and cervical spine was performed following the standard protocol without intravenous contrast. Multiplanar CT image reconstructions of the cervical spine were also generated. COMPARISON:  None. FINDINGS: CT HEAD FINDINGS New ventricles are normal configuration. There is ventricular and sulcal enlargement reflecting moderate atrophy. There are no parenchymal masses or mass effect. There is no evidence a cortical infarct. Minor periventricular white matter hypoattenuation is noted consistent chronic microvascular ischemic change. There are no extra-axial masses or abnormal fluid collections. There is no intracranial hemorrhage. Right maxillary sinus is opacified. There is associated wall thickening. This is chronic. Remaining sinuses are clear as are the mastoid air cells. No skull fracture. CT CERVICAL SPINE FINDINGS No fracture. There is slight anterolisthesis of C3 and C4, C4-C5 and C5-C6. There is moderate loss disc height at C6-C7. Small endplate spurs are noted along the mid and lower cervical spine. There is facet degenerative change bilaterally, most evident in the mid cervical spine. Bones are diffusely demineralized. The central spinal canal and neural foramina are well preserved. Soft tissue evaluation demonstrates multiple thyroid nodules, many with calcifications. A 16 mm nodules noted on the left. The other nodules are less well-defined. There are not dense carotid vascular calcifications. Lung apices show mild scarring but are otherwise clear. IMPRESSION: HEAD CT:  No acute intracranial abnormalities. CERVICAL CT:  No fracture or acute finding. Electronically Signed   By: Lajean Manes M.D.   On: 02/17/2015 19:00   Ct Cervical Spine Wo Contrast  02/17/2015  CLINICAL  DATA:  Fall.  Complaining of headache and neck pain. EXAM: CT HEAD WITHOUT CONTRAST CT CERVICAL SPINE WITHOUT CONTRAST TECHNIQUE: Multidetector CT imaging of the head and cervical spine was performed following the standard protocol without intravenous contrast. Multiplanar CT image reconstructions of the cervical spine were also  generated. COMPARISON:  None. FINDINGS: CT HEAD FINDINGS New ventricles are normal configuration. There is ventricular and sulcal enlargement reflecting moderate atrophy. There are no parenchymal masses or mass effect. There is no evidence a cortical infarct. Minor periventricular white matter hypoattenuation is noted consistent chronic microvascular ischemic change. There are no extra-axial masses or abnormal fluid collections. There is no intracranial hemorrhage. Right maxillary sinus is opacified. There is associated wall thickening. This is chronic. Remaining sinuses are clear as are the mastoid air cells. No skull fracture. CT CERVICAL SPINE FINDINGS No fracture. There is slight anterolisthesis of C3 and C4, C4-C5 and C5-C6. There is moderate loss disc height at C6-C7. Small endplate spurs are noted along the mid and lower cervical spine. There is facet degenerative change bilaterally, most evident in the mid cervical spine. Bones are diffusely demineralized. The central spinal canal and neural foramina are well preserved. Soft tissue evaluation demonstrates multiple thyroid nodules, many with calcifications. A 16 mm nodules noted on the left. The other nodules are less well-defined. There are not dense carotid vascular calcifications. Lung apices show mild scarring but are otherwise clear. IMPRESSION: HEAD CT:  No acute intracranial abnormalities. CERVICAL CT:  No fracture or acute finding. Electronically Signed   By: Lajean Manes M.D.   On: 02/17/2015 19:00   Dg Hip Operative Unilat With Pelvis Left  02/19/2015  CLINICAL DATA:  81 year old female with intra medullary nail fixation  of the left hip. EXAM: OPERATIVE LEFT HIP (WITH PELVIS IF PERFORMED) 2 VIEWS TECHNIQUE: Fluoroscopic spot image(s) were submitted for interpretation post-operatively. COMPARISON:  02/17/2015. FINDINGS: Two intraoperative fluoroscopic views of the left hip are submitted for evaluation, which demonstrate interval placement of an intra medullary gamma nail providing fixation of the previously noted comminuted intertrochanteric hip fracture. Restoration of near anatomic alignment has been achieved. Femoral head remains located. There continues to be some displacement of the lesser trochanteric fracture medially, as noted on the preoperative examination. IMPRESSION: 1. Intraoperative documentation of intra medullary nail fixation for comminuted left intertrochanteric hip fracture, with improvement in alignment, as above. Electronically Signed   By: Vinnie Langton M.D.   On: 02/19/2015 09:19   Dg Hip Unilat With Pelvis 2-3 Views Left  02/17/2015  CLINICAL DATA:  81 year old female with history of trauma from a fall complaining of left-sided hip pain. EXAM: DG HIP (WITH OR WITHOUT PELVIS) 2-3V LEFT COMPARISON:  No priors. FINDINGS: Three views of the bony pelvis and left hip demonstrate a comminuted intertrochanteric fracture of the left hip with some proximal migration of the distal fracture fragments, and approximately 30 degrees of varus angulation. There is wide distraction of multiple fracture fragments, particularly the lesser trochanteric fracture fragment which is approximately 1.5 cm medially displaced. Bony pelvis appears grossly intact, as does the visualized portions of the right proximal femur. Left femoral head remains located. IMPRESSION: 1. Highly comminuted, displaced and angulated left intertrochanteric hip fracture, as above. Electronically Signed   By: Vinnie Langton M.D.   On: 02/17/2015 18:09    CBC  Recent Labs Lab 02/18/15 0406 02/19/15 0353 02/20/15 0509 02/21/15 0344  02/22/15 0545  WBC 9.4 9.1 8.8 10.0 10.2  HGB 10.3* 10.0* 8.6* 8.7* 8.6*  HCT 32.5* 31.8* 27.1* 27.6* 27.8*  PLT 240 238 200 216 245  MCV 98.2 99.7 99.3 98.6 99.3  MCH 31.1 31.3 31.5 31.1 30.7  MCHC 31.7 31.4 31.7 31.5 30.9  RDW 13.6 13.9 14.0 14.2 14.5  LYMPHSABS 0.9  --   --   --   --  MONOABS 0.6  --   --   --   --   EOSABS 0.0  --   --   --   --   BASOSABS 0.0  --   --   --   --     Chemistries   Recent Labs Lab 02/18/15 0406 02/19/15 0353 02/20/15 0509 02/21/15 0344 02/22/15 0545  NA 138 140 135 137 136  K 4.0 3.7 3.4* 4.3 4.9  CL 102 103 101 103 104  CO2 28 29 27 27 26   GLUCOSE 141* 102* 101* 126* 125*  BUN 15 15 14 20  21*  CREATININE 0.90 0.94 1.03* 1.07* 0.89  CALCIUM 8.9 9.2 8.6* 9.0 8.9  AST 24  --   --   --   --   ALT 19  --   --   --   --   ALKPHOS 83  --   --   --   --   BILITOT 0.6  --   --   --   --    ------------------------------------------------------------------------------------------------------------------ estimated creatinine clearance is 37 mL/min (by C-G formula based on Cr of 0.89). ------------------------------------------------------------------------------------------------------------------ No results for input(s): HGBA1C in the last 72 hours. ------------------------------------------------------------------------------------------------------------------ No results for input(s): CHOL, HDL, LDLCALC, TRIG, CHOLHDL, LDLDIRECT in the last 72 hours. ------------------------------------------------------------------------------------------------------------------ No results for input(s): TSH, T4TOTAL, T3FREE, THYROIDAB in the last 72 hours.  Invalid input(s): FREET3 ------------------------------------------------------------------------------------------------------------------ No results for input(s): VITAMINB12, FOLATE, FERRITIN, TIBC, IRON, RETICCTPCT in the last 72 hours.  Coagulation profile  Recent Labs Lab 02/17/15 2058   INR 1.47    No results for input(s): DDIMER in the last 72 hours.  Cardiac Enzymes No results for input(s): CKMB, TROPONINI, MYOGLOBIN in the last 168 hours.  Invalid input(s): CK ------------------------------------------------------------------------------------------------------------------ Invalid input(s): POCBNP  No results for input(s): GLUCAP in the last 72 hours.   RAI,RIPUDEEP M.D. [facility] 02/22/2015, 10:29 AM  Pager: [phone] Between 7am to 7pm - call Pager - [phone]  After 7pm go to [email or web address] - password [email or web address]  Call night coverage person covering after 7pm

## 2015-05-18 DIAGNOSIS — Z961 Presence of intraocular lens: Secondary | ICD-10-CM | POA: Diagnosis not present

## 2015-05-18 DIAGNOSIS — H353132 Nonexudative age-related macular degeneration, bilateral, intermediate dry stage: Secondary | ICD-10-CM | POA: Diagnosis not present

## 2015-05-18 DIAGNOSIS — H401133 Primary open-angle glaucoma, bilateral, severe stage: Secondary | ICD-10-CM | POA: Diagnosis not present

## 2015-06-06 DIAGNOSIS — J069 Acute upper respiratory infection, unspecified: Secondary | ICD-10-CM | POA: Diagnosis not present

## 2015-07-03 DIAGNOSIS — R05 Cough: Secondary | ICD-10-CM | POA: Diagnosis not present

## 2015-07-18 DIAGNOSIS — R05 Cough: Secondary | ICD-10-CM | POA: Diagnosis not present

## 2015-09-20 DIAGNOSIS — H34832 Tributary (branch) retinal vein occlusion, left eye, with macular edema: Secondary | ICD-10-CM | POA: Diagnosis not present

## 2015-09-20 DIAGNOSIS — Z961 Presence of intraocular lens: Secondary | ICD-10-CM | POA: Diagnosis not present

## 2015-09-20 DIAGNOSIS — H401133 Primary open-angle glaucoma, bilateral, severe stage: Secondary | ICD-10-CM | POA: Diagnosis not present

## 2015-09-20 DIAGNOSIS — H353211 Exudative age-related macular degeneration, right eye, with active choroidal neovascularization: Secondary | ICD-10-CM | POA: Diagnosis not present

## 2015-10-12 ENCOUNTER — Encounter (INDEPENDENT_AMBULATORY_CARE_PROVIDER_SITE_OTHER): Payer: Self-pay | Admitting: Ophthalmology

## 2015-10-23 ENCOUNTER — Encounter (INDEPENDENT_AMBULATORY_CARE_PROVIDER_SITE_OTHER): Payer: Medicare Other | Admitting: Ophthalmology

## 2015-10-23 DIAGNOSIS — H43813 Vitreous degeneration, bilateral: Secondary | ICD-10-CM

## 2015-10-23 DIAGNOSIS — H353122 Nonexudative age-related macular degeneration, left eye, intermediate dry stage: Secondary | ICD-10-CM

## 2015-10-23 DIAGNOSIS — H353211 Exudative age-related macular degeneration, right eye, with active choroidal neovascularization: Secondary | ICD-10-CM

## 2015-10-23 DIAGNOSIS — H35033 Hypertensive retinopathy, bilateral: Secondary | ICD-10-CM | POA: Diagnosis not present

## 2015-10-23 DIAGNOSIS — H34832 Tributary (branch) retinal vein occlusion, left eye, with macular edema: Secondary | ICD-10-CM | POA: Diagnosis not present

## 2015-10-23 DIAGNOSIS — I10 Essential (primary) hypertension: Secondary | ICD-10-CM | POA: Diagnosis not present

## 2015-11-06 ENCOUNTER — Encounter (INDEPENDENT_AMBULATORY_CARE_PROVIDER_SITE_OTHER): Payer: Medicare Other | Admitting: Ophthalmology

## 2015-11-06 DIAGNOSIS — I10 Essential (primary) hypertension: Secondary | ICD-10-CM

## 2015-11-06 DIAGNOSIS — H35033 Hypertensive retinopathy, bilateral: Secondary | ICD-10-CM | POA: Diagnosis not present

## 2015-11-06 DIAGNOSIS — H34832 Tributary (branch) retinal vein occlusion, left eye, with macular edema: Secondary | ICD-10-CM | POA: Diagnosis not present

## 2015-11-06 DIAGNOSIS — H43813 Vitreous degeneration, bilateral: Secondary | ICD-10-CM | POA: Diagnosis not present

## 2015-11-06 DIAGNOSIS — H353211 Exudative age-related macular degeneration, right eye, with active choroidal neovascularization: Secondary | ICD-10-CM

## 2015-11-27 ENCOUNTER — Encounter (INDEPENDENT_AMBULATORY_CARE_PROVIDER_SITE_OTHER): Payer: Medicare Other | Admitting: Ophthalmology

## 2015-11-27 DIAGNOSIS — H43813 Vitreous degeneration, bilateral: Secondary | ICD-10-CM

## 2015-11-27 DIAGNOSIS — H353211 Exudative age-related macular degeneration, right eye, with active choroidal neovascularization: Secondary | ICD-10-CM

## 2015-11-27 DIAGNOSIS — H35033 Hypertensive retinopathy, bilateral: Secondary | ICD-10-CM | POA: Diagnosis not present

## 2015-11-27 DIAGNOSIS — I10 Essential (primary) hypertension: Secondary | ICD-10-CM | POA: Diagnosis not present

## 2015-11-27 DIAGNOSIS — H34832 Tributary (branch) retinal vein occlusion, left eye, with macular edema: Secondary | ICD-10-CM

## 2015-12-05 DIAGNOSIS — S72142D Displaced intertrochanteric fracture of left femur, subsequent encounter for closed fracture with routine healing: Secondary | ICD-10-CM | POA: Diagnosis not present

## 2015-12-14 ENCOUNTER — Encounter (INDEPENDENT_AMBULATORY_CARE_PROVIDER_SITE_OTHER): Payer: Medicare Other | Admitting: Ophthalmology

## 2015-12-14 DIAGNOSIS — I10 Essential (primary) hypertension: Secondary | ICD-10-CM | POA: Diagnosis not present

## 2015-12-14 DIAGNOSIS — H353211 Exudative age-related macular degeneration, right eye, with active choroidal neovascularization: Secondary | ICD-10-CM | POA: Diagnosis not present

## 2015-12-14 DIAGNOSIS — H34832 Tributary (branch) retinal vein occlusion, left eye, with macular edema: Secondary | ICD-10-CM

## 2015-12-14 DIAGNOSIS — H43813 Vitreous degeneration, bilateral: Secondary | ICD-10-CM

## 2015-12-14 DIAGNOSIS — H35033 Hypertensive retinopathy, bilateral: Secondary | ICD-10-CM | POA: Diagnosis not present

## 2015-12-28 ENCOUNTER — Encounter (INDEPENDENT_AMBULATORY_CARE_PROVIDER_SITE_OTHER): Payer: Medicare Other | Admitting: Ophthalmology

## 2015-12-28 DIAGNOSIS — H35033 Hypertensive retinopathy, bilateral: Secondary | ICD-10-CM

## 2015-12-28 DIAGNOSIS — H353211 Exudative age-related macular degeneration, right eye, with active choroidal neovascularization: Secondary | ICD-10-CM | POA: Diagnosis not present

## 2015-12-28 DIAGNOSIS — H34832 Tributary (branch) retinal vein occlusion, left eye, with macular edema: Secondary | ICD-10-CM | POA: Diagnosis not present

## 2015-12-28 DIAGNOSIS — I10 Essential (primary) hypertension: Secondary | ICD-10-CM | POA: Diagnosis not present

## 2015-12-28 DIAGNOSIS — H43813 Vitreous degeneration, bilateral: Secondary | ICD-10-CM

## 2015-12-28 DIAGNOSIS — H353122 Nonexudative age-related macular degeneration, left eye, intermediate dry stage: Secondary | ICD-10-CM | POA: Diagnosis not present

## 2016-01-11 ENCOUNTER — Encounter (INDEPENDENT_AMBULATORY_CARE_PROVIDER_SITE_OTHER): Payer: Medicare Other | Admitting: Ophthalmology

## 2016-01-11 DIAGNOSIS — I10 Essential (primary) hypertension: Secondary | ICD-10-CM | POA: Diagnosis not present

## 2016-01-11 DIAGNOSIS — H43813 Vitreous degeneration, bilateral: Secondary | ICD-10-CM | POA: Diagnosis not present

## 2016-01-11 DIAGNOSIS — H353111 Nonexudative age-related macular degeneration, right eye, early dry stage: Secondary | ICD-10-CM

## 2016-01-11 DIAGNOSIS — H353122 Nonexudative age-related macular degeneration, left eye, intermediate dry stage: Secondary | ICD-10-CM

## 2016-01-11 DIAGNOSIS — H35033 Hypertensive retinopathy, bilateral: Secondary | ICD-10-CM | POA: Diagnosis not present

## 2016-01-11 DIAGNOSIS — H34832 Tributary (branch) retinal vein occlusion, left eye, with macular edema: Secondary | ICD-10-CM

## 2016-01-25 ENCOUNTER — Encounter (INDEPENDENT_AMBULATORY_CARE_PROVIDER_SITE_OTHER): Payer: Medicare Other | Admitting: Ophthalmology

## 2016-01-25 DIAGNOSIS — H353121 Nonexudative age-related macular degeneration, left eye, early dry stage: Secondary | ICD-10-CM

## 2016-01-25 DIAGNOSIS — H34832 Tributary (branch) retinal vein occlusion, left eye, with macular edema: Secondary | ICD-10-CM

## 2016-01-25 DIAGNOSIS — H35033 Hypertensive retinopathy, bilateral: Secondary | ICD-10-CM

## 2016-01-25 DIAGNOSIS — I10 Essential (primary) hypertension: Secondary | ICD-10-CM

## 2016-01-25 DIAGNOSIS — H43813 Vitreous degeneration, bilateral: Secondary | ICD-10-CM

## 2016-01-25 DIAGNOSIS — H353211 Exudative age-related macular degeneration, right eye, with active choroidal neovascularization: Secondary | ICD-10-CM | POA: Diagnosis not present

## 2016-01-26 DIAGNOSIS — I10 Essential (primary) hypertension: Secondary | ICD-10-CM | POA: Diagnosis not present

## 2016-01-26 DIAGNOSIS — Z23 Encounter for immunization: Secondary | ICD-10-CM | POA: Diagnosis not present

## 2016-01-26 DIAGNOSIS — Z0000 Encounter for general adult medical examination without abnormal findings: Secondary | ICD-10-CM | POA: Diagnosis not present

## 2016-01-26 DIAGNOSIS — E039 Hypothyroidism, unspecified: Secondary | ICD-10-CM | POA: Diagnosis not present

## 2016-01-26 DIAGNOSIS — I4891 Unspecified atrial fibrillation: Secondary | ICD-10-CM | POA: Diagnosis not present

## 2016-01-26 DIAGNOSIS — E785 Hyperlipidemia, unspecified: Secondary | ICD-10-CM | POA: Diagnosis not present

## 2016-01-26 LAB — CBC AND DIFFERENTIAL
HCT: 34 % — AB (ref 36–46)
HEMOGLOBIN: 12 g/dL (ref 12.0–16.0)
Platelets: 299 10*3/uL (ref 150–399)
WBC: 5.9 10*3/mL

## 2016-01-26 LAB — BASIC METABOLIC PANEL
BUN: 16 mg/dL (ref 4–21)
Creatinine: 0.9 mg/dL (ref <=1.1)
GLUCOSE: 100 mg/dL
POTASSIUM: 3.2 mmol/L — AB (ref 3.4–5.3)
Sodium: 142 mmol/L (ref 137–147)

## 2016-01-26 LAB — HEPATIC FUNCTION PANEL
ALT: 21 U/L (ref 7–35)
AST: 24 U/L (ref 13–35)
Alkaline Phosphatase: 133 U/L — AB (ref 25–125)
Bilirubin, Total: 0.6 mg/dL

## 2016-01-26 LAB — ESTIMATED GFR: EGFR (Non-African Amer.): 57

## 2016-01-26 LAB — CHLORIDE: CHLORIDE: 100 mmol/L

## 2016-01-26 LAB — PROTEIN, TOTAL: TOTAL PROTEIN: 7.1 g/dL

## 2016-01-26 LAB — TSH: TSH: 1.39 u[IU]/mL (ref <=5.90)

## 2016-01-26 LAB — CO2, TOTAL: CO2: 25

## 2016-01-26 LAB — CALCIUM: Calcium: 10.1 mg/dL

## 2016-01-26 LAB — ALBUMIN: Albumin Serum: 4.4

## 2016-02-08 ENCOUNTER — Encounter (INDEPENDENT_AMBULATORY_CARE_PROVIDER_SITE_OTHER): Payer: Medicare Other | Admitting: Ophthalmology

## 2016-02-08 DIAGNOSIS — H34832 Tributary (branch) retinal vein occlusion, left eye, with macular edema: Secondary | ICD-10-CM

## 2016-02-08 DIAGNOSIS — H353122 Nonexudative age-related macular degeneration, left eye, intermediate dry stage: Secondary | ICD-10-CM

## 2016-02-08 DIAGNOSIS — I10 Essential (primary) hypertension: Secondary | ICD-10-CM

## 2016-02-08 DIAGNOSIS — H353211 Exudative age-related macular degeneration, right eye, with active choroidal neovascularization: Secondary | ICD-10-CM | POA: Diagnosis not present

## 2016-02-08 DIAGNOSIS — H35033 Hypertensive retinopathy, bilateral: Secondary | ICD-10-CM

## 2016-02-08 DIAGNOSIS — H43813 Vitreous degeneration, bilateral: Secondary | ICD-10-CM | POA: Diagnosis not present

## 2016-02-14 DIAGNOSIS — H34832 Tributary (branch) retinal vein occlusion, left eye, with macular edema: Secondary | ICD-10-CM | POA: Diagnosis not present

## 2016-02-14 DIAGNOSIS — H401133 Primary open-angle glaucoma, bilateral, severe stage: Secondary | ICD-10-CM | POA: Diagnosis not present

## 2016-02-14 DIAGNOSIS — H353211 Exudative age-related macular degeneration, right eye, with active choroidal neovascularization: Secondary | ICD-10-CM | POA: Diagnosis not present

## 2016-02-29 ENCOUNTER — Encounter (INDEPENDENT_AMBULATORY_CARE_PROVIDER_SITE_OTHER): Payer: Medicare Other | Admitting: Ophthalmology

## 2016-02-29 DIAGNOSIS — H34832 Tributary (branch) retinal vein occlusion, left eye, with macular edema: Secondary | ICD-10-CM

## 2016-02-29 DIAGNOSIS — H353122 Nonexudative age-related macular degeneration, left eye, intermediate dry stage: Secondary | ICD-10-CM

## 2016-02-29 DIAGNOSIS — H35033 Hypertensive retinopathy, bilateral: Secondary | ICD-10-CM | POA: Diagnosis not present

## 2016-02-29 DIAGNOSIS — I10 Essential (primary) hypertension: Secondary | ICD-10-CM | POA: Diagnosis not present

## 2016-02-29 DIAGNOSIS — H43813 Vitreous degeneration, bilateral: Secondary | ICD-10-CM

## 2016-02-29 DIAGNOSIS — H353211 Exudative age-related macular degeneration, right eye, with active choroidal neovascularization: Secondary | ICD-10-CM

## 2016-03-11 ENCOUNTER — Ambulatory Visit: Payer: Medicare Other | Admitting: Family Medicine

## 2016-03-14 ENCOUNTER — Ambulatory Visit: Payer: Medicare Other | Admitting: Family Medicine

## 2016-03-18 ENCOUNTER — Encounter: Payer: Self-pay | Admitting: Family Medicine

## 2016-03-18 ENCOUNTER — Ambulatory Visit (INDEPENDENT_AMBULATORY_CARE_PROVIDER_SITE_OTHER): Payer: Medicare Other | Admitting: Family Medicine

## 2016-03-18 VITALS — BP 170/84 | HR 76 | Ht 62.0 in | Wt 122.9 lb

## 2016-03-18 DIAGNOSIS — E039 Hypothyroidism, unspecified: Secondary | ICD-10-CM

## 2016-03-18 DIAGNOSIS — I5032 Chronic diastolic (congestive) heart failure: Secondary | ICD-10-CM | POA: Diagnosis not present

## 2016-03-18 DIAGNOSIS — I482 Chronic atrial fibrillation, unspecified: Secondary | ICD-10-CM

## 2016-03-18 DIAGNOSIS — E785 Hyperlipidemia, unspecified: Secondary | ICD-10-CM

## 2016-03-18 DIAGNOSIS — Z23 Encounter for immunization: Secondary | ICD-10-CM | POA: Diagnosis not present

## 2016-03-18 DIAGNOSIS — I499 Cardiac arrhythmia, unspecified: Secondary | ICD-10-CM

## 2016-03-18 DIAGNOSIS — Z130 Encounter for screening for diseases of the blood and blood-forming organs and certain disorders involving the immune mechanism: Secondary | ICD-10-CM

## 2016-03-18 DIAGNOSIS — Z7901 Long term (current) use of anticoagulants: Secondary | ICD-10-CM

## 2016-03-18 DIAGNOSIS — H3530 Unspecified macular degeneration: Secondary | ICD-10-CM

## 2016-03-18 DIAGNOSIS — I10 Essential (primary) hypertension: Secondary | ICD-10-CM | POA: Diagnosis not present

## 2016-03-18 DIAGNOSIS — Z7189 Other specified counseling: Secondary | ICD-10-CM

## 2016-03-18 NOTE — Progress Notes (Signed)
New patient office visit note:  Impression and Recommendations:    1. Essential hypertension   2. Hypothyroidism, unspecified type   3. h/o Diastolic dysfunction with chronic heart failure ([place])   4. h/o Chronic atrial fibrillation - on Xarelto   5. Hyperlipidemia, unspecified hyperlipidemia type   6. Need for prophylactic vaccination against Streptococcus pneumoniae (pneumococcus)   7. Encounter for screening for diseases of the blood and blood-forming organs and certain disorders involving the immune mechanism   8. Chronic anticoagulation   9. Hypertension, unspecified type   10. Macular degeneration   11. h/o Irregular heart rhythm   12. Counseling on health promotion and disease prevention    Pt's total time in office visit today for 50+ minutes, with over 50% time spent in face to face counseling by myself of various medical concerns and in coordination of care. Here with DIL today.   HTN (hypertension) Not at goal BP.   Lifestyle changes such as dash diet and engaging in a regular exercise program- not matter how little-  discussed with patient.  Educational handouts provided  Ambulatory BP monitoring encouraged. Keep log and bring in next OV so we can make better decision re: your Bp and meds  Continue current medication.   Also, risks and benefits of medications discussed with patient, including alternative treatments.   Encouraged patient to read drug information handouts to further educate self about the medicine prior to starting it.   Contact us prior with any Q's/ concerns.  h/o Diastolic dysfunction with chronic heart failure (HCC) Symptoms stable, currently asymptomatic.   Asked her and daughter-in-law to get me medical records from her prior physician regarding any cardiac studies that were done, EKGs, chest x-rays, and especially stress test and echo etc.  May send pt for blood pressure management and further evaluation of her A. fib and diastolic CHF in  future to cardiology.  h/o Atrial fibrillation ([place]), CHA2DS2-VASc Score 5-6 Obtain labs near future  Hypothyroidism Cont meds for now- will check labs in future.   Counseling on health promotion and disease prevention Although it is in her medical records remotely, patient denies history of diabetes: Daughter-in-law does as well.   New Prescriptions   No medications on file   Orders Placed This Encounter  Procedures  . Pneumococcal polysaccharide vaccine 23-valent greater than or equal to 2yo subcutaneous/IM  . CBC with Differential/Platelet  . COMPLETE METABOLIC PANEL WITH GFR  . Hemoglobin A1c  . Lipid panel  . Magnesium  . Phosphorus  . T4, free  . TSH  . VITAMIN D 25 Hydroxy (Vit-D Deficiency, Fractures)  . Vitamin B12  . Microalbumin / creatinine urine ratio   The patient was counseled, risk factors were discussed, anticipatory guidance given.  Gross side effects, risk and benefits, and alternatives of medications discussed with patient.  Patient is aware that all medications have potential side effects and we are unable to predict every side effect or drug-drug interaction that may occur.  Expresses verbal understanding and consents to current therapy plan and treatment regimen.  Return in about 3 months (around 06/16/2016) for Come in near future for FBW- usual + B12; f/up sooner than planned if BP remains up.  Please see AVS handed out to patient at the end of our visit for further patient instructions/ counseling done pertaining to today's office visit.    Note: This document was prepared using Dragon voice recognition software and may include unintentional dictation errors.  ----------------------------------------------------------------------------------------------------------------------  Subjective:    Chief Complaint  Patient presents with  . Establish Care    HPI: Brianna Thompson is a pleasant 82 y.o. female who presents to [place] [facility] today to review their medical history with me and establish care.  With a prior patient of Dr. Arelia Sneddon- I do not have any of her medical records as his office visit notes are handwritten and are not legible.  - Patient denies that she has ever been seen by [facility] doctors other than her recent orthopedist visit.  Denies Cards OV etc.   I asked the patient to review their chronic problem list with me to ensure everything was updated and accurate.    Lives alone now.  Retired since age 81.  Used to enjoy cooking, quilting.   HTN-   Not on lasix despite med record!!  On Metropolol 100 MG daily- on for many yrs.    No h/o AMI/ Stroke/ heart dx etc.  Never saw Cards.  Doesn't check at home. Denies current sx  PCP started pt on Xarelto b/c she has "irreg HB's"- been on that med for at least since 2014--> never saw cards or had w/up they recall- no imagining of heart etc. Apparently it never caused sx per pt.       CHOL:   Not sure for how long.  On mevacor   HYPOTHYR:   Not sure how long or when last checked.  Tol meds well- takes QD  Constipation-  Chronic.   Not sure last COLONoscopy or GI doc etc.       Wt Readings from Last 3 Encounters:  03/18/16 122 lb 14.4 oz (55.7 kg)  02/20/15 142 lb 3.2 oz (64.5 kg)   BP Readings from Last 3 Encounters:  03/18/16 (!) 170/84  02/22/15 134/80   Pulse Readings from Last 3 Encounters:  03/18/16 76  02/22/15 78   BMI Readings from Last 3 Encounters:  03/18/16 22.48 kg/m  02/20/15 23.66 kg/m    Patient Care Team    Relationship Specialty Notifications Start End  Mellody Dance, DO PCP - General Family Medicine  01/31/16   Leandrew Koyanagi, MD Attending Physician Orthopedic Surgery  01/31/16   Hayden Pedro, MD Consulting Physician Ophthalmology  01/31/16   Calvert Cantor, MD Consulting Physician Optometry  03/18/16     Patient Active Problem List   Diagnosis Date Noted  . HLD (hyperlipidemia) 03/18/2016     Priority: High  . Chronic anticoagulation 03/18/2016    Priority: High  . HTN (hypertension) 02/17/2015    Priority: High  . h/o Atrial fibrillation ([place]), CHA2DS2-VASc Score 5-6 02/17/2015    Priority: Medium  . h/o Diastolic dysfunction with chronic heart failure ([place]) 02/17/2015    Priority: Medium  . Macular degeneration 03/18/2016    Priority: Low  . Hypothyroidism 02/17/2015    Priority: Low  . Glaucoma 02/17/2015    Priority: Low  . h/o Irregular heart rhythm 03/18/2016  . Encounter for screening for diseases of the blood and blood-forming organs and certain disorders involving the immune mechanism 03/18/2016  . Counseling on health promotion and disease prevention 03/18/2016  . Hip fracture ([place]) 02/17/2015  . Closed left hip fracture ([place]) 02/17/2015  . Fall 02/17/2015  . H/O hypokalemia 02/17/2015  . Traumatic hematoma of left hand 02/17/2015  . Elevated CPK 02/17/2015     Past Medical History:  Diagnosis Date  . Atrial fibrillation ([place])   . Glaucoma  02/17/2015  . Hypokalemia   . Hypothyroidism   . Irregular heart rhythm   . Macular degeneration      Past Medical History:  Diagnosis Date  . Atrial fibrillation ([place])   . Glaucoma 02/17/2015  . Hypokalemia   . Hypothyroidism   . Irregular heart rhythm   . Macular degeneration      Past Surgical History:  Procedure Laterality Date  . ABDOMINAL HYSTERECTOMY    . APPENDECTOMY    . CATARACT EXTRACTION Bilateral   . INTRAMEDULLARY (IM) NAIL INTERTROCHANTERIC Left 02/19/2015   Procedure: INTRAMEDULLARY (IM) NAIL INTERTROCHANTRIC;  Surgeon: Leandrew Koyanagi, MD;  Location: [facility];  Service: Orthopedics;  Laterality: Left;     Family History  Problem Relation Age of Onset  . Heart attack Father   . Diabetes Sister   . Cancer Brother     skin  . Cancer Son     prostate  . Cancer Brother     lung  . Cancer Brother     blood  . Cancer Brother     brain  . Heart attack Brother   . ALS Sister       History  Drug Use No    History  Alcohol Use No    History  Smoking Status  . Never Smoker  Smokeless Tobacco  . Never Used     Patient's Medications  New Prescriptions   No medications on file  Previous Medications   ACETAMINOPHEN (TYLENOL) 325 MG TABLET    Take 2 tablets (650 mg total) by mouth every 6 (six) hours as needed for mild pain, moderate pain, fever or headache.   BESIFLOXACIN HCL (BESIVANCE) 0.6 % SUSP    Apply 1 drop to eye as directed.   BRIMONIDINE (ALPHAGAN) 0.2 % OPHTHALMIC SOLUTION    Place 1 drop into both eyes 3 (three) times daily.   CHOLECALCIFEROL (VITAMIN D3 SUPER STRENGTH) 2000 UNITS CAPS    Take 1 capsule by mouth daily.   DOCUSATE SODIUM (COLACE) 100 MG CAPSULE    Take 1 capsule (100 mg total) by mouth 2 (two) times daily.   DORZOLAMIDE (TRUSOPT) 2 % OPHTHALMIC SOLUTION    Place 1 drop into both eyes daily.   FUROSEMIDE (LASIX) 40 MG TABLET    Take 0.5 tablets by mouth daily.   LEVOTHYROXINE (SYNTHROID, LEVOTHROID) 50 MCG TABLET    Take 50 mcg by mouth daily before breakfast.   LOVASTATIN (MEVACOR) 20 MG TABLET    Take 20 mg by mouth daily at 6 PM.   METOPROLOL (LOPRESSOR) 100 MG TABLET    Take 1 tablet by mouth daily.   MULTIPLE VITAMINS-MINERALS (PRESERVISION AREDS 2) CAPS    Take 1 capsule by mouth 2 (two) times daily.   POTASSIUM CHLORIDE (K-DUR) 10 MEQ TABLET    Take 1 tablet by mouth daily.   RIVAROXABAN (XARELTO) 15 MG TABS TABLET    Take 1 tablet (15 mg total) by mouth daily with supper.   TIMOLOL (BETIMOL) 0.5 % OPHTHALMIC SOLUTION    Place 1 drop into both eyes 2 (two) times daily.  Modified Medications   No medications on file  Discontinued Medications   BRINZOLAMIDE-BRIMONIDINE (SIMBRINZA) 1-0.2 % SUSP    Apply 1 drop to eye 2 (two) times daily.   FEEDING SUPPLEMENT, ENSURE ENLIVE, (ENSURE ENLIVE) LIQD    Take 237 mLs by mouth 2 (two) times daily between meals.   FERROUS SULFATE 325 (65 FE) MG TABLET    Take 1  tablet (325 mg  total) by mouth daily with breakfast.   IBUPROFEN (ADVIL,MOTRIN) 200 MG TABLET    Take 400 mg by mouth every 6 (six) hours as needed for moderate pain.   METOPROLOL (LOPRESSOR) 100 MG TABLET    Take 100 mg by mouth 2 (two) times daily.   TRAMADOL (ULTRAM) 50 MG TABLET    Take 1 tablet (50 mg total) by mouth every 6 (six) hours as needed for moderate pain or severe pain.    Allergies: Calcium-containing compounds  Review of Systems  Constitutional: Negative.  Negative for chills, diaphoresis, fever, malaise/fatigue and weight loss.  HENT: Negative.  Negative for congestion, sore throat and tinnitus.   Eyes: Negative.  Negative for blurred vision, double vision and photophobia.  Respiratory: Negative.  Negative for cough and wheezing.   Cardiovascular: Negative.  Negative for chest pain and palpitations.  Gastrointestinal: Negative.  Negative for blood in stool, diarrhea, nausea and vomiting.  Genitourinary: Negative.  Negative for dysuria, frequency and urgency.  Musculoskeletal: Negative.  Negative for joint pain and myalgias.  Skin: Negative.  Negative for itching and rash.  Neurological: Negative.  Negative for dizziness, focal weakness, weakness ( ) and headaches.  Endo/Heme/Allergies: Negative.  Negative for environmental allergies and polydipsia. Does not bruise/bleed easily.  Psychiatric/Behavioral: Negative.  Negative for depression and memory loss. The patient is not nervous/anxious and does not have insomnia.      Objective:    Blood pressure (!) 170/84, pulse 76, height 5\' 2"  (1.575 m), weight 122 lb 14.4 oz (55.7 kg). Body mass index is 22.48 kg/m. General: Well Developed, well nourished, and in no acute distress.  Neuro: Alert and oriented x3, extra-ocular muscles intact, sensation grossly intact.  HEENT: Normocephalic, atraumatic, pupils equal round reactive to light, neck supple, no brutis Skin: no gross rashes  Cardiac: Regular rate and rhythm today, NO  M,G,R Respiratory: Essentially clear to auscultation bilaterally. Not using accessory muscles, speaking in full sentences.  Abdominal: not grossly distended Musculoskeletal: Ambulates w/o diff, FROM * 4 ext.  Vasc: less 2 sec cap RF, warm and pink, b/l lext w chronic venous stasis changes and pitting edema >er L than R due to hip sx Psych:  No HI/SI, judgement and insight good, Euthymic mood. Full Affect.

## 2016-03-18 NOTE — Assessment & Plan Note (Signed)
Obtain labs near future 

## 2016-03-18 NOTE — Assessment & Plan Note (Addendum)
Not at goal BP.   Lifestyle changes such as dash diet and engaging in a regular exercise program- not matter how little-  discussed with patient.  Educational handouts provided  Ambulatory BP monitoring encouraged. Keep log and bring in next OV so we can make better decision re: your Bp and meds  Continue current medication.   Also, risks and benefits of medications discussed with patient, including alternative treatments.   Encouraged patient to read drug information handouts to further educate self about the medicine prior to starting it.   Contact us prior with any Q's/ concerns.

## 2016-03-18 NOTE — Assessment & Plan Note (Signed)
Although it is in her medical records remotely, patient denies history of diabetes: Daughter-in-law does as well.

## 2016-03-18 NOTE — Patient Instructions (Signed)
Blood pressure goal would be 150/90 or little less on a regular basis. If remains elevated near where it is today, told him to come in sooner than planned.  Please check at home and write it down- bring in next office visit.  - Please also bring in any medical records you have at home such as when your last mammogram was, colonoscopy was, bone density etc.     Guidelines for a Low Sodium Diet   Low Sodium Diet A main source of sodium is table salt. The average American eats five or more teaspoons of salt each day. This is about 20 times as much as the body needs. In fact, your body needs only 1/4 teaspoon of salt every day. Sodium is found naturally in foods, but a lot of it is added during processing and preparation. Many foods that do not taste salty may still be high in sodium. Large amounts of sodium can be hidden in canned, processed and convenience foods. And sodium can be found in many foods that are served at [employer].  Sodium controls fluid balance in our bodies and maintains blood volume and blood pressure. Eating too much sodium may raise blood pressure and cause fluid retention, which could lead to swelling of the legs and feet or other health issues.  When limiting sodium in your diet, a common target is to eat less than 2,000 milligrams of sodium per day.   General Guidelines for Cutting Down on Salt Eliminate salty foods from your diet and reduce the amount of salt used in cooking. Sea salt is no better than regular salt.  Choose low sodium foods. Many salt-free or reduced salt products are available. When reading food labels, low sodium is defined as 140 mg of sodium per serving.  Salt substitutes are sometimes made from potassium, so read the label. If you are on a low potassium diet, then check with your doctor before using those salt substitutes.  Be creative and season your foods with spices, herbs, lemon, garlic, ginger, vinegar and pepper. Remove the salt  shaker from the table.  Read ingredient labels to identify foods high in sodium. Items with 400 mg or more of sodium are high in sodium. High sodium food additives include salt, brine, or other items that say sodium, such as monosodium glutamate.  Eat more home-cooked meals. Foods cooked from scratch are naturally lower in sodium than most instant and boxed mixes.  Don't use softened water for cooking and drinking since it contains added salt.  Avoid medications which contain sodium such as [employer].  For more information; food composition books are available which tell how much sodium is in food. Online sources such as www.calorieking.com also list amounts.     Meats, Poultry, Fish, Legumes, Eggs and Nuts  High-Sodium Foods: Smoked, cured, salted or canned meat, fish or poultry including bacon, cold cuts, ham, frankfurters, sausage, sardines, caviar and anchovies Frozen breaded meats and dinners, such as burritos and pizza Canned entrees, such as ravioli, spam and chili Salted nuts Beans canned with salt added  Low-Sodium Alternatives: Any fresh or frozen beef, lamb, pork, poultry and fish Eggs and egg substitutes Low-sodium peanut butter Dry peas and beans (not canned) Low-sodium canned fish Drained, water or oil packed canned fish or poultry   Dairy Products  High-Sodium Foods: Buttermilk Regular and processed cheese, cheese spreads and sauces Cottage cheese  Low-Sodium Alternatives: Milk, yogurt, ice cream and ice milk Low-sodium cheeses, cream cheese,  ricotta cheese and mozzarella   Breads, Grains and Cereals  High-Sodium Foods: Bread and rolls with salted tops Quick breads, self-rising flour, biscuit, pancake and waffle mixes Pizza, croutons and salted crackers Prepackaged, processed mixes for potatoes, rice, pasta and stuffing  Low-Sodium Alternatives: Breads, bagels and rolls without salted tops Muffins and most ready-to-eat  cereals All rice and pasta, but do not to add salt when cooking Low-sodium corn and flour tortillas and noodles Low-sodium crackers and breadsticks Unsalted popcorn, chips and pretzels      Vegetables and Fruits  High-Sodium Foods: Regular canned vegetables and vegetable juices Olives, pickles, sauerkraut and other pickled vegetables Vegetables made with ham, bacon or salted pork Packaged mixes, such as scalloped or au gratin potatoes, frozen hash browns and Tater Tots Commercially prepared pasta and tomato sauces and salsa  Low-Sodium Alternatives: Fresh and frozen vegetables without sauces Low-sodium canned vegetables, sauces and juices Fresh potatoes, frozen [place] fries and instant mashed potatoes Low-salt tomato or V-8 juice. Most fresh, frozen and canned fruit Dried fruits   Soups  High-Sodium Foods: Regular canned and dehydrated soup, broth and bouillon Cup of noodles and seasoned ramen mixes  Low-Sodium Alternatives: Low-sodium canned and dehydrated soups, broth and bouillon Homemade soups without added salt   Fats, Desserts and Sweets  High-Sodium Foods: Soy sauce, seasoning salt, other sauces and marinades Bottled salad dressings, regular salad dressing with bacon bits Salted butter or margarine Instant pudding and cake Large portions of ketchup, mustard  Low-Sodium Alternatives: Vinegar, unsalted butter or margarine Vegetable oils and low sodium sauces and salad dressings Mayonnaise All desserts made without salt     Managing Your Hypertension Hypertension is commonly called high blood pressure. Blood pressure is a measurement of how strongly your blood is pressing against the walls of your arteries. Arteries are blood vessels that carry blood from your heart throughout your body. Blood pressure does not stay the same. It rises when you are active, excited, or nervous. It lowers when you are sleeping or relaxed. If the numbers that measure your  blood pressure stay above normal most of the time, you are at risk for health problems. Hypertension is a long-term (chronic) condition in which blood pressure is elevated. This condition often has no signs or symptoms. The cause of the condition is usually not known. What are blood pressure readings? A blood pressure reading is recorded as two numbers, such as "120 over 80" (or 120/80). The first ("top") number is called the systolic pressure. It is a measure of the pressure in your arteries as the heart beats. The second ("bottom") number is called the diastolic pressure. It is a measure of the pressure in your arteries as the heart relaxes between beats. What does my blood pressure reading mean? Blood pressure is classified into four stages. Based on your blood pressure reading, your health care provider may use the following stages to determine what type of treatment, if any, is needed. What health risks are associated with hypertension? Managing your hypertension is an important responsibility. Uncontrolled hypertension can lead to:  A heart attack.  A stroke.  A weakened blood vessel (aneurysm).  Heart failure.  Kidney damage.  Eye damage.  Metabolic syndrome.  Memory and concentration problems. What changes can I make to manage my hypertension? Hypertension can be managed effectively by making lifestyle changes and possibly by taking medicines. Your health care provider will help you come up with a plan to bring your blood pressure within a normal range. Your  plan should include the following: Monitoring  Monitor your blood pressure at home as told by your health care provider. Your personal target blood pressure may vary depending on your medical conditions, your age, and other factors.  Have your blood pressure rechecked as told by your health care provider. Lifestyle  Lose weight if necessary.  Get at least 30-45 minutes of aerobic exercise at least 4 times a week.  Do  not use any products that contain nicotine or tobacco, such as cigarettes and e-cigarettes. If you need help quitting, ask your health care provider.  Learn ways to reduce stress.  Control any chronic conditions, such as high cholesterol or diabetes. Eating and drinking  Follow the DASH diet. This diet is high in fruits, vegetables, and whole grains. It is low in salt, red meat, and added sugars.  Keep your sodium intake below 2,300 mg per day.  Limit alcoholic beverages. Communication  Review all the medicines you take with your health care provider because there may be side effects or interactions.  Talk with your health care provider about your diet, exercise habits, and other lifestyle factors that may be contributing to hypertension.  See your health care provider regularly. Your health care provider can help you create and adjust your plan for managing hypertension. Will I need medicine to control my blood pressure? Your health care provider may prescribe medicine if lifestyle changes are not enough to get your blood pressure under control, and if one of the following is true:  You are 60-16 years of age, and your systolic blood pressure is 140 or higher.  You are 46 years of age or older, and your systolic blood pressure is 150 or higher.  Your diastolic blood pressure is 90 or higher.  You have diabetes, and your systolic blood pressure is over [id number] or your diastolic blood pressure is over 90.  You have kidney disease, and your blood pressure is above 140/90.  You have heart disease or a history of stroke, and your blood pressure is 140/90 or higher. Take medicines only as told by your health care provider. Follow the directions carefully. Blood pressure medicines must be taken as prescribed. The medicine does not work as well when you skip doses. Skipping doses also puts you at risk for problems. Contact a health care provider if:  You think you are having a reaction to  medicines you have taken.  You have repeated (recurrent) headaches.  You feel dizzy.  You have swelling in your ankles.  You have trouble with your vision. Get help right away if:  You develop a severe headache or confusion.  You have unusual weakness or numbness, or you feel faint.  You have severe pain in your chest or abdomen.  You vomit repeatedly.  You have trouble breathing. This information is not intended to replace advice given to you by your health care provider. Make sure you discuss any questions you have with your health care provider. Document Released: 12/25/2011 Document Revised: 12/05/2015 Document Reviewed: 06/30/2015 Elsevier Interactive Patient Education  2017 [employer].

## 2016-03-18 NOTE — Assessment & Plan Note (Addendum)
Symptoms stable, currently asymptomatic.   Asked her and daughter-in-law to get me medical records from her prior physician regarding any cardiac studies that were done, EKGs, chest x-rays, and especially stress test and echo etc.  May send pt for blood pressure management and further evaluation of her A. fib and diastolic CHF in future to cardiology.

## 2016-03-18 NOTE — Assessment & Plan Note (Signed)
Cont meds for now- will check labs in future.

## 2016-03-21 ENCOUNTER — Other Ambulatory Visit (INDEPENDENT_AMBULATORY_CARE_PROVIDER_SITE_OTHER): Payer: Medicare Other

## 2016-03-21 DIAGNOSIS — E039 Hypothyroidism, unspecified: Secondary | ICD-10-CM | POA: Diagnosis not present

## 2016-03-21 DIAGNOSIS — Z23 Encounter for immunization: Secondary | ICD-10-CM | POA: Diagnosis not present

## 2016-03-21 DIAGNOSIS — I482 Chronic atrial fibrillation, unspecified: Secondary | ICD-10-CM

## 2016-03-21 DIAGNOSIS — Z130 Encounter for screening for diseases of the blood and blood-forming organs and certain disorders involving the immune mechanism: Secondary | ICD-10-CM | POA: Diagnosis not present

## 2016-03-21 DIAGNOSIS — I10 Essential (primary) hypertension: Secondary | ICD-10-CM

## 2016-03-21 DIAGNOSIS — E785 Hyperlipidemia, unspecified: Secondary | ICD-10-CM

## 2016-03-21 DIAGNOSIS — R739 Hyperglycemia, unspecified: Secondary | ICD-10-CM | POA: Diagnosis not present

## 2016-03-21 DIAGNOSIS — Z7901 Long term (current) use of anticoagulants: Secondary | ICD-10-CM

## 2016-03-22 LAB — CBC WITH DIFFERENTIAL/PLATELET
BASOS ABS: 0 {cells}/uL (ref 0–200)
Basophils Relative: 0 %
EOS PCT: 1 %
Eosinophils Absolute: 65 cells/uL (ref 15–500)
HCT: 35.8 % (ref 35.0–45.0)
Hemoglobin: 12 g/dL (ref 11.7–15.5)
LYMPHS ABS: 1560 {cells}/uL (ref 850–3900)
LYMPHS PCT: 24 %
MCH: 33.8 pg — AB (ref 27.0–33.0)
MCHC: 33.5 g/dL (ref 32.0–36.0)
MCV: 100.8 fL — ABNORMAL HIGH (ref 80.0–100.0)
MONOS PCT: 9 %
MPV: 9.1 fL (ref 7.5–12.5)
Monocytes Absolute: 585 cells/uL (ref 200–950)
NEUTROS PCT: 66 %
Neutro Abs: 4290 cells/uL (ref 1500–7800)
PLATELETS: 272 10*3/uL (ref 140–400)
RBC: 3.55 MIL/uL — AB (ref 3.80–5.10)
RDW: 15.2 % — AB (ref 11.0–15.0)
WBC: 6.5 10*3/uL (ref 3.8–10.8)

## 2016-03-22 LAB — VITAMIN B12: VITAMIN B 12: 487 pg/mL (ref 200–1100)

## 2016-03-23 LAB — COMPLETE METABOLIC PANEL WITH GFR
ALBUMIN: 4 g/dL (ref 3.6–5.1)
ALK PHOS: 97 U/L (ref 33–130)
ALT: 13 U/L (ref 6–29)
AST: 15 U/L (ref 10–35)
BUN: 19 mg/dL (ref 7–25)
CHLORIDE: 107 mmol/L (ref 98–110)
CO2: 26 mmol/L (ref 20–31)
Calcium: 9.6 mg/dL (ref 8.6–10.4)
Creat: 0.88 mg/dL (ref 0.60–0.88)
GFR, EST NON AFRICAN AMERICAN: 57 mL/min — AB (ref >=60)
GFR, Est African American: 66 mL/min (ref >=60)
GLUCOSE: 106 mg/dL — AB (ref 65–99)
POTASSIUM: 4.1 mmol/L (ref 3.5–5.3)
SODIUM: 143 mmol/L (ref 135–146)
Total Bilirubin: 0.6 mg/dL (ref 0.2–1.2)
Total Protein: 6.8 g/dL (ref 6.1–8.1)

## 2016-03-23 LAB — MAGNESIUM: Magnesium: 1.8 mg/dL (ref 1.5–2.5)

## 2016-03-23 LAB — VITAMIN D 25 HYDROXY (VIT D DEFICIENCY, FRACTURES): Vit D, 25-Hydroxy: 34 ng/mL (ref 30–100)

## 2016-03-23 LAB — T4, FREE: FREE T4: 1.1 ng/dL (ref 0.8–1.8)

## 2016-03-23 LAB — MICROALBUMIN / CREATININE URINE RATIO
Creatinine, Urine: 110 mg/dL (ref 20–320)
MICROALB/CREAT RATIO: 18 ug/mg{creat} (ref <30)
Microalb, Ur: 2 mg/dL

## 2016-03-23 LAB — HEMOGLOBIN A1C
Hgb A1c MFr Bld: 6.3 % — ABNORMAL HIGH (ref <5.7)
Mean Plasma Glucose: 134 mg/dL

## 2016-03-23 LAB — TSH: TSH: 1.55 mIU/L

## 2016-03-23 LAB — LIPID PANEL
CHOLESTEROL: 143 mg/dL (ref <200)
HDL: 54 mg/dL (ref >50)
LDL Cholesterol: 68 mg/dL (ref <100)
TRIGLYCERIDES: 104 mg/dL (ref <150)
Total CHOL/HDL Ratio: 2.6 Ratio (ref <5.0)
VLDL: 21 mg/dL (ref <30)

## 2016-03-23 LAB — PHOSPHORUS: Phosphorus: 3.3 mg/dL (ref 2.1–4.3)

## 2016-03-28 ENCOUNTER — Encounter (INDEPENDENT_AMBULATORY_CARE_PROVIDER_SITE_OTHER): Payer: Medicare Other | Admitting: Ophthalmology

## 2016-03-28 DIAGNOSIS — H353211 Exudative age-related macular degeneration, right eye, with active choroidal neovascularization: Secondary | ICD-10-CM | POA: Diagnosis not present

## 2016-03-28 DIAGNOSIS — H43813 Vitreous degeneration, bilateral: Secondary | ICD-10-CM | POA: Diagnosis not present

## 2016-03-28 DIAGNOSIS — H35033 Hypertensive retinopathy, bilateral: Secondary | ICD-10-CM | POA: Diagnosis not present

## 2016-03-28 DIAGNOSIS — H353122 Nonexudative age-related macular degeneration, left eye, intermediate dry stage: Secondary | ICD-10-CM

## 2016-03-28 DIAGNOSIS — H34832 Tributary (branch) retinal vein occlusion, left eye, with macular edema: Secondary | ICD-10-CM

## 2016-03-28 DIAGNOSIS — I10 Essential (primary) hypertension: Secondary | ICD-10-CM

## 2016-03-29 ENCOUNTER — Other Ambulatory Visit: Payer: Self-pay

## 2016-03-29 MED ORDER — FUROSEMIDE 40 MG PO TABS: 20.0000 mg | ORAL_TABLET | Freq: Every day | ORAL | 0 refills | Status: DC

## 2016-04-02 DIAGNOSIS — R739 Hyperglycemia, unspecified: Secondary | ICD-10-CM | POA: Insufficient documentation

## 2016-04-25 ENCOUNTER — Encounter (INDEPENDENT_AMBULATORY_CARE_PROVIDER_SITE_OTHER): Payer: Medicare Other | Admitting: Ophthalmology

## 2016-04-25 DIAGNOSIS — H35033 Hypertensive retinopathy, bilateral: Secondary | ICD-10-CM

## 2016-04-25 DIAGNOSIS — H34832 Tributary (branch) retinal vein occlusion, left eye, with macular edema: Secondary | ICD-10-CM | POA: Diagnosis not present

## 2016-04-25 DIAGNOSIS — I10 Essential (primary) hypertension: Secondary | ICD-10-CM

## 2016-04-25 DIAGNOSIS — H353231 Exudative age-related macular degeneration, bilateral, with active choroidal neovascularization: Secondary | ICD-10-CM | POA: Diagnosis not present

## 2016-04-25 DIAGNOSIS — H43813 Vitreous degeneration, bilateral: Secondary | ICD-10-CM

## 2016-04-30 ENCOUNTER — Other Ambulatory Visit: Payer: Self-pay

## 2016-04-30 MED ORDER — RIVAROXABAN 15 MG PO TABS: 15.0000 mg | ORAL_TABLET | Freq: Every day | ORAL | 0 refills | Status: DC

## 2016-04-30 NOTE — Telephone Encounter (Signed)
Will need to get from cards.

## 2016-04-30 NOTE — Telephone Encounter (Signed)
Received refill request for Xarelto.  Spoke with pt's daughter in law who states that previous PCP started this medication for AF and pt has never seen a cardiologist to evaluate/treat.  Do you want to give a temporary supply and refer to cardiology or you manage this medication?  Please advise.  Charyl Bigger, CMA

## 2016-04-30 NOTE — Addendum Note (Signed)
Addended by: Fonnie Mu on: 04/30/2016 05:14 PM   Modules accepted: Orders

## 2016-04-30 NOTE — Telephone Encounter (Signed)
After speaking with Dr. Raliegh Scarlet, she verbally authorized refill of Xarelto as she did not realize pt does not have a cardiologist.  Will need to speak with daughter in law regarding any cardiac symptoms or if she has an interest in seeing cardiology for further treatment plan.  Charyl Bigger, CMA

## 2016-05-02 NOTE — Telephone Encounter (Signed)
Thanks for calling pt to see if she would like to see Cards or not.

## 2016-05-03 NOTE — Telephone Encounter (Signed)
Pt's daughter in law states that pt does not have any symptoms and states that pt does not wish to see a cardiologist.  Charyl Bigger, CMA

## 2016-05-15 ENCOUNTER — Other Ambulatory Visit: Payer: Self-pay

## 2016-05-15 MED ORDER — METOPROLOL TARTRATE 100 MG PO TABS: 100.0000 mg | ORAL_TABLET | Freq: Every day | ORAL | 0 refills | Status: DC

## 2016-05-23 ENCOUNTER — Encounter (INDEPENDENT_AMBULATORY_CARE_PROVIDER_SITE_OTHER): Payer: Medicare Other | Admitting: Ophthalmology

## 2016-05-23 DIAGNOSIS — H35033 Hypertensive retinopathy, bilateral: Secondary | ICD-10-CM | POA: Diagnosis not present

## 2016-05-23 DIAGNOSIS — H34832 Tributary (branch) retinal vein occlusion, left eye, with macular edema: Secondary | ICD-10-CM | POA: Diagnosis not present

## 2016-05-23 DIAGNOSIS — H43813 Vitreous degeneration, bilateral: Secondary | ICD-10-CM | POA: Diagnosis not present

## 2016-05-23 DIAGNOSIS — H353211 Exudative age-related macular degeneration, right eye, with active choroidal neovascularization: Secondary | ICD-10-CM

## 2016-05-23 DIAGNOSIS — I10 Essential (primary) hypertension: Secondary | ICD-10-CM

## 2016-05-29 ENCOUNTER — Other Ambulatory Visit: Payer: Self-pay

## 2016-05-29 MED ORDER — POTASSIUM CHLORIDE ER 10 MEQ PO TBCR: 10.0000 meq | EXTENDED_RELEASE_TABLET | Freq: Every day | ORAL | 0 refills | Status: DC

## 2016-06-12 ENCOUNTER — Other Ambulatory Visit: Payer: Self-pay

## 2016-06-12 MED ORDER — LOVASTATIN 20 MG PO TABS: 20.0000 mg | ORAL_TABLET | Freq: Every day | ORAL | 0 refills | Status: DC

## 2016-06-18 ENCOUNTER — Ambulatory Visit: Payer: Medicare Other | Admitting: Family Medicine

## 2016-06-20 ENCOUNTER — Encounter (INDEPENDENT_AMBULATORY_CARE_PROVIDER_SITE_OTHER): Payer: Medicare Other | Admitting: Ophthalmology

## 2016-06-20 DIAGNOSIS — H353122 Nonexudative age-related macular degeneration, left eye, intermediate dry stage: Secondary | ICD-10-CM

## 2016-06-20 DIAGNOSIS — H43813 Vitreous degeneration, bilateral: Secondary | ICD-10-CM

## 2016-06-20 DIAGNOSIS — H34832 Tributary (branch) retinal vein occlusion, left eye, with macular edema: Secondary | ICD-10-CM

## 2016-06-20 DIAGNOSIS — H35033 Hypertensive retinopathy, bilateral: Secondary | ICD-10-CM | POA: Diagnosis not present

## 2016-06-20 DIAGNOSIS — H353211 Exudative age-related macular degeneration, right eye, with active choroidal neovascularization: Secondary | ICD-10-CM | POA: Diagnosis not present

## 2016-06-20 DIAGNOSIS — I10 Essential (primary) hypertension: Secondary | ICD-10-CM | POA: Diagnosis not present

## 2016-06-26 ENCOUNTER — Other Ambulatory Visit: Payer: Self-pay

## 2016-06-26 MED ORDER — LEVOTHYROXINE SODIUM 50 MCG PO TABS: 50.0000 ug | ORAL_TABLET | Freq: Every day | ORAL | 0 refills | Status: DC

## 2016-06-26 NOTE — Telephone Encounter (Signed)
We have not prescribed this medication for the patient in the past.  Please refill if appropriate.  Charyl Bigger, CMA

## 2016-07-08 ENCOUNTER — Other Ambulatory Visit: Payer: Self-pay | Admitting: Family Medicine

## 2016-07-22 ENCOUNTER — Other Ambulatory Visit: Payer: Self-pay | Admitting: Family Medicine

## 2016-07-25 ENCOUNTER — Encounter (INDEPENDENT_AMBULATORY_CARE_PROVIDER_SITE_OTHER): Payer: Medicare Other | Admitting: Ophthalmology

## 2016-07-25 DIAGNOSIS — H353211 Exudative age-related macular degeneration, right eye, with active choroidal neovascularization: Secondary | ICD-10-CM | POA: Diagnosis not present

## 2016-07-25 DIAGNOSIS — H43813 Vitreous degeneration, bilateral: Secondary | ICD-10-CM

## 2016-07-25 DIAGNOSIS — H34832 Tributary (branch) retinal vein occlusion, left eye, with macular edema: Secondary | ICD-10-CM

## 2016-07-25 DIAGNOSIS — I10 Essential (primary) hypertension: Secondary | ICD-10-CM | POA: Diagnosis not present

## 2016-07-25 DIAGNOSIS — H35033 Hypertensive retinopathy, bilateral: Secondary | ICD-10-CM

## 2016-07-31 ENCOUNTER — Encounter: Payer: Self-pay | Admitting: Adult Health

## 2016-07-31 ENCOUNTER — Ambulatory Visit (INDEPENDENT_AMBULATORY_CARE_PROVIDER_SITE_OTHER): Payer: Medicare Other | Admitting: Adult Health

## 2016-07-31 DIAGNOSIS — I10 Essential (primary) hypertension: Secondary | ICD-10-CM

## 2016-07-31 DIAGNOSIS — I482 Chronic atrial fibrillation, unspecified: Secondary | ICD-10-CM

## 2016-07-31 NOTE — Assessment & Plan Note (Signed)
Continue Toprol 100mg  daily  Continue Furosemide 1/2 40mg  tablet. Please check you BP daily and then call clinic in 7 days with the information. If BP remains above goal and with bil ankle edema-will consider increasing Furosemide to full 40mg  dose daily. Pt. Reports her CBP has been as high as 240s with her other PCP

## 2016-07-31 NOTE — Progress Notes (Signed)
Subjective:    Patient ID: Brianna Thompson, female    DOB: [birth year], 83 y.o.   MRN: [id number]  HPI:  Brianna Thompson is here for f/u regarding HTN, HLD, and A fib.  She reports "doing alright" and denies any current/acute complaints.  She reports medication compliance and denies SE. She lives independently, however her daughter-in-law and grandson live next door and check-in on her several times daily.  She reports being able to "get around with my cane".  She has hx of A fib and is controlled on Toprol 100mg  and xarelto 15mg  daily and denies having a cardiologist.  She had a large hamburger prior to OV today and her BP is elevated 189/85.  She denies CP/dyspnea/palpitations.  [name] at [facility] during encounter.  Patient Care Team    Relationship Specialty Notifications Start End  Mellody Dance, DO PCP - General Family Medicine  01/31/16   Leandrew Koyanagi, MD Attending Physician Orthopedic Surgery  01/31/16   Hayden Pedro, MD Consulting Physician Ophthalmology  01/31/16   Calvert Cantor, MD Consulting Physician Optometry  03/18/16     Patient Active Problem List   Diagnosis Date Noted  . Blood glucose elevated 04/02/2016  . HLD (hyperlipidemia) 03/18/2016  . Chronic anticoagulation 03/18/2016  . Macular degeneration 03/18/2016  . h/o Irregular heart rhythm 03/18/2016  . Encounter for screening for diseases of the blood and blood-forming organs and certain disorders involving the immune mechanism 03/18/2016  . Counseling on health promotion and disease prevention 03/18/2016  . Hip fracture ([place]) 02/17/2015  . Closed left hip fracture ([place]) 02/17/2015  . h/o Atrial fibrillation ([place]), CHA2DS2-VASc Score 5-6 02/17/2015  . HTN (hypertension) 02/17/2015  . Fall 02/17/2015  . H/O hypokalemia 02/17/2015  . Traumatic hematoma of left hand 02/17/2015  . Hypothyroidism 63/04/6008  . h/o Diastolic dysfunction with chronic heart failure ([place]) 02/17/2015  . Elevated CPK 02/17/2015  . Glaucoma  02/17/2015     Past Medical History:  Diagnosis Date  . Atrial fibrillation ([place])   . Glaucoma 02/17/2015  . Hypokalemia   . Hypothyroidism   . Irregular heart rhythm   . Macular degeneration      Past Surgical History:  Procedure Laterality Date  . ABDOMINAL HYSTERECTOMY    . APPENDECTOMY    . CATARACT EXTRACTION Bilateral   . INTRAMEDULLARY (IM) NAIL INTERTROCHANTERIC Left 02/19/2015   Procedure: INTRAMEDULLARY (IM) NAIL INTERTROCHANTRIC;  Surgeon: Leandrew Koyanagi, MD;  Location: [place] [place];  Service: Orthopedics;  Laterality: Left;     Family History  Problem Relation Age of Onset  . Heart attack Father   . Diabetes Sister   . Cancer Brother     skin  . Cancer Son     prostate  . Cancer Brother     lung  . Cancer Brother     blood  . Cancer Brother     brain  . Heart attack Brother   . ALS Sister      History  Drug Use No     History  Alcohol Use No     History  Smoking Status  . Never Smoker  Smokeless Tobacco  . Never Used     Outpatient Encounter Prescriptions as of 07/31/2016  Medication Sig Note  . acetaminophen (TYLENOL) 325 MG tablet Take 2 tablets (650 mg total) by mouth every 6 (six) hours as needed for mild pain, moderate pain, fever or headache.   . Besifloxacin HCl (BESIVANCE) 0.6 % SUSP Apply  1 drop to eye as directed.   . brimonidine (ALPHAGAN) 0.2 % ophthalmic solution Place 1 drop into both eyes 3 (three) times daily. 03/18/2016: Received from: External Pharmacy  . Cholecalciferol (VITAMIN D3 SUPER STRENGTH) 2000 units CAPS Take 1 capsule by mouth daily.   Marland Kitchen docusate sodium (COLACE) 100 MG capsule Take 1 capsule (100 mg total) by mouth 2 (two) times daily.   . dorzolamide (TRUSOPT) 2 % ophthalmic solution Place 1 drop into both eyes daily. 03/18/2016: Received from: External Pharmacy  . furosemide (LASIX) 40 MG tablet TAKE 1/2 TABLET (20 MG TOTAL) BY MOUTH DAILY.   Marland Kitchen levothyroxine (SYNTHROID, LEVOTHROID) 50 MCG tablet Take 1 tablet (50 mcg  total) by mouth daily before breakfast.   . lovastatin (MEVACOR) 20 MG tablet Take 1 tablet (20 mg total) by mouth daily at 6 PM.   . metoprolol (LOPRESSOR) 100 MG tablet Take 1 tablet (100 mg total) by mouth daily.   . Multiple Vitamins-Minerals (PRESERVISION AREDS 2) CAPS Take 1 capsule by mouth 2 (two) times daily.   . potassium chloride (K-DUR) 10 MEQ tablet Take 1 tablet (10 mEq total) by mouth daily.   . timolol (BETIMOL) 0.5 % ophthalmic solution Place 1 drop into both eyes 2 (two) times daily.   Alveda Reasons 15 MG TABS tablet TAKE 1 TABLET BY MOUTH DAILY WITH SUPPER.    No facility-administered encounter medications on file as of 07/31/2016.     Allergies: Calcium-containing compounds  Body mass index is 23.16 kg/m.  Blood pressure (!) 189/85, pulse 80, height 5\' 2"  (1.575 m), weight 126 lb 9.6 oz (57.4 kg).     Review of Systems  Constitutional: Positive for fatigue. Negative for activity change, appetite change, chills, diaphoresis, fever and unexpected weight change.  Respiratory: Negative for cough, chest tightness, shortness of breath, wheezing and stridor.   Cardiovascular: Positive for leg swelling. Negative for chest pain and palpitations.  Gastrointestinal: Positive for constipation. Negative for abdominal distention, diarrhea, nausea and vomiting.  Endocrine: Negative for cold intolerance, heat intolerance, polydipsia, polyphagia and polyuria.  Genitourinary: Negative for difficulty urinating and flank pain.  Musculoskeletal: Positive for arthralgias, back pain, gait problem, joint swelling, myalgias, neck pain and neck stiffness.  Skin: Negative for color change, pallor, rash and wound.  Allergic/Immunologic: Negative for immunocompromised state.  Neurological: Negative for dizziness, tremors, weakness, light-headedness and headaches.  Hematological: Does not bruise/bleed easily.  Psychiatric/Behavioral: Negative for agitation, dysphoric mood, self-injury, sleep  disturbance and suicidal ideas. The patient is not nervous/anxious.        Objective:   Physical Exam  Constitutional: She appears well-developed and well-nourished. No distress.  HENT:  Head: Normocephalic and atraumatic.  Right Ear: External ear normal. Decreased hearing is noted.  Left Ear: External ear normal. Decreased hearing is noted.  Eyes: Conjunctivae are normal. Pupils are equal, round, and reactive to light.  Neck: Normal range of motion.  Cardiovascular: Normal rate, regular rhythm, normal heart sounds and intact distal pulses.   No murmur heard. Pulmonary/Chest: Effort normal and breath sounds normal. No respiratory distress. She has no wheezes. She has no rales. She exhibits no tenderness.  Musculoskeletal:       Right ankle: She exhibits swelling.       Left ankle: She exhibits swelling.       Lumbar back: She exhibits pain.  Lymphadenopathy:    She has no cervical adenopathy.  Neurological: She is alert.  Skin: Skin is warm and dry. No rash noted. She is  not diaphoretic. No erythema. No pallor.  Psychiatric: She has a normal mood and affect. Her behavior is normal. Judgment and thought content normal.  Nursing note and vitals reviewed.         Assessment & Plan:   1. Chronic atrial fibrillation ([place])   2. Hypertension, unspecified type     h/o Atrial fibrillation ([place]), CHA2DS2-VASc Score 5-6 Continue Xarelto 15mg  daily. Denies unusual bleeding/bruising. Not followed by cards.   HR today 80  HTN (hypertension) Continue Toprol 100mg  daily  Continue Furosemide 1/2 40mg  tablet. Please check you BP daily and then call clinic in 7 days with the information. If BP remains above goal and with bil ankle edema-will consider increasing Furosemide to full 40mg  dose daily. Pt. Reports her CBP has been as high as 240s with her other PCP    FOLLOW-UP:  Return in about 3 months (around 10/30/2016) for Regular Follow Up, HTN, Atrial Fibrillation.

## 2016-07-31 NOTE — Patient Instructions (Signed)
Heart-Healthy Eating Plan Many factors influence your heart health, including eating and exercise habits. Heart (coronary) risk increases with abnormal blood fat (lipid) levels. Heart-healthy meal planning includes limiting unhealthy fats, increasing healthy fats, and making other small dietary changes. This includes maintaining a healthy body weight to help keep lipid levels within a normal range. What is my plan? Your health care provider recommends that you:  Get no more than _________% of the total calories in your daily diet from fat.  Limit your intake of saturated fat to less than _________% of your total calories each day.  Limit the amount of cholesterol in your diet to less than _________ mg per day. What types of fat should I choose?  Choose healthy fats more often. Choose monounsaturated and polyunsaturated fats, such as olive oil and canola oil, flaxseeds, walnuts, almonds, and seeds.  Eat more omega-3 fats. Good choices include salmon, mackerel, sardines, tuna, flaxseed oil, and ground flaxseeds. Aim to eat fish at least two times each week.  Limit saturated fats. Saturated fats are primarily found in animal products, such as meats, butter, and cream. Plant sources of saturated fats include palm oil, palm kernel oil, and coconut oil.  Avoid foods with partially hydrogenated oils in them. These contain trans fats. Examples of foods that contain trans fats are stick margarine, some tub margarines, cookies, crackers, and other baked goods. What general guidelines do I need to follow?  Check food labels carefully to identify foods with trans fats or high amounts of saturated fat.  Fill one half of your plate with vegetables and green salads. Eat 4-5 servings of vegetables per day. A serving of vegetables equals 1 cup of raw leafy vegetables,  cup of raw or cooked cut-up vegetables, or  cup of vegetable juice.  Fill one fourth of your plate with whole grains. Look for the word  "whole" as the first word in the ingredient list.  Fill one fourth of your plate with lean protein foods.  Eat 4-5 servings of fruit per day. A serving of fruit equals one medium whole fruit,  cup of dried fruit,  cup of fresh, frozen, or canned fruit, or  cup of 100% fruit juice.  Eat more foods that contain soluble fiber. Examples of foods that contain this type of fiber are apples, broccoli, carrots, beans, peas, and barley. Aim to get 20-30 g of fiber per day.  Eat more home-cooked food and less restaurant, buffet, and fast food.  Limit or avoid alcohol.  Limit foods that are high in starch and sugar.  Avoid fried foods.  Cook foods by using methods other than frying. Baking, boiling, grilling, and broiling are all great options. Other fat-reducing suggestions include:  Removing the skin from poultry.  Removing all visible fats from meats.  Skimming the fat off of stews, soups, and gravies before serving them.  Steaming vegetables in water or broth.  Lose weight if you are overweight. Losing just 5-10% of your initial body weight can help your overall health and prevent diseases such as diabetes and heart disease.  Increase your consumption of nuts, legumes, and seeds to 4-5 servings per week. One serving of dried beans or legumes equals  cup after being cooked, one serving of nuts equals 1 ounces, and one serving of seeds equals  ounce or 1 tablespoon.  You may need to monitor your salt (sodium) intake, especially if you have high blood pressure. Talk with your health care provider or dietitian to get more  information about reducing sodium. What foods can I eat? Grains   Breads, including [place], white, pita, wheat, raisin, rye, oatmeal, and [place]. Tortillas that are neither fried nor made with lard or trans fat. Low-fat rolls, including hotdog and hamburger buns and English muffins. Biscuits. Muffins. Waffles. Pancakes. Light popcorn. Whole-grain cereals. Flatbread.  Melba toast. Pretzels. Breadsticks. Rusks. Low-fat snacks and crackers, including oyster, saltine, matzo, graham, animal, and rye. Rice and pasta, including [place] rice and those that are made with whole wheat. Vegetables  All vegetables. Fruits  All fruits, but limit coconut. Meats and Other Protein Sources  Lean, well-trimmed beef, veal, pork, and lamb. Chicken and [place] without skin. All fish and shellfish. Wild duck, rabbit, pheasant, and venison. Egg whites or low-cholesterol egg substitutes. Dried beans, peas, lentils, and tofu.Seeds and most nuts. Dairy  Low-fat or nonfat cheeses, including ricotta, string, and mozzarella. Skim or 1% milk that is liquid, powdered, or evaporated. Buttermilk that is made with low-fat milk. Nonfat or low-fat yogurt. Beverages  Mineral water. Diet carbonated beverages. Sweets and Desserts  Sherbets and fruit ices. Honey, jam, marmalade, jelly, and syrups. Meringues and gelatins. Pure sugar candy, such as hard candy, jelly beans, gumdrops, mints, marshmallows, and small amounts of dark chocolate. [employer] Eat all sweets and desserts in moderation. Fats and Oils  Nonhydrogenated (trans-free) margarines. Vegetable oils, including soybean, sesame, sunflower, olive, peanut, safflower, corn, canola, and cottonseed. Salad dressings or mayonnaise that are made with a vegetable oil. Limit added fats and oils that you use for cooking, baking, salads, and as spreads. Other  Cocoa powder. Coffee and tea. All seasonings and condiments. The items listed above may not be a complete list of recommended foods or beverages. Contact your dietitian for more options.  What foods are not recommended? Grains  Breads that are made with saturated or trans fats, oils, or whole milk. Croissants. Butter rolls. Cheese breads. Sweet rolls. Donuts. Buttered popcorn. Chow mein noodles. High-fat crackers, such as cheese or butter crackers. Meats and Other Protein Sources  Fatty  meats, such as hotdogs, short ribs, sausage, spareribs, bacon, ribeye roast or steak, and mutton. High-fat deli meats, such as salami and bologna. Caviar. Domestic duck and goose. Organ meats, such as kidney, liver, sweetbreads, brains, gizzard, chitterlings, and heart. Dairy  Cream, sour cream, cream cheese, and creamed cottage cheese. Whole milk cheeses, including blue (bleu), Monte[place] Jack, [place], [place], American, [place], Swiss, [place], [place], and [place]. Whole or 2% milk that is liquid, evaporated, or condensed. Whole buttermilk. Cream sauce or high-fat cheese sauce. Yogurt that is made from whole milk. Beverages  Regular sodas and drinks with added sugar. Sweets and Desserts  Frosting. Pudding. Cookies. Cakes other than angel food cake. Candy that has milk chocolate or white chocolate, hydrogenated fat, butter, coconut, or unknown ingredients. Buttered syrups. Full-fat ice cream or ice cream drinks. Fats and Oils  Gravy that has suet, meat fat, or shortening. Cocoa butter, hydrogenated oils, palm oil, coconut oil, palm kernel oil. These can often be found in baked products, candy, fried foods, nondairy creamers, and whipped toppings. Solid fats and shortenings, including bacon fat, salt pork, lard, and butter. Nondairy cream substitutes, such as coffee creamers and sour cream substitutes. Salad dressings that are made of unknown oils, cheese, or sour cream. The items listed above may not be a complete list of foods and beverages to avoid. Contact your dietitian for more information.  This information is not intended to replace advice given to you by  your health care provider. Make sure you discuss any questions you have with your health care provider. Document Released: 01/09/2008 Document Revised: 10/20/2015 Document Reviewed: 09/23/2013 Elsevier Interactive Patient Education  2017 [employer].   Hypertension Hypertension, commonly called high blood pressure, is when the force of blood  pumping through the arteries is too strong. The arteries are the blood vessels that carry blood from the heart throughout the body. Hypertension forces the heart to work harder to pump blood and may cause arteries to become narrow or stiff. Having untreated or uncontrolled hypertension can cause heart attacks, strokes, kidney disease, and other problems. A blood pressure reading consists of a higher number over a lower number. Ideally, your blood pressure should be below 120/80. The first ("top") number is called the systolic pressure. It is a measure of the pressure in your arteries as your heart beats. The second ("bottom") number is called the diastolic pressure. It is a measure of the pressure in your arteries as the heart relaxes. What are the causes? The cause of this condition is not known. What increases the risk? Some risk factors for high blood pressure are under your control. Others are not. Factors you can change   Smoking.  Having type 2 diabetes mellitus, high cholesterol, or both.  Not getting enough exercise or physical activity.  Being overweight.  Having too much fat, sugar, calories, or salt (sodium) in your diet.  Drinking too much alcohol. Factors that are difficult or impossible to change   Having chronic kidney disease.  Having a family history of high blood pressure.  Age. Risk increases with age.  Race. You may be at higher risk if you are African-American.  Gender. Men are at higher risk than women before age 34. After age 11, women are at higher risk than men.  Having obstructive sleep apnea.  Stress. What are the signs or symptoms? Extremely high blood pressure (hypertensive crisis) may cause:  Headache.  Anxiety.  Shortness of breath.  Nosebleed.  Nausea and vomiting.  Severe chest pain.  Jerky movements you cannot control (seizures). How is this diagnosed? This condition is diagnosed by measuring your blood pressure while you are  seated, with your arm resting on a surface. The cuff of the blood pressure monitor will be placed directly against the skin of your upper arm at the level of your heart. It should be measured at least twice using the same arm. Certain conditions can cause a difference in blood pressure between your right and left arms. Certain factors can cause blood pressure readings to be lower or higher than normal (elevated) for a short period of time:  When your blood pressure is higher when you are in a health care provider's office than when you are at home, this is called white coat hypertension. Most people with this condition do not need medicines.  When your blood pressure is higher at home than when you are in a health care provider's office, this is called masked hypertension. Most people with this condition may need medicines to control blood pressure. If you have a high blood pressure reading during one visit or you have normal blood pressure with other risk factors:  You may be asked to return on a different day to have your blood pressure checked again.  You may be asked to monitor your blood pressure at home for 1 week or longer. If you are diagnosed with hypertension, you may have other blood or imaging tests to help your  health care provider understand your overall risk for other conditions. How is this treated? This condition is treated by making healthy lifestyle changes, such as eating healthy foods, exercising more, and reducing your alcohol intake. Your health care provider may prescribe medicine if lifestyle changes are not enough to get your blood pressure under control, and if:  Your systolic blood pressure is above 130.  Your diastolic blood pressure is above 80. Your personal target blood pressure may vary depending on your medical conditions, your age, and other factors. Follow these instructions at home: Eating and drinking   Eat a diet that is high in fiber and potassium, and  low in sodium, added sugar, and fat. An example eating plan is called the DASH (Dietary Approaches to Stop Hypertension) diet. To eat this way:  Eat plenty of fresh fruits and vegetables. Try to fill half of your plate at each meal with fruits and vegetables.  Eat whole grains, such as whole wheat pasta, brown rice, or whole grain bread. Fill about one quarter of your plate with whole grains.  Eat or drink low-fat dairy products, such as skim milk or low-fat yogurt.  Avoid fatty cuts of meat, processed or cured meats, and poultry with skin. Fill about one quarter of your plate with lean proteins, such as fish, chicken without skin, beans, eggs, and tofu.  Avoid premade and processed foods. These tend to be higher in sodium, added sugar, and fat.  Reduce your daily sodium intake. Most people with hypertension should eat less than 1,500 mg of sodium a day.  Limit alcohol intake to no more than 1 drink a day for nonpregnant women and 2 drinks a day for men. One drink equals 12 oz of beer, 5 oz of wine, or 1 oz of hard liquor. Lifestyle   Work with your health care provider to maintain a healthy body weight or to lose weight. Ask what an ideal weight is for you.  Get at least 30 minutes of exercise that causes your heart to beat faster (aerobic exercise) most days of the week. Activities may include walking, swimming, or biking.  Include exercise to strengthen your muscles (resistance exercise), such as pilates or lifting weights, as part of your weekly exercise routine. Try to do these types of exercises for 30 minutes at least 3 days a week.  Do not use any products that contain nicotine or tobacco, such as cigarettes and e-cigarettes. If you need help quitting, ask your health care provider.  Monitor your blood pressure at home as told by your health care provider.  Keep all follow-up visits as told by your health care provider. This is important. Medicines   Take over-the-counter and  prescription medicines only as told by your health care provider. Follow directions carefully. Blood pressure medicines must be taken as prescribed.  Do not skip doses of blood pressure medicine. Doing this puts you at risk for problems and can make the medicine less effective.  Ask your health care provider about side effects or reactions to medicines that you should watch for. Contact a health care provider if:  You think you are having a reaction to a medicine you are taking.  You have headaches that keep coming back (recurring).  You feel dizzy.  You have swelling in your ankles.  You have trouble with your vision. Get help right away if:  You develop a severe headache or confusion.  You have unusual weakness or numbness.  You feel faint.  You  have severe pain in your chest or abdomen.  You vomit repeatedly.  You have trouble breathing. Summary  Hypertension is when the force of blood pumping through your arteries is too strong. If this condition is not controlled, it may put you at risk for serious complications.  Your personal target blood pressure may vary depending on your medical conditions, your age, and other factors. For most people, a normal blood pressure is less than 120/80.  Hypertension is treated with lifestyle changes, medicines, or a combination of both. Lifestyle changes include weight loss, eating a healthy, low-sodium diet, exercising more, and limiting alcohol. This information is not intended to replace advice given to you by your health care provider. Make sure you discuss any questions you have with your health care provider. Document Released: 04/01/2005 Document Revised: 02/28/2016 Document Reviewed: 02/28/2016 Elsevier Interactive Patient Education  2017 [employer].  Please continue all medications as directed. Please check your BP daily for one week then call clinic with information. If BP remains elevated we may adjust your  dosages. Follow-up in 3 months, sooner If needed.

## 2016-07-31 NOTE — Assessment & Plan Note (Signed)
Continue Xarelto 15mg  daily. Denies unusual bleeding/bruising. Not followed by cards.   HR today 80

## 2016-08-08 ENCOUNTER — Telehealth: Payer: Self-pay

## 2016-08-08 NOTE — Telephone Encounter (Signed)
Please have pt cont current regimen of meds.   Tell her to really try to avoid salt and salty foods.   Cont w/ home BP  Monitoring and f/up as discussed last OV.

## 2016-08-08 NOTE — Telephone Encounter (Signed)
Pt's daughter in law brought in home BP readings for pt. Readings were:  07/31/16     180/108 @ 2:05pm   162/94 @ 5:14pm  08/01/16 149/80 @ 9:45am  08/02/16 129/66 @ 12:20pm  08/03/16 122/70 @ 11:39am  08/04/16 149/84 @ 2:04pm  45/23/18 117/62 @12 :30pm  08/06/16 142/70 @ 2:55pm  08/07/16 142/81 @ 12:30pm  Charyl Bigger, CMA

## 2016-08-12 ENCOUNTER — Other Ambulatory Visit: Payer: Self-pay | Admitting: Adult Health

## 2016-08-14 DIAGNOSIS — H34832 Tributary (branch) retinal vein occlusion, left eye, with macular edema: Secondary | ICD-10-CM | POA: Diagnosis not present

## 2016-08-14 DIAGNOSIS — Z961 Presence of intraocular lens: Secondary | ICD-10-CM | POA: Diagnosis not present

## 2016-08-14 DIAGNOSIS — H401133 Primary open-angle glaucoma, bilateral, severe stage: Secondary | ICD-10-CM | POA: Diagnosis not present

## 2016-08-14 DIAGNOSIS — H353212 Exudative age-related macular degeneration, right eye, with inactive choroidal neovascularization: Secondary | ICD-10-CM | POA: Diagnosis not present

## 2016-08-15 ENCOUNTER — Encounter: Payer: Self-pay | Admitting: Family Medicine

## 2016-08-15 ENCOUNTER — Ambulatory Visit (INDEPENDENT_AMBULATORY_CARE_PROVIDER_SITE_OTHER): Payer: Medicare Other | Admitting: Family Medicine

## 2016-08-15 VITALS — BP 142/78 | HR 68 | Temp 97.8°F | Ht 62.0 in | Wt 124.8 lb

## 2016-08-15 DIAGNOSIS — Z85828 Personal history of other malignant neoplasm of skin: Secondary | ICD-10-CM | POA: Insufficient documentation

## 2016-08-15 DIAGNOSIS — Z23 Encounter for immunization: Secondary | ICD-10-CM

## 2016-08-15 DIAGNOSIS — I10 Essential (primary) hypertension: Secondary | ICD-10-CM | POA: Diagnosis not present

## 2016-08-15 DIAGNOSIS — L989 Disorder of the skin and subcutaneous tissue, unspecified: Secondary | ICD-10-CM | POA: Diagnosis not present

## 2016-08-15 DIAGNOSIS — S81802A Unspecified open wound, left lower leg, initial encounter: Secondary | ICD-10-CM | POA: Diagnosis not present

## 2016-08-15 MED ORDER — CEPHALEXIN 250 MG PO CAPS: 250.0000 mg | ORAL_CAPSULE | Freq: Three times a day (TID) | ORAL | 0 refills | Status: AC

## 2016-08-15 NOTE — Progress Notes (Signed)
Impression and Recommendations:    1. Wound of left lower extremity, initial encounter   2. Skin lesion   3. History of skin cancer   4. Hypertension, unspecified type    - referral to Derm - for excision/ bx and definitive diag; esp since grew so quickly and pt with a hx of "skin CA"- not sure what diagnosis was - Keflex - avoid scratching--> use bandaid at night to avoid excoriation/ further tissue damage - if dev F/C/ ill feelings--> call us please   New Prescriptions   CEPHALEXIN (KEFLEX) 250 MG CAPSULE    Take 1 capsule (250 mg total) by mouth 3 (three) times daily.     Meds ordered this encounter  Medications  . cephALEXin (KEFLEX) 250 MG capsule    Sig: Take 1 capsule (250 mg total) by mouth 3 (three) times daily.    Dispense:  30 capsule    Refill:  0     Discontinued Medications   No medications on file     Orders Placed This Encounter  Procedures  . Tdap vaccine greater than or equal to 7yo IM  . Ambulatory referral to Dermatology   Gross side effects, risk and benefits, and alternatives of medications and treatment plan in general discussed with patient.  Patient is aware that all medications have potential side effects and we are unable to predict every side effect or drug-drug interaction that may occur.   Patient will call with any questions prior to using medication if they have concerns.  Expresses verbal understanding and consents to current therapy and treatment regimen.  No barriers to understanding were identified.  Red flag symptoms and signs discussed in detail.  Patient expressed understanding regarding what to do in case of emergency\urgent symptoms  Please see AVS handed out to patient at the end of our visit for further patient instructions/ counseling done pertaining to today's office visit.   Return if symptoms worsen or fail to improve, for f/up chornic care as d/c pt prior, .     Note: This document was prepared using Dragon voice  recognition software and may include unintentional dictation errors.   --------------------------------------------------------------------------------------------------------------------------------------------------------------------------------------------------------------------------------------------    Subjective:    CC:  Chief Complaint  Patient presents with  . Leg ulcer    HPI: Brianna Thompson is a 83 y.o. female who presents to [place] at [facility] today for issues as discussed below.   2-3 wks noticed skin growth L Lower ext.  No tracking, no edema. No h/o MRSA.  NO F/C, no illness feelings, no N/V/D    Bp at home is all over the place. 397- 673'A systolic usually.   DIL [name] took pt's BP every day for one week about c wks ago--> was WNL's usually less the 193'X systolic.  etc   Wt Readings from Last 3 Encounters:  08/15/16 124 lb 12 oz (56.6 kg)  07/31/16 126 lb 9.6 oz (57.4 kg)  03/18/16 122 lb 14.4 oz (55.7 kg)   BP Readings from Last 3 Encounters:  08/15/16 (!) 142/78  07/31/16 (!) 189/85  03/18/16 (!) 170/84   Pulse Readings from Last 3 Encounters:  08/15/16 68  07/31/16 80  03/18/16 76   BMI Readings from Last 3 Encounters:  08/15/16 22.82 kg/m  07/31/16 23.16 kg/m  03/18/16 22.48 kg/m     Patient Care Team    Relationship Specialty Notifications Start End  Mellody Dance, DO PCP - General Family Medicine  01/31/16   Leandrew Koyanagi, MD Attending Physician Orthopedic Surgery  01/31/16   Hayden Pedro, MD Consulting Physician Ophthalmology  01/31/16   Calvert Cantor, MD Consulting Physician Optometry  03/18/16      Patient Active Problem List   Diagnosis Date Noted  . HLD (hyperlipidemia) 03/18/2016    Priority: High  . Chronic anticoagulation 03/18/2016    Priority: High  . HTN (hypertension) 02/17/2015    Priority: High  . h/o Atrial fibrillation ([place]), CHA2DS2-VASc Score 5-6 02/17/2015    Priority: Medium  . h/o  Diastolic dysfunction with chronic heart failure ([place]) 02/17/2015    Priority: Medium  . Macular degeneration 03/18/2016    Priority: Low  . Hypothyroidism 02/17/2015    Priority: Low  . Glaucoma 02/17/2015    Priority: Low  . Blood glucose elevated 04/02/2016  . h/o Irregular heart rhythm 03/18/2016  . Encounter for screening for diseases of the blood and blood-forming organs and certain disorders involving the immune mechanism 03/18/2016  . Counseling on health promotion and disease prevention 03/18/2016  . Hip fracture ([place]) 02/17/2015  . Closed left hip fracture ([place]) 02/17/2015  . Fall 02/17/2015  . H/O hypokalemia 02/17/2015  . Traumatic hematoma of left hand 02/17/2015  . Elevated CPK 02/17/2015    Past Medical history, Surgical history, Family history, Social history, Allergies and Medications have been entered into the medical record, reviewed and changed as needed.    Current Meds  Medication Sig  . acetaminophen (TYLENOL) 325 MG tablet Take 2 tablets (650 mg total) by mouth every 6 (six) hours as needed for mild pain, moderate pain, fever or headache.  . Besifloxacin HCl (BESIVANCE) 0.6 % SUSP Apply 1 drop to eye as directed.  . brimonidine (ALPHAGAN) 0.2 % ophthalmic solution Place 1 drop into both eyes 3 (three) times daily.  . Cholecalciferol (VITAMIN D3 SUPER STRENGTH) 2000 units CAPS Take 1 capsule by mouth daily.  Marland Kitchen docusate sodium (COLACE) 100 MG capsule Take 1 capsule (100 mg total) by mouth 2 (two) times daily.  . dorzolamide (TRUSOPT) 2 % ophthalmic solution Place 1 drop into both eyes daily.  . furosemide (LASIX) 40 MG tablet TAKE 1/2 TABLET (20 MG TOTAL) BY MOUTH DAILY.  Marland Kitchen levothyroxine (SYNTHROID, LEVOTHROID) 50 MCG tablet Take 1 tablet (50 mcg total) by mouth daily before breakfast.  . lovastatin (MEVACOR) 20 MG tablet Take 1 tablet (20 mg total) by mouth daily at 6 PM.  . metoprolol (LOPRESSOR) 100 MG tablet TAKE 1 TABLET BY MOUTH DAILY.  . Multiple  Vitamins-Minerals (PRESERVISION AREDS 2) CAPS Take 1 capsule by mouth 2 (two) times daily.  . potassium chloride (K-DUR) 10 MEQ tablet Take 1 tablet (10 mEq total) by mouth daily.  . timolol (BETIMOL) 0.5 % ophthalmic solution Place 1 drop into both eyes 2 (two) times daily.  Alveda Reasons 15 MG TABS tablet TAKE 1 TABLET BY MOUTH DAILY WITH SUPPER.    Allergies:  Allergies  Allergen Reactions  . Calcium-Containing Compounds Other (See Comments)    constipation     Review of Systems: General:   Denies fever, chills, unexplained weight loss.  Optho/Auditory:   Denies visual changes, blurred vision/LOV Respiratory:   Denies wheeze, DOE more than baseline levels.  Cardiovascular:   Denies chest pain, palpitations, new onset peripheral edema  Gastrointestinal:   Denies nausea, vomiting, diarrhea, abd pain.  Genitourinary: Denies dysuria, freq/ urgency, flank pain or discharge from genitals.  Endocrine:     Denies hot or cold  intolerance, polyuria, polydipsia. Musculoskeletal:   Denies unexplained myalgias, joint swelling, unexplained arthralgias, gait problems.  Skin:  Denies new onset rash, + suspicious lesion L L Ext Neurological:     Denies dizziness, unexplained weakness, numbness  Psychiatric/Behavioral:   Denies mood changes, suicidal or homicidal ideations, hallucinations       Objective:   Blood pressure (!) 142/78, pulse 68, temperature 97.8 F (36.6 C), temperature source Oral, height 5\' 2"  (1.575 m), weight 124 lb 12 oz (56.6 kg). Body mass index is 22.82 kg/m. General:  Well Developed, well nourished, appropriate for stated age.  Neuro:  Alert and oriented,  extra-ocular muscles intact  HEENT:  Normocephalic, atraumatic, neck supple, no carotid bruits appreciated  Skin:  warm, pink, about a 1.0-1.5cm lesion L anterior shin. + hyperpigmented and lichenified skin lesion, slight inc erythema surrounding it about 3cm; no tracking Cardiac:  RRR, S1 S2 Respiratory:  ECTA B/L  and A/P, Not using accessory muscles, speaking in full sentences- unlabored. Vascular:  Ext warm, no cyanosis apprec.; cap RF less 2 sec. Psych:  No HI/SI, judgement and insight good, Euthymic mood. Full Affect.

## 2016-08-15 NOTE — Patient Instructions (Signed)

## 2016-08-16 ENCOUNTER — Encounter (INDEPENDENT_AMBULATORY_CARE_PROVIDER_SITE_OTHER): Payer: Medicare Other | Admitting: Ophthalmology

## 2016-08-21 ENCOUNTER — Other Ambulatory Visit: Payer: Self-pay | Admitting: Family Medicine

## 2016-08-22 ENCOUNTER — Encounter (INDEPENDENT_AMBULATORY_CARE_PROVIDER_SITE_OTHER): Payer: Medicare Other | Admitting: Ophthalmology

## 2016-08-22 DIAGNOSIS — H34832 Tributary (branch) retinal vein occlusion, left eye, with macular edema: Secondary | ICD-10-CM | POA: Diagnosis not present

## 2016-08-22 DIAGNOSIS — H43813 Vitreous degeneration, bilateral: Secondary | ICD-10-CM

## 2016-08-22 DIAGNOSIS — I10 Essential (primary) hypertension: Secondary | ICD-10-CM | POA: Diagnosis not present

## 2016-08-22 DIAGNOSIS — H35033 Hypertensive retinopathy, bilateral: Secondary | ICD-10-CM

## 2016-08-22 DIAGNOSIS — H353211 Exudative age-related macular degeneration, right eye, with active choroidal neovascularization: Secondary | ICD-10-CM

## 2016-09-10 ENCOUNTER — Other Ambulatory Visit: Payer: Self-pay | Admitting: Adult Health

## 2016-09-19 ENCOUNTER — Encounter (INDEPENDENT_AMBULATORY_CARE_PROVIDER_SITE_OTHER): Payer: Medicare Other | Admitting: Ophthalmology

## 2016-09-19 DIAGNOSIS — R234 Changes in skin texture: Secondary | ICD-10-CM | POA: Diagnosis not present

## 2016-09-19 DIAGNOSIS — H35033 Hypertensive retinopathy, bilateral: Secondary | ICD-10-CM

## 2016-09-19 DIAGNOSIS — I10 Essential (primary) hypertension: Secondary | ICD-10-CM | POA: Diagnosis not present

## 2016-09-19 DIAGNOSIS — H34832 Tributary (branch) retinal vein occlusion, left eye, with macular edema: Secondary | ICD-10-CM | POA: Diagnosis not present

## 2016-09-19 DIAGNOSIS — H43813 Vitreous degeneration, bilateral: Secondary | ICD-10-CM

## 2016-09-19 DIAGNOSIS — H353211 Exudative age-related macular degeneration, right eye, with active choroidal neovascularization: Secondary | ICD-10-CM | POA: Diagnosis not present

## 2016-09-19 DIAGNOSIS — D485 Neoplasm of uncertain behavior of skin: Secondary | ICD-10-CM | POA: Diagnosis not present

## 2016-09-19 DIAGNOSIS — H353122 Nonexudative age-related macular degeneration, left eye, intermediate dry stage: Secondary | ICD-10-CM | POA: Diagnosis not present

## 2016-09-30 ENCOUNTER — Other Ambulatory Visit: Payer: Self-pay | Admitting: Adult Health

## 2016-10-05 ENCOUNTER — Other Ambulatory Visit: Payer: Self-pay | Admitting: Adult Health

## 2016-10-07 ENCOUNTER — Ambulatory Visit (INDEPENDENT_AMBULATORY_CARE_PROVIDER_SITE_OTHER): Payer: Medicare Other | Admitting: Family Medicine

## 2016-10-07 ENCOUNTER — Encounter: Payer: Self-pay | Admitting: Family Medicine

## 2016-10-07 VITALS — BP 152/84 | HR 87 | Ht 62.0 in | Wt 125.2 lb

## 2016-10-07 DIAGNOSIS — S81802D Unspecified open wound, left lower leg, subsequent encounter: Secondary | ICD-10-CM

## 2016-10-07 DIAGNOSIS — L570 Actinic keratosis: Secondary | ICD-10-CM

## 2016-10-07 DIAGNOSIS — X32XXXA Exposure to sunlight, initial encounter: Secondary | ICD-10-CM | POA: Diagnosis not present

## 2016-10-07 NOTE — Progress Notes (Signed)
Impression and Recommendations:    1. Wound of left lower extremity, subsequent encounter   2. Solar keratosis    - wound care d/c daughter and pt.  Avoid continually keeping covered and moist with abx ointment.  Keep open to air when inside/clean environment and wearing shorts -  Avoid rubbing of material on wound- clothes, bed sheets etc - Signs of granulation tissue and normal wound healing discussed - notes from Derm r/w pt and pt's daughter.   Please see AVS handed out to patient at the end of our visit for further patient instructions/ counseling done pertaining to today's office visit.  Return if symptoms worsen or fail to improve.    Note: This document was prepared using Dragon voice recognition software and may include unintentional dictation errors.   --------------------------------------------------------------------------------------------------------------------------------------------------------------------------------------------------------------------------------------------    Subjective:    CC:  Chief Complaint  Patient presents with  . Wound Check    left lower leg - follow up     HPI: Brianna Thompson is a 83 y.o. female who presents to [place] at [facility] today for issues as discussed below.  Here for follow-up of her wound.  She was seen on 5\3\18 and seen by Derm for skin bx 09/19/16.   (( Last OV Plan of care:  - referral to Derm - for excision/ bx and definitive diag; esp since grew so quickly and pt with a hx of "skin CA"- not sure what diagnosis was - Keflex - avoid scratching--> use bandaid at night to avoid excoriation/ further tissue damage - if dev F/C/ ill feelings--> call us please ))   Currently:   Went to Derm (Dr. Jacquenette Shone PA) about 2 weeks ago--> bx was not cancer.  Told them it is just a "sun spot".  Not infected. They were not told to do anything in particular- per pt's daughter they were not given  any instructions.   Did not have a f/up appt.   No tracking of erythema, no edema.  No h/o MRSA.  NO F/C, no illness feelings, no N/V/D   Blood pressure at home runs in the 120s to 150 over 70s to 80s.    Wt Readings from Last 3 Encounters:  10/07/16 125 lb 4 oz (56.8 kg)  08/15/16 124 lb 12 oz (56.6 kg)  07/31/16 126 lb 9.6 oz (57.4 kg)   BP Readings from Last 3 Encounters:  10/07/16 (!) 152/84  08/15/16 (!) 142/78  07/31/16 (!) 189/85   Pulse Readings from Last 3 Encounters:  10/07/16 87  08/15/16 68  07/31/16 80   BMI Readings from Last 3 Encounters:  10/07/16 22.91 kg/m  08/15/16 22.82 kg/m  07/31/16 23.16 kg/m     Patient Care Team    Relationship Specialty Notifications Start End  Mellody Dance, DO PCP - General Family Medicine  01/31/16   Leandrew Koyanagi, MD Attending Physician Orthopedic Surgery  01/31/16   Hayden Pedro, MD Consulting Physician Ophthalmology  01/31/16   Calvert Cantor, MD Consulting Physician Optometry  03/18/16      Patient Active Problem List   Diagnosis Date Noted  . Blood glucose elevated (& elevated A1c) 04/02/2016    Priority: High  . HLD (hyperlipidemia) 03/18/2016    Priority: High  . HTN (hypertension) 02/17/2015    Priority: High  . Chronic anticoagulation 03/18/2016    Priority: Medium  . h/o Atrial fibrillation ([place]), CHA2DS2-VASc Score 5-6 02/17/2015    Priority: Medium  .  h/o Diastolic dysfunction with chronic heart failure ([place]) 02/17/2015    Priority: Medium  . Macular degeneration 03/18/2016    Priority: Low  . Hypothyroidism 02/17/2015    Priority: Low  . Glaucoma 02/17/2015    Priority: Low  . Solar keratosis 10/07/2016  . History of skin cancer 08/15/2016  . Wound of left leg 08/15/2016  . h/o Irregular heart rhythm 03/18/2016  . Encounter for screening for diseases of the blood and blood-forming organs and certain disorders involving the immune mechanism 03/18/2016  . Counseling on health promotion and  disease prevention 03/18/2016  . Hip fracture ([place]) 02/17/2015  . Closed left hip fracture ([place]) 02/17/2015  . Fall 02/17/2015  . H/O hypokalemia 02/17/2015  . Traumatic hematoma of left hand 02/17/2015  . Elevated CPK 02/17/2015    Past Medical history, Surgical history, Family history, Social history, Allergies and Medications have been entered into the medical record, reviewed and changed as needed.    Current Meds  Medication Sig  . acetaminophen (TYLENOL) 325 MG tablet Take 2 tablets (650 mg total) by mouth every 6 (six) hours as needed for mild pain, moderate pain, fever or headache.  . Besifloxacin HCl (BESIVANCE) 0.6 % SUSP Apply 1 drop to eye as directed.  . brimonidine (ALPHAGAN) 0.2 % ophthalmic solution Place 1 drop into both eyes 3 (three) times daily.  . Cholecalciferol (VITAMIN D3 SUPER STRENGTH) 2000 units CAPS Take 1 capsule by mouth daily.  Marland Kitchen docusate sodium (COLACE) 100 MG capsule Take 1 capsule (100 mg total) by mouth 2 (two) times daily.  . dorzolamide (TRUSOPT) 2 % ophthalmic solution Place 1 drop into both eyes daily.  . furosemide (LASIX) 40 MG tablet TAKE 1/2 TABLET BY MOUTH DAILY.  Marland Kitchen levothyroxine (SYNTHROID, LEVOTHROID) 50 MCG tablet TAKE 1 TABLET (50 MCG TOTAL) BY MOUTH DAILY BEFORE BREAKFAST.  Marland Kitchen lovastatin (MEVACOR) 20 MG tablet TAKE 1 TABLET (20 MG TOTAL) BY MOUTH DAILY AT 6 PM.  . metoprolol (LOPRESSOR) 100 MG tablet TAKE 1 TABLET BY MOUTH DAILY.  . Multiple Vitamins-Minerals (PRESERVISION AREDS 2) CAPS Take 1 capsule by mouth 2 (two) times daily.  . potassium chloride (K-DUR) 10 MEQ tablet Take 1 tablet (10 mEq total) by mouth daily. No more refills until follow up office visit.  Marland Kitchen timolol (BETIMOL) 0.5 % ophthalmic solution Place 1 drop into both eyes 2 (two) times daily.  Alveda Reasons 15 MG TABS tablet TAKE 1 TABLET BY MOUTH DAILY WITH SUPPER.    Allergies:  Allergies  Allergen Reactions  . Calcium-Containing Compounds Other (See Comments)     constipation     Review of Systems: General:   Denies fever, chills, unexplained weight loss.  Gastrointestinal:   Denies nausea, vomiting, diarrhea, abd pain.  Endocrine:     Denies hot or cold intolerance, polyuria, polydipsia. Musculoskeletal:   Denies unexplained myalgias, joint swelling, unexplained arthralgias, gait problems.  Skin:  Denies new onset rash, + skin lesion L L Ext   Objective:   Blood pressure (!) 152/84, pulse 87, height 5\' 2"  (1.575 m), weight 125 lb 4 oz (56.8 kg). Body mass index is 22.91 kg/m. General:  Well Developed, well nourished, appropriate for stated age.  Neuro:  Alert and oriented,  extra-ocular muscles intact  HEENT:  Normocephalic, atraumatic, neck supple Skin:  1.75 cm* 2.5 cm lesion L anterior shin. Pink granulation tissue, no exudates, no inc wamth or tracking erythema, No signs infxn Vascular:  Ext warm, no cyanosis apprec; cap RF less 2 sec,  peripheral non-pit edema b/l lext, hyperpigmented slightly b/l . Psych:  No HI/SI, judgement and insight good, Euthymic mood. Full Affect.

## 2016-10-07 NOTE — Patient Instructions (Signed)
     Delayed Wound Closure Sometimes, your health care provider will decide to delay closing a wound for several days. This is done when the wound is badly bruised, dirty, or when it has been several hours since the injury happened. By delaying the closure of your wound, the risk of infection is reduced. Wounds that are closed in 3-7 days after being cleaned up and dressed heal just as well as those that are closed right away. Follow these instructions at home:  Rest and elevate the injured area until the pain and swelling are gone.  Have your wound checked as instructed by your health care provider. Contact a health care provider if:  You develop unusual or increased swelling or redness around the wound.  You have increasing pain or tenderness.  There is increasing fluid (drainage) or a bad smelling drainage coming from the wound. This information is not intended to replace advice given to you by your health care provider. Make sure you discuss any questions you have with your health care provider. Document Released: 04/01/2005 Document Revised: 09/07/2015 Document Reviewed: 09/29/2012 Elsevier Interactive Patient Education  2017 [employer].

## 2016-10-15 ENCOUNTER — Encounter (INDEPENDENT_AMBULATORY_CARE_PROVIDER_SITE_OTHER): Payer: Medicare Other | Admitting: Ophthalmology

## 2016-10-15 DIAGNOSIS — H35033 Hypertensive retinopathy, bilateral: Secondary | ICD-10-CM | POA: Diagnosis not present

## 2016-10-15 DIAGNOSIS — H353132 Nonexudative age-related macular degeneration, bilateral, intermediate dry stage: Secondary | ICD-10-CM

## 2016-10-15 DIAGNOSIS — I10 Essential (primary) hypertension: Secondary | ICD-10-CM

## 2016-10-15 DIAGNOSIS — H43813 Vitreous degeneration, bilateral: Secondary | ICD-10-CM

## 2016-10-15 DIAGNOSIS — H34832 Tributary (branch) retinal vein occlusion, left eye, with macular edema: Secondary | ICD-10-CM | POA: Diagnosis not present

## 2016-10-17 ENCOUNTER — Other Ambulatory Visit: Payer: Self-pay | Admitting: Adult Health

## 2016-10-19 ENCOUNTER — Other Ambulatory Visit: Payer: Self-pay | Admitting: Family Medicine

## 2016-11-04 ENCOUNTER — Ambulatory Visit: Payer: Medicare Other | Admitting: Family Medicine

## 2016-11-06 ENCOUNTER — Ambulatory Visit (INDEPENDENT_AMBULATORY_CARE_PROVIDER_SITE_OTHER): Payer: Medicare Other | Admitting: Family Medicine

## 2016-11-06 ENCOUNTER — Encounter: Payer: Self-pay | Admitting: Family Medicine

## 2016-11-06 VITALS — BP 176/93 | HR 85 | Ht 62.0 in | Wt 123.0 lb

## 2016-11-06 DIAGNOSIS — I499 Cardiac arrhythmia, unspecified: Secondary | ICD-10-CM

## 2016-11-06 DIAGNOSIS — I482 Chronic atrial fibrillation, unspecified: Secondary | ICD-10-CM

## 2016-11-06 DIAGNOSIS — E876 Hypokalemia: Secondary | ICD-10-CM

## 2016-11-06 DIAGNOSIS — I10 Essential (primary) hypertension: Secondary | ICD-10-CM

## 2016-11-06 DIAGNOSIS — I5032 Chronic diastolic (congestive) heart failure: Secondary | ICD-10-CM

## 2016-11-06 DIAGNOSIS — E039 Hypothyroidism, unspecified: Secondary | ICD-10-CM

## 2016-11-06 DIAGNOSIS — Z85828 Personal history of other malignant neoplasm of skin: Secondary | ICD-10-CM

## 2016-11-06 DIAGNOSIS — E785 Hyperlipidemia, unspecified: Secondary | ICD-10-CM

## 2016-11-06 DIAGNOSIS — S81802D Unspecified open wound, left lower leg, subsequent encounter: Secondary | ICD-10-CM

## 2016-11-06 DIAGNOSIS — R739 Hyperglycemia, unspecified: Secondary | ICD-10-CM

## 2016-11-06 DIAGNOSIS — Z862 Personal history of diseases of the blood and blood-forming organs and certain disorders involving the immune mechanism: Secondary | ICD-10-CM

## 2016-11-06 DIAGNOSIS — L858 Other specified epidermal thickening: Secondary | ICD-10-CM

## 2016-11-06 MED ORDER — FUROSEMIDE 20 MG PO TABS: 20.0000 mg | ORAL_TABLET | Freq: Every day | ORAL | 1 refills | Status: DC

## 2016-11-06 NOTE — Progress Notes (Signed)
Impression and Recommendations:    1. Elevated blood pressure reading in office with white coat syndrome, with diagnosis of hypertension   2. 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 h/o Diastolic dysfunction with chronic heart failure ([place])   4. Chronic atrial fibrillation (HCC)   5. Hyperlipidemia, unspecified hyperlipidemia type   6. Hypertension, unspecified type   7. Blood glucose elevated (& elevated A1c)   8. Hypothyroidism, unspecified type   9. H/O hypokalemia   10. h/o Irregular heart rhythm   11. History of skin cancer   12. Wound of left lower extremity, subsequent encounter   13. H/O iron deficiency anemia    - Continue to monitor blood pressures at home as they're doing every couple of days. - Follow-up appointment with dermatology - Advised to elevate feet whenever she is sitting and watching TV-preferably above the level of her heart. - Advised to try to get up and walk for 5 minutes every hour to help with any future mobility issues. - We'll check CBC, BMP for potassium and A1c.  All the other recent labs that were done December 2014 were normal and we will repeat at 1 year.   Please see AVS handed out to patient at the end of our visit for further patient instructions/ counseling done pertaining to today's office visit.  Return in about 4 months (around 03/09/2017) for For follow-up chronic medical problems, will need Medicare wellness yearly exam  in addition   .    Note: This document was prepared using Dragon voice recognition software and may include unintentional dictation errors.   --------------------------------------------------------------------------------------------------------------------------------------------------------------------------------------------------------------------------------------------    Subjective:    CC:  Chief Complaint  Patient presents with  . Follow-up    HPI: Brianna Thompson is  a 83 y.o. female who presents to [place] at [facility] today for issues as discussed below. Regular   Patient's left lower leg is still not healing well and looks very hyperkeratotic.  Occasionally patient has a shooting pain from that lesion down her leg.  Patient did not follow up with dermatologist as I recommended prior, as patient wished just to come back to see me.   She will resume skin growth on her right wrist.  She has no complaints of eating or bowel complaints or urinary symptoms.  She denies any chest pain, shortness of breath, irregular heartbeats, shortness of breath, dyspnea on exertion, orthopnea or mobility issues.  She feels safe at home.  She is using her one cane walker to get around.  Most of the day she sits and watches TV for 6-8 hours and does not elevate her legs.  She has some minor swelling to her lower extremities otherwise has no complaints today.  -    Patient has white coat syndrome:  Home blood pressures are 106-129 over 70s continuously. DIL here with her today tells me they check it regularly at least 3-4 days per week.     ------------------------------------------------------ Here for follow-up of her wound.  She was seen on 5\3\18 and seen by Derm for skin bx 09/19/16.   (( Last OV Plan of care:  - referral to Derm - for excision/ bx and definitive diag; esp since grew so quickly and pt with a hx of "skin CA"- not sure what diagnosis was - Keflex - avoid scratching--> use bandaid at night to avoid excoriation/ further tissue damage - if dev F/C/ ill feelings-->  call us please ))   Currently:   Went to Derm (Dr. Jacquenette Shone PA) about 2 weeks ago--> bx was not cancer.  Told them it is just a "sun spot".  Not infected. They were not told to do anything in particular- per pt's daughter they were not given any instructions.   Did not have a f/up appt.   No tracking of erythema, no edema.  No h/o MRSA.  NO F/C, no illness feelings, no N/V/D    Blood pressure at home runs in the 120s to 150 over 70s to 80s.    Wt Readings from Last 3 Encounters:  11/06/16 123 lb (55.8 kg)  10/07/16 125 lb 4 oz (56.8 kg)  08/15/16 124 lb 12 oz (56.6 kg)   BP Readings from Last 3 Encounters:  11/06/16 (!) 178/84  10/07/16 (!) 152/84  08/15/16 (!) 142/78   Pulse Readings from Last 3 Encounters:  11/06/16 92  10/07/16 87  08/15/16 68   BMI Readings from Last 3 Encounters:  11/06/16 22.50 kg/m  10/07/16 22.91 kg/m  08/15/16 22.82 kg/m     Patient Care Team    Relationship Specialty Notifications Start End  Mellody Dance, DO PCP - General Family Medicine  01/31/16   Leandrew Koyanagi, MD Attending Physician Orthopedic Surgery  01/31/16   Hayden Pedro, MD Consulting Physician Ophthalmology  01/31/16   Calvert Cantor, MD Consulting Physician Optometry  03/18/16      Patient Active Problem List   Diagnosis Date Noted  . Blood glucose elevated (& elevated A1c) 04/02/2016    Priority: High  . HLD (hyperlipidemia) 03/18/2016    Priority: High  . HTN (hypertension) 02/17/2015    Priority: High  . Chronic anticoagulation 03/18/2016    Priority: Medium  . h/o Atrial fibrillation ([place]), CHA2DS2-VASc Score 5-6 02/17/2015    Priority: Medium  . h/o Diastolic dysfunction with chronic heart failure ([place]) 02/17/2015    Priority: Medium  . Macular degeneration 03/18/2016    Priority: Low  . Hypothyroidism 02/17/2015    Priority: Low  . Glaucoma 02/17/2015    Priority: Low  . 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 Solar keratosis 10/07/2016  . History of skin cancer 08/15/2016  . Wound of left leg 08/15/2016  . h/o Irregular heart rhythm 03/18/2016  . Encounter for screening for diseases of the blood and blood-forming organs and certain disorders involving the immune mechanism 03/18/2016  . Counseling on health promotion and disease prevention 03/18/2016  . Hip fracture ([place]) 02/17/2015  .  Closed left hip fracture ([place]) 02/17/2015  . Fall 02/17/2015  . H/O hypokalemia 02/17/2015  . Traumatic hematoma of left hand 02/17/2015  . Elevated CPK 02/17/2015    Past Medical history, Surgical history, Family history, Social history, Allergies and Medications have been entered into the medical record, reviewed and changed as needed.    Current Meds  Medication Sig  . acetaminophen (TYLENOL) 325 MG tablet Take 2 tablets (650 mg total) by mouth every 6 (six) hours as needed for mild pain, moderate pain, fever or headache.  . Besifloxacin HCl (BESIVANCE) 0.6 % SUSP Apply 1 drop to eye as directed.  . brimonidine (ALPHAGAN) 0.2 % ophthalmic solution Place 1 drop into both eyes 3 (three) times daily.  . Cholecalciferol (VITAMIN D3 SUPER STRENGTH) 2000 units CAPS Take 1 capsule by mouth daily.  Marland Kitchen docusate sodium (COLACE) 100 MG capsule Take 1 capsule (100 mg total) by mouth  2 (two) times daily.  . dorzolamide (TRUSOPT) 2 % ophthalmic solution Place 1 drop into both eyes daily.  Marland Kitchen levothyroxine (SYNTHROID, LEVOTHROID) 50 MCG tablet TAKE 1 TABLET (50 MCG TOTAL) BY MOUTH DAILY BEFORE BREAKFAST.  Marland Kitchen lovastatin (MEVACOR) 20 MG tablet TAKE 1 TABLET (20 MG TOTAL) BY MOUTH DAILY AT 6 PM.  . metoprolol (LOPRESSOR) 100 MG tablet TAKE 1 TABLET BY MOUTH DAILY.  . Multiple Vitamins-Minerals (PRESERVISION AREDS 2) CAPS Take 1 capsule by mouth 2 (two) times daily.  . potassium chloride (K-DUR) 10 MEQ tablet Take 1 tablet (10 mEq total) by mouth daily.  . timolol (BETIMOL) 0.5 % ophthalmic solution Place 1 drop into both eyes 2 (two) times daily.  . timolol (TIMOPTIC) 0.5 % ophthalmic solution   . XARELTO 15 MG TABS tablet TAKE 1 TABLET BY MOUTH DAILY WITH SUPPER.  . [DISCONTINUED] furosemide (LASIX) 40 MG tablet TAKE 1/2 TABLET BY MOUTH DAILY.    Allergies:  Allergies  Allergen Reactions  . Calcium-Containing Compounds Other (See Comments)    constipation     Review of Systems: General:   Denies  fever, chills, unexplained weight loss.  Gastrointestinal:   Denies nausea, vomiting, diarrhea, abd pain.  Endocrine:     Denies hot or cold intolerance, polyuria, polydipsia. Musculoskeletal:   Denies unexplained myalgias, joint swelling, unexplained arthralgias, gait problems.  Skin:  Denies new onset rash, + skin lesion L L Ext   Objective:   Blood pressure (!) 178/84, pulse 92, height 5\' 2"  (1.575 m), weight 123 lb (55.8 kg). Body mass index is 22.5 kg/m. General:  Well Developed, well nourished, appropriate for stated age.  Neuro:  Alert and oriented,  extra-ocular muscles intact  HEENT:  Normocephalic, atraumatic, neck supple Skin:  1.75 cm* 2.5 cm lesion L anterior shin. Pink granulation tissue, no exudates, no inc wamth or tracking erythema, No signs infxn Vascular:  Ext warm, no cyanosis apprec; cap RF less 2 sec, peripheral non-pit edema b/l lext, hyperpigmented slightly b/l . Psych:  No HI/SI, judgement and insight good, Euthymic mood. Full Affect.

## 2016-11-06 NOTE — Patient Instructions (Signed)
How to Use a Walker How to walk with a walker The best way to walk with a walker depends on whether you are using a standard walker or a front-wheeled walker. A standard walker has rubber tips on the ends of all four legs. A front-wheeled walker has wheels on the ends of the front legs and rubber tips on the ends of the back legs.  Do not use your walker on stairs or an escalator unless you have been trained by a physical therapist or unless your health care provider approves.  To Walk With a Standard Walker:  1. Pick up your walker. Do not slide your standard walker. 2. Set down your walker, one step-length in front of you. Make sure that all four legs of the walker touch the ground at the same time. Your toes should be farther forward than the back legs of your walker. 3. Hold on to the walker for support, and step your weaker leg into the middle of the walker. 4. Step your stronger leg forward to land next to your weaker leg. 5. Repeat this process for each step. To Walk With a Front-Wheeled Walker: 1. Slide your front-wheeled walker one step-length in front of you. Your toes should be farther forward than the back legs of your walker. 2. Hold on to the walker for support, and step your weaker leg into the middle of the walker. 3. Step your stronger leg forward to land next to your weaker leg. 4. Repeat the process for each step. Tips  Always keep both feet within the width of the walker's legs or wheels.  When using your walker, you should not feel like you need to lean forward or to the side to keep your hands on the handgrips.  Make sure you are following any weight-bearing instructions that your health care provider has given you.  If you have a standard walker: ? Do not slide your walker when you are moving.  If you have a front-wheeled walker: ? Be careful not to let the walker get too far ahead of you as you walk. ? If your walker does not glide well over carpet, consider  cutting an "X" into two tennis balls and placing the balls over the back legs of your walker. How to stand up with a walker 1. Put your walker in front of you. 2. Slide forward in your chair. 3. Position your legs so that your weaker leg is ahead of you and your stronger leg is bent and near your chair. 4. Position your hands. ? If your chair has armrests, put each hand on an armrest. ? If there are no armrests, put the hand opposite your weaker leg on the chair seat, and put the other hand on the center of the walker's crossbar. 5. Lean forward and push up from your chair. 6. Rise by straightening your stronger leg. 7. Steady yourself. 8. Carefully move your hands to the handgrips of the walker. Tips  Do not pull on the walker when you stand up. This may cause it to tip.  Sit in a firm chair whenever you can. A low seat or an overstuffed chair or sofa is hard to get out of. How to sit down with a walker To Sit Down in a Seat That Has Armrests:  1. Back up toward your seat, using your walker, until you feel the back of your legs touch the chair. 2. Carefully reach your hands behind you and put each hand on  an armrest. 3. Slowly lower yourself into the seat. To Sit Down in a Seat Without Armrests: 1. Back up toward the side of the seat, using your walker, until you feel the back of your legs touch the chair. 2. Use one hand to hold on to the back of the chair, and use the other hand to hold on to the front of the seat. 3. Slowly lower yourself into the seat. How to use a walker on a curb or step To Use a Walker to Step Up: 1. Put all four legs of the walker on the curb or step. 2. Get your feet as close to the curb or step as you can. 3. Test the steadiness of the walker by pressing down on the handgrips. 4. If the walker is steady, press down on it with your hands as you step up with your stronger leg. 5. Step up with your weaker leg. To Use a Walker to Step Down : 1. Put all four  legs of the walker on the surface that is lower than the curb or step. 2. Get your feet as close to the curb or step as you can. 3. Test the steadiness of the walker by pressing down on the handgrips. 4. If the walker is steady, press down on it with your hands as you step down with your weaker leg. 5. Step down with your stronger leg. This information is not intended to replace advice given to you by your health care provider. Make sure you discuss any questions you have with your health care provider. Document Released: 04/01/2005 Document Revised: 08/30/2015 Document Reviewed: 10/14/2014 Elsevier Interactive Patient Education  2017 [place]. Dehydration Dehydration is a condition in which there is not enough fluid or water in the body. This happens when you lose more fluids than you take in. Important organs, such as the kidneys, brain, and heart, cannot function without a proper amount of fluids. Any loss of fluids from the body can lead to dehydration. People age 69 or older have a higher risk of dehydration than younger adults because in older age, the body:  Is less able to conserve water.  Does not respond to temperature changes as well.  Does not get thirsty as easily or quickly.  Dehydration can range from mild to severe. This condition should be treated right away to prevent it from becoming severe. What are the causes? Dehydration may be caused by:  Vomiting.  Diarrhea.  Excessive sweating, such as from heat exposure or exercise.  Not drinking enough fluid, especially: ? When ill. ? While doing activity that requires a lot of energy.  Excessive urination.  Fever.  Infection.  Certain medicines, such as medicines that cause the body to lose excess fluid (diuretics).  Inability to access safe drinking water.  Poorly controlled blood sugars.  Reduced physical ability to get adequate water and food.  What increases the risk? This condition is more likely to  develop in people who:  Have a long-term (chronic) illness, such as: ? An illness that may increase urination, such as diabetes. ? Kidney, heart, or lung disease. ? Neurological or psychological disorders, such as dementia.  Are age 32 or older.  Are disabled.  Live in a place with high altitude.  What are the signs or symptoms? Symptoms of mild dehydration may include:  Thirst.  Dry lips.  Slightly dry mouth.  Dry, warm skin.  Dizziness. Symptoms of moderate dehydration may include:  Very dry mouth.  Muscle cramps.  Dark urine. Urine may be the color of tea.  Decreased urine production.  Decreased tear production.  Heartbeat that is irregular or faster than normal (palpitations).  Headache.  Light-headedness, especially when you stand up from a sitting position.  Fainting (syncope). Symptoms of severe dehydration may include:  Changes in skin, such as: ? Cold and clammy skin. ? Blotchy (mottled) or pale skin. ? Skin that does not quickly return to normal after being lightly pinched and released (poor skin turgor).  Changes in body fluids, such as: ? Extreme thirst. ? No tear production. ? Inability to sweat when body temperature is high, such as in hot weather. ? Very little urine production.  Changes in vital signs, such as: ? Weak pulse. ? Pulse that is more than 100 beats a minute when sitting still. ? Rapid breathing. ? Low blood pressure.  Other changes, such as: ? Sunken eyes. ? Cold hands and feet. ? Confusion. ? Lack of energy (lethargy). ? Difficulty waking up from sleep. ? Short-term weight loss. ? Unconsciousness. How is this diagnosed? This condition is diagnosed based on your symptoms and a physical exam. Blood and urine tests may be done to help confirm the diagnosis. How is this treated? Treatment for this condition depends on the severity. Mild or moderate dehydration can often be treated at home. Treatment should be started  right away. Do not wait until dehydration becomes severe. Severe dehydration is an emergency and it needs to be treated in a hospital. Treatment for mild dehydration may include:  Drinking more fluids.  Replacing salts and minerals in your blood (electrolytes). Treatment for moderate dehydration may include:  Drinking an oral rehydration solution (ORS). This is a drink that helps you replace fluids and electrolytes (rehydrate). It is found at pharmacies and retail stores. Treatment for severe dehydration may include:  Receiving fluids through an IV tube.  Receiving an electrolyte solution through a tube passed through your nose and into your stomach (nasogastric tube or NG tube).  Correcting abnormalities in electrolytes.  Treating the underlying cause of dehydration. Follow these instructions at home:  If directed by your health care provider, drink an ORS: ? Make an ORS by following instructions on the package. ? Start by drinking small amounts, about  cup (120 mL) every 5-10 minutes. ? Slowly increase how much you drink until you have taken the amount recommended by your health care provider.  Drink enough clear fluid to keep your urine clear or pale yellow. If you were told to drink an ORS, finish the ORS first, then start slowly drinking other clear fluids. Drink fluids such as: ? Water. Do not drink only water. Doing that can lead to having too little salt (sodium) in the body (hyponatremia). ? Ice chips. ? Fruit juice that you have added water to (diluted fruit juice). ? Low-calorie sports drinks.  Avoid: ? Alcohol. ? Drinks that contain a lot of sugar. These include high-calorie sports drinks, fruit juice that is not diluted, and soda. ? Caffeine. ? Foods that are greasy or contain a lot of fat or sugar.  Take over-the-counter and prescription medicines only as told by your health care provider.  Do not take sodium tablets. This can lead to having too much sodium in  the body (hypernatremia).  Eat foods that contain a healthy balance of electrolytes, such as bananas, oranges, potatoes, tomatoes, and spinach.  Keep all follow-up visits as told by your health care provider. This is important. Contact  a health care provider if:  You have abdominal pain that: ? Gets worse. ? Stays in one area (localizes).  You have a rash.  You have a stiff neck.  You are sleepier, more irritable, or more difficult to wake up than usual.  You feel weak, dizzy, or very thirsty. Get help right away if:  You have: ? Symptoms of severe dehydration. ? A fever. ? A severe headache. ? Vomiting or diarrhea that gets worse or does not go away. ? Diarrhea for more than 24 hours. ? Blood or green matter (bile) in your vomit. ? Blood in your stool. This may cause stool to look black and tarry. ? Trouble breathing.  You cannot drink fluids without vomiting.  Your symptoms get worse with treatment.  You have not urinated in 6-8 hours.  You have urinated only a small amount of very dark urine over 6-8 hours.  You faint.  Your heart rate while sitting still is over 100 beats a minute. This information is not intended to replace advice given to you by your health care provider. Make sure you discuss any questions you have with your health care provider. Document Released: 06/22/2003 Document Revised: 10/20/2015 Document Reviewed: 05/26/2015 Elsevier Interactive Patient Education  2017 [employer].

## 2016-11-07 ENCOUNTER — Telehealth: Payer: Self-pay | Admitting: Family Medicine

## 2016-11-07 ENCOUNTER — Encounter (INDEPENDENT_AMBULATORY_CARE_PROVIDER_SITE_OTHER): Payer: Medicare Other | Admitting: Ophthalmology

## 2016-11-07 DIAGNOSIS — H35033 Hypertensive retinopathy, bilateral: Secondary | ICD-10-CM

## 2016-11-07 DIAGNOSIS — H348112 Central retinal vein occlusion, right eye, stable: Secondary | ICD-10-CM

## 2016-11-07 DIAGNOSIS — H34832 Tributary (branch) retinal vein occlusion, left eye, with macular edema: Secondary | ICD-10-CM | POA: Diagnosis not present

## 2016-11-07 DIAGNOSIS — H353132 Nonexudative age-related macular degeneration, bilateral, intermediate dry stage: Secondary | ICD-10-CM

## 2016-11-07 DIAGNOSIS — I10 Essential (primary) hypertension: Secondary | ICD-10-CM

## 2016-11-07 DIAGNOSIS — H43813 Vitreous degeneration, bilateral: Secondary | ICD-10-CM | POA: Diagnosis not present

## 2016-11-07 DIAGNOSIS — L858 Other specified epidermal thickening: Secondary | ICD-10-CM

## 2016-11-07 LAB — CBC WITH DIFFERENTIAL/PLATELET
BASOS ABS: 0.1 10*3/uL (ref 0.0–0.2)
BASOS: 1 %
EOS (ABSOLUTE): 0.1 10*3/uL (ref 0.0–0.4)
Eos: 2 %
Hematocrit: 39.5 % (ref 34.0–46.6)
Hemoglobin: 13.8 g/dL (ref 11.1–15.9)
Immature Grans (Abs): 0 10*3/uL (ref 0.0–0.1)
Immature Granulocytes: 0 %
LYMPHS ABS: 1.6 10*3/uL (ref 0.7–3.1)
LYMPHS: 27 %
MCH: 35 pg — ABNORMAL HIGH (ref 26.6–33.0)
MCHC: 34.9 g/dL (ref 31.5–35.7)
MCV: 100 fL — AB (ref 79–97)
MONOS ABS: 0.6 10*3/uL (ref 0.1–0.9)
Monocytes: 10 %
NEUTROS ABS: 3.5 10*3/uL (ref 1.4–7.0)
Neutrophils: 60 %
PLATELETS: 285 10*3/uL (ref 150–379)
RBC: 3.94 x10E6/uL (ref 3.77–5.28)
RDW: 15 % (ref 12.3–15.4)
WBC: 5.8 10*3/uL (ref 3.4–10.8)

## 2016-11-07 LAB — BASIC METABOLIC PANEL
BUN / CREAT RATIO: 22 (ref 12–28)
BUN: 22 mg/dL (ref 10–36)
CHLORIDE: 102 mmol/L (ref 96–106)
CO2: 26 mmol/L (ref 20–29)
Calcium: 10.6 mg/dL — ABNORMAL HIGH (ref 8.7–10.3)
Creatinine, Ser: 1 mg/dL (ref 0.57–1.00)
GFR calc Af Amer: 57 mL/min/{1.73_m2} — ABNORMAL LOW (ref >59)
GFR calc non Af Amer: 49 mL/min/{1.73_m2} — ABNORMAL LOW (ref >59)
GLUCOSE: 103 mg/dL — AB (ref 65–99)
POTASSIUM: 4.2 mmol/L (ref 3.5–5.2)
SODIUM: 143 mmol/L (ref 134–144)

## 2016-11-07 LAB — HEMOGLOBIN A1C
ESTIMATED AVERAGE GLUCOSE: 140 mg/dL
HEMOGLOBIN A1C: 6.5 % — AB (ref 4.8–5.6)

## 2016-11-07 NOTE — Telephone Encounter (Signed)
New referral placed for [facility] dermatology per patient request.

## 2016-11-07 NOTE — Telephone Encounter (Signed)
Patient called est dermatology office, they have had provider turn over and cant get in until March 2019. They are requesting a referral to [facility].

## 2016-11-07 NOTE — Addendum Note (Signed)
Addended by: Amado Coe on: 11/07/2016 09:38 AM   Modules accepted: Orders

## 2016-11-11 ENCOUNTER — Other Ambulatory Visit: Payer: Self-pay | Admitting: Adult Health

## 2016-11-19 DIAGNOSIS — C44612 Basal cell carcinoma of skin of right upper limb, including shoulder: Secondary | ICD-10-CM | POA: Diagnosis not present

## 2016-11-19 DIAGNOSIS — L858 Other specified epidermal thickening: Secondary | ICD-10-CM | POA: Diagnosis not present

## 2016-11-19 DIAGNOSIS — C44729 Squamous cell carcinoma of skin of left lower limb, including hip: Secondary | ICD-10-CM | POA: Diagnosis not present

## 2016-12-01 ENCOUNTER — Encounter: Payer: Self-pay | Admitting: Family Medicine

## 2016-12-01 DIAGNOSIS — C4491 Basal cell carcinoma of skin, unspecified: Secondary | ICD-10-CM | POA: Insufficient documentation

## 2016-12-05 ENCOUNTER — Encounter (INDEPENDENT_AMBULATORY_CARE_PROVIDER_SITE_OTHER): Payer: Medicare Other | Admitting: Ophthalmology

## 2016-12-05 DIAGNOSIS — H35033 Hypertensive retinopathy, bilateral: Secondary | ICD-10-CM

## 2016-12-05 DIAGNOSIS — I10 Essential (primary) hypertension: Secondary | ICD-10-CM

## 2016-12-05 DIAGNOSIS — H353211 Exudative age-related macular degeneration, right eye, with active choroidal neovascularization: Secondary | ICD-10-CM | POA: Diagnosis not present

## 2016-12-05 DIAGNOSIS — H43813 Vitreous degeneration, bilateral: Secondary | ICD-10-CM

## 2016-12-05 DIAGNOSIS — H353122 Nonexudative age-related macular degeneration, left eye, intermediate dry stage: Secondary | ICD-10-CM | POA: Diagnosis not present

## 2016-12-05 DIAGNOSIS — H34832 Tributary (branch) retinal vein occlusion, left eye, with macular edema: Secondary | ICD-10-CM | POA: Diagnosis not present

## 2016-12-14 ENCOUNTER — Other Ambulatory Visit: Payer: Self-pay | Admitting: Adult Health

## 2016-12-30 ENCOUNTER — Other Ambulatory Visit: Payer: Self-pay | Admitting: Family Medicine

## 2017-01-01 ENCOUNTER — Encounter (INDEPENDENT_AMBULATORY_CARE_PROVIDER_SITE_OTHER): Payer: Medicare Other | Admitting: Ophthalmology

## 2017-01-01 DIAGNOSIS — H353122 Nonexudative age-related macular degeneration, left eye, intermediate dry stage: Secondary | ICD-10-CM

## 2017-01-01 DIAGNOSIS — H34832 Tributary (branch) retinal vein occlusion, left eye, with macular edema: Secondary | ICD-10-CM | POA: Diagnosis not present

## 2017-01-01 DIAGNOSIS — H353211 Exudative age-related macular degeneration, right eye, with active choroidal neovascularization: Secondary | ICD-10-CM

## 2017-01-01 DIAGNOSIS — H35033 Hypertensive retinopathy, bilateral: Secondary | ICD-10-CM

## 2017-01-01 DIAGNOSIS — H43813 Vitreous degeneration, bilateral: Secondary | ICD-10-CM | POA: Diagnosis not present

## 2017-01-01 DIAGNOSIS — I10 Essential (primary) hypertension: Secondary | ICD-10-CM | POA: Diagnosis not present

## 2017-01-14 ENCOUNTER — Other Ambulatory Visit: Payer: Self-pay | Admitting: Adult Health

## 2017-01-17 DIAGNOSIS — Z85828 Personal history of other malignant neoplasm of skin: Secondary | ICD-10-CM | POA: Diagnosis not present

## 2017-01-17 DIAGNOSIS — C44612 Basal cell carcinoma of skin of right upper limb, including shoulder: Secondary | ICD-10-CM | POA: Diagnosis not present

## 2017-01-20 ENCOUNTER — Ambulatory Visit (INDEPENDENT_AMBULATORY_CARE_PROVIDER_SITE_OTHER): Payer: Medicare Other

## 2017-01-20 VITALS — Temp 97.7°F

## 2017-01-20 DIAGNOSIS — Z23 Encounter for immunization: Secondary | ICD-10-CM

## 2017-01-29 ENCOUNTER — Encounter (INDEPENDENT_AMBULATORY_CARE_PROVIDER_SITE_OTHER): Payer: Medicare Other | Admitting: Ophthalmology

## 2017-01-29 DIAGNOSIS — H43813 Vitreous degeneration, bilateral: Secondary | ICD-10-CM | POA: Diagnosis not present

## 2017-01-29 DIAGNOSIS — H34832 Tributary (branch) retinal vein occlusion, left eye, with macular edema: Secondary | ICD-10-CM | POA: Diagnosis not present

## 2017-01-29 DIAGNOSIS — I10 Essential (primary) hypertension: Secondary | ICD-10-CM | POA: Diagnosis not present

## 2017-01-29 DIAGNOSIS — H353211 Exudative age-related macular degeneration, right eye, with active choroidal neovascularization: Secondary | ICD-10-CM

## 2017-01-29 DIAGNOSIS — H35033 Hypertensive retinopathy, bilateral: Secondary | ICD-10-CM

## 2017-01-29 DIAGNOSIS — H353122 Nonexudative age-related macular degeneration, left eye, intermediate dry stage: Secondary | ICD-10-CM | POA: Diagnosis not present

## 2017-02-04 ENCOUNTER — Other Ambulatory Visit: Payer: Self-pay | Admitting: Family Medicine

## 2017-02-26 DIAGNOSIS — H34832 Tributary (branch) retinal vein occlusion, left eye, with macular edema: Secondary | ICD-10-CM | POA: Diagnosis not present

## 2017-02-26 DIAGNOSIS — Z961 Presence of intraocular lens: Secondary | ICD-10-CM | POA: Diagnosis not present

## 2017-02-26 DIAGNOSIS — H353212 Exudative age-related macular degeneration, right eye, with inactive choroidal neovascularization: Secondary | ICD-10-CM | POA: Diagnosis not present

## 2017-02-26 DIAGNOSIS — H401133 Primary open-angle glaucoma, bilateral, severe stage: Secondary | ICD-10-CM | POA: Diagnosis not present

## 2017-03-05 ENCOUNTER — Encounter (INDEPENDENT_AMBULATORY_CARE_PROVIDER_SITE_OTHER): Payer: Medicare Other | Admitting: Ophthalmology

## 2017-03-05 DIAGNOSIS — I10 Essential (primary) hypertension: Secondary | ICD-10-CM

## 2017-03-05 DIAGNOSIS — H353211 Exudative age-related macular degeneration, right eye, with active choroidal neovascularization: Secondary | ICD-10-CM | POA: Diagnosis not present

## 2017-03-05 DIAGNOSIS — H43813 Vitreous degeneration, bilateral: Secondary | ICD-10-CM | POA: Diagnosis not present

## 2017-03-05 DIAGNOSIS — H34832 Tributary (branch) retinal vein occlusion, left eye, with macular edema: Secondary | ICD-10-CM

## 2017-03-05 DIAGNOSIS — H353122 Nonexudative age-related macular degeneration, left eye, intermediate dry stage: Secondary | ICD-10-CM | POA: Diagnosis not present

## 2017-03-05 DIAGNOSIS — H35033 Hypertensive retinopathy, bilateral: Secondary | ICD-10-CM

## 2017-03-10 ENCOUNTER — Encounter: Payer: Self-pay | Admitting: Family Medicine

## 2017-03-10 ENCOUNTER — Ambulatory Visit: Payer: Medicare Other | Admitting: Family Medicine

## 2017-03-10 VITALS — BP 154/75 | HR 63 | Ht 62.0 in | Wt 117.0 lb

## 2017-03-10 DIAGNOSIS — I482 Chronic atrial fibrillation, unspecified: Secondary | ICD-10-CM

## 2017-03-10 DIAGNOSIS — I5032 Chronic diastolic (congestive) heart failure: Secondary | ICD-10-CM

## 2017-03-10 DIAGNOSIS — R739 Hyperglycemia, unspecified: Secondary | ICD-10-CM

## 2017-03-10 DIAGNOSIS — E876 Hypokalemia: Secondary | ICD-10-CM

## 2017-03-10 DIAGNOSIS — Z7901 Long term (current) use of anticoagulants: Secondary | ICD-10-CM | POA: Diagnosis not present

## 2017-03-10 DIAGNOSIS — I10 Essential (primary) hypertension: Secondary | ICD-10-CM

## 2017-03-10 DIAGNOSIS — I499 Cardiac arrhythmia, unspecified: Secondary | ICD-10-CM

## 2017-03-10 DIAGNOSIS — E785 Hyperlipidemia, unspecified: Secondary | ICD-10-CM

## 2017-03-10 DIAGNOSIS — Z8739 Personal history of other diseases of the musculoskeletal system and connective tissue: Secondary | ICD-10-CM | POA: Diagnosis not present

## 2017-03-10 DIAGNOSIS — R350 Frequency of micturition: Secondary | ICD-10-CM | POA: Diagnosis not present

## 2017-03-10 DIAGNOSIS — E039 Hypothyroidism, unspecified: Secondary | ICD-10-CM

## 2017-03-10 LAB — POCT URINALYSIS DIPSTICK
Bilirubin, UA: NEGATIVE
GLUCOSE UA: NEGATIVE
Ketones, UA: NEGATIVE
Leukocytes, UA: NEGATIVE
NITRITE UA: NEGATIVE
PH UA: 5.5 (ref 5.0–8.0)
Protein, UA: NEGATIVE
Spec Grav, UA: 1.01 (ref 1.010–1.025)
UROBILINOGEN UA: 0.2 U/dL

## 2017-03-10 LAB — POCT GLYCOSYLATED HEMOGLOBIN (HGB A1C): HEMOGLOBIN A1C: 6.7

## 2017-03-10 NOTE — Patient Instructions (Addendum)
Near future follow-up for fasting blood work, also for your pneumonia vaccine.  We will see you in 4 months for follow-up of blood pressure etc.

## 2017-03-10 NOTE — [place] Notes (Signed)
Impression and Recommendations:    1. Blood glucose elevated (& elevated A1c)   2. Hypertension, unspecified type   3. Hyperlipidemia, unspecified hyperlipidemia type   4. h/o Diastolic dysfunction with chronic heart failure ([place])   5. Chronic atrial fibrillation (HCC)   6. Hypothyroidism, unspecified type   7. Chronic anticoagulation   8. H/O hypokalemia   9. h/o Irregular heart rhythm   10. History of osteopenia   11. Urinary frequency     Orders Placed This Encounter  Procedures  . CBC with Differential/Platelet  . Comprehensive metabolic panel  . Lipid panel  . Magnesium  . Phosphorus  . T4, free  . TSH  . Vitamin B12  . VITAMIN D 25 Hydroxy (Vit-D Deficiency, Fractures)  . Urinalysis  . POCT glycosylated hemoglobin (Hb A1C)   Current Meds  Medication Sig  . acetaminophen (TYLENOL) 325 MG tablet Take 2 tablets (650 mg total) by mouth every 6 (six) hours as needed for mild pain, moderate pain, fever or headache.  . Besifloxacin HCl (BESIVANCE) 0.6 % SUSP Apply 1 drop to eye as directed.  . brimonidine (ALPHAGAN) 0.2 % ophthalmic solution Place 1 drop into both eyes 3 (three) times daily.  . Cholecalciferol (VITAMIN D3 SUPER STRENGTH) 2000 units CAPS Take 1 capsule by mouth daily.  Marland Kitchen docusate sodium (COLACE) 100 MG capsule Take 1 capsule (100 mg total) by mouth 2 (two) times daily.  . dorzolamide (TRUSOPT) 2 % ophthalmic solution Place 1 drop into both eyes daily.  . furosemide (LASIX) 20 MG tablet Take 1 tablet (20 mg total) by mouth daily.  Marland Kitchen levothyroxine (SYNTHROID, LEVOTHROID) 50 MCG tablet TAKE 1 TABLET (50 MCG TOTAL) BY MOUTH DAILY BEFORE BREAKFAST.  Marland Kitchen lovastatin (MEVACOR) 20 MG tablet TAKE 1 TABLET (20 MG TOTAL) BY MOUTH DAILY AT 6 PM.  . metoprolol tartrate (LOPRESSOR) 100 MG tablet TAKE 1 TABLET BY MOUTH DAILY.  . Multiple Vitamins-Minerals (PRESERVISION AREDS 2) CAPS Take 1 capsule by mouth 2 (two) times daily.  . potassium chloride (K-DUR) 10 MEQ  tablet Take 1 tablet (10 mEq total) by mouth daily.  . timolol (BETIMOL) 0.5 % ophthalmic solution Place 1 drop into both eyes 2 (two) times daily.  . timolol (TIMOPTIC) 0.5 % ophthalmic solution   . XARELTO 15 MG TABS tablet TAKE 1 TABLET BY MOUTH DAILY WITH SUPPER.      1. Blood glucose elevated (& elevated A1c)   2. Hypertension, unspecified type   3. Hyperlipidemia, unspecified hyperlipidemia type   4. h/o Diastolic dysfunction with chronic heart failure ([place])   5. Chronic atrial fibrillation (HCC)   6. Hypothyroidism, unspecified type   7. Chronic anticoagulation   8. H/O hypokalemia   9. h/o Irregular heart rhythm   10. History of osteopenia   11. Urinary frequency      Plan   Gross side effects, risk and benefits, and alternatives of medications and treatment plan in general discussed with patient.  Patient is aware that all medications have potential side effects and we are unable to predict every side effect or drug-drug interaction that may occur.   Patient will call with any questions prior to using medication if they have concerns.  Expresses verbal understanding and consents to current therapy and treatment regimen.  No barriers to understanding were identified.  Red flag symptoms and signs discussed in detail.  Patient expressed understanding regarding what to do in case of emergency\urgent symptoms  Please see AVS handed out to  patient at the end of our visit for further patient instructions/ counseling done pertaining to today's office visit.   Return in about 4 months (around 07/08/2017).    Note: This note was prepared with assistance of Dragon voice recognition software. Occasional wrong-word or sound-a-like substitutions may have occurred due to the inherent limitations of voice recognition software.  [name] 2:02  PM --------------------------------------------------------------------------------------------------------------------------------------------------------------------------------------------------------------------------------------------    Subjective:    CC:  Chief Complaint  Patient presents with  . Follow-up    HPI: Brianna Thompson is a 83 y.o. female who presents to [place] at [facility] today for issues as discussed below.   1. HTN HPI:  140-160's/ 70's-80's.  -  Her blood pressure has been controlled at home.   - Patient reports good compliance with blood pressure medications  - Denies medication S-E   - Smoking Status noted   - She denies new onset of: chest pain, exercise intolerance, shortness of breath, dizziness, visual changes, headache, lower extremity swelling or claudication.   Today their BP is BP: (!) 154/75   Last 3 blood pressure readings in our office are as follows: BP Readings from Last 3 Encounters:  03/10/17 (!) 154/75  11/06/16 (!) 176/93  10/07/16 (!) 152/84     Filed Weights   03/10/17 1320  Weight: 117 lb (53.1 kg)    Pre-DM;  Last A1c was done 11/06/2016 and was noted to be 6.5.  We will recheck today.  Patient states she has been peeing more frequently than usual and has had dry mouth and thirstier than usual.  No burning on urination.  No fever chills back pain.  Patient has been eating more sweets candies, cakes, cookies etc. in the past couple of months.     Wt Readings from Last 3 Encounters:  03/10/17 117 lb (53.1 kg)  11/06/16 123 lb (55.8 kg)  10/07/16 125 lb 4 oz (56.8 kg)   BP Readings from Last 3 Encounters:  03/10/17 (!) 154/75  11/06/16 (!) 176/93  10/07/16 (!) 152/84   Pulse Readings from Last 3 Encounters:  03/10/17 63  11/06/16 85  10/07/16 87   BMI Readings from Last 3 Encounters:  03/10/17 21.40 kg/m  11/06/16 22.50 kg/m  10/07/16 22.91 kg/m     Patient Care Team     Relationship Specialty Notifications Start End  Mellody Dance, DO PCP - General Family Medicine  01/31/16   Leandrew Koyanagi, MD Attending Physician Orthopedic Surgery  01/31/16   Hayden Pedro, MD Consulting Physician Ophthalmology  01/31/16   Calvert Cantor, MD Consulting Physician Optometry  03/18/16      Patient Active Problem List   Diagnosis Date Noted  . Blood glucose elevated (& elevated A1c) 04/02/2016    Priority: High  . HLD (hyperlipidemia) 03/18/2016    Priority: High  . HTN (hypertension) 02/17/2015    Priority: High  . Basal cell carcinoma-  R proximal central dorsal wrist 12/01/2016    Priority: Medium  . Chronic anticoagulation 03/18/2016    Priority: Medium  . h/o Atrial fibrillation ([place]), CHA2DS2-VASc Score 5-6 02/17/2015    Priority: Medium  . h/o Diastolic dysfunction with chronic heart failure ([place]) 02/17/2015    Priority: Medium  . Macular degeneration 03/18/2016    Priority: Low  . Hypothyroidism 02/17/2015    Priority: Low  . Glaucoma 02/17/2015    Priority: Low  . History of osteopenia 03/10/2017  . Keratoacanthoma of skin- L lower leg and possibly R  wrist are 11/06/2016  . Solar keratosis 10/07/2016  . History of skin cancer 08/15/2016  . Wound of left leg 08/15/2016  . h/o Irregular heart rhythm 03/18/2016  . Encounter for screening for diseases of the blood and blood-forming organs and certain disorders involving the immune mechanism 03/18/2016  . Counseling on health promotion and disease prevention 03/18/2016  . Hip fracture ([place]) 02/17/2015  . Closed left hip fracture ([place]) 02/17/2015  . Fall 02/17/2015  . H/O hypokalemia 02/17/2015  . Traumatic hematoma of left hand 02/17/2015  . Elevated CPK 02/17/2015    Past Medical history, Surgical history, Family history, Social history, Allergies and Medications have been entered into the medical record, reviewed and changed as needed.    Current Meds  Medication Sig  . acetaminophen  (TYLENOL) 325 MG tablet Take 2 tablets (650 mg total) by mouth every 6 (six) hours as needed for mild pain, moderate pain, fever or headache.  . Besifloxacin HCl (BESIVANCE) 0.6 % SUSP Apply 1 drop to eye as directed.  . brimonidine (ALPHAGAN) 0.2 % ophthalmic solution Place 1 drop into both eyes 3 (three) times daily.  . Cholecalciferol (VITAMIN D3 SUPER STRENGTH) 2000 units CAPS Take 1 capsule by mouth daily.  Marland Kitchen docusate sodium (COLACE) 100 MG capsule Take 1 capsule (100 mg total) by mouth 2 (two) times daily.  . dorzolamide (TRUSOPT) 2 % ophthalmic solution Place 1 drop into both eyes daily.  . furosemide (LASIX) 20 MG tablet Take 1 tablet (20 mg total) by mouth daily.  Marland Kitchen levothyroxine (SYNTHROID, LEVOTHROID) 50 MCG tablet TAKE 1 TABLET (50 MCG TOTAL) BY MOUTH DAILY BEFORE BREAKFAST.  Marland Kitchen lovastatin (MEVACOR) 20 MG tablet TAKE 1 TABLET (20 MG TOTAL) BY MOUTH DAILY AT 6 PM.  . metoprolol tartrate (LOPRESSOR) 100 MG tablet TAKE 1 TABLET BY MOUTH DAILY.  . Multiple Vitamins-Minerals (PRESERVISION AREDS 2) CAPS Take 1 capsule by mouth 2 (two) times daily.  . potassium chloride (K-DUR) 10 MEQ tablet Take 1 tablet (10 mEq total) by mouth daily.  . timolol (BETIMOL) 0.5 % ophthalmic solution Place 1 drop into both eyes 2 (two) times daily.  . timolol (TIMOPTIC) 0.5 % ophthalmic solution   . XARELTO 15 MG TABS tablet TAKE 1 TABLET BY MOUTH DAILY WITH SUPPER.    Allergies:  Allergies  Allergen Reactions  . Calcium-Containing Compounds Other (See Comments)    constipation     Review of Systems: General:   Denies fever, chills, unexplained weight loss.  Optho/Auditory:   Denies visual changes, blurred vision/LOV Respiratory:   Denies wheeze, DOE more than baseline levels.  Cardiovascular:   Denies chest pain, palpitations, new onset peripheral edema  Gastrointestinal:   Denies nausea, vomiting, diarrhea, abd pain.  Genitourinary: Denies dysuria, freq/ urgency, flank pain or discharge from  genitals.  Endocrine:     Denies hot or cold intolerance, polyuria, polydipsia. Musculoskeletal:   Denies unexplained myalgias, joint swelling, unexplained arthralgias, gait problems.  Skin:  Denies new onset rash, suspicious lesions Neurological:     Denies dizziness, unexplained weakness, numbness  Psychiatric/Behavioral:   Denies mood changes, suicidal or homicidal ideations, hallucinations    Objective:   Blood pressure (!) 154/75, pulse 63, height 5\' 2"  (1.575 m), weight 117 lb (53.1 kg). Body mass index is 21.4 kg/m. General:  Well Developed, well nourished, appropriate for stated age.  Able to move from chair to table Neuro:  Alert and oriented,  extra-ocular muscles intact  HEENT:  Normocephalic, atraumatic, neck supple, no carotid bruits  appreciated  Skin:  no gross rash, warm, pink. Cardiac:  RRR, S1 S2 Respiratory:  distant but ess ECTA B/L and A/P, Not using accessory muscles, speaking in full sentences- unlabored. Vascular:  Ext warm, mild purplish hue to skin bilateral feet and lower extremity, 1-2+ pitting edema.; cap RF less 2 sec. Psych:  No HI/SI, judgement and insight good, Euthymic mood. Full Affect.

## 2017-03-17 ENCOUNTER — Other Ambulatory Visit: Payer: Self-pay | Admitting: Adult Health

## 2017-03-20 ENCOUNTER — Other Ambulatory Visit: Payer: Medicare Other

## 2017-03-20 DIAGNOSIS — R739 Hyperglycemia, unspecified: Secondary | ICD-10-CM

## 2017-03-20 DIAGNOSIS — I5032 Chronic diastolic (congestive) heart failure: Secondary | ICD-10-CM

## 2017-03-20 DIAGNOSIS — I10 Essential (primary) hypertension: Secondary | ICD-10-CM

## 2017-03-20 DIAGNOSIS — I482 Chronic atrial fibrillation, unspecified: Secondary | ICD-10-CM

## 2017-03-20 DIAGNOSIS — E876 Hypokalemia: Secondary | ICD-10-CM

## 2017-03-20 DIAGNOSIS — Z7901 Long term (current) use of anticoagulants: Secondary | ICD-10-CM

## 2017-03-20 DIAGNOSIS — I499 Cardiac arrhythmia, unspecified: Secondary | ICD-10-CM

## 2017-03-20 DIAGNOSIS — Z8739 Personal history of other diseases of the musculoskeletal system and connective tissue: Secondary | ICD-10-CM

## 2017-03-20 DIAGNOSIS — E039 Hypothyroidism, unspecified: Secondary | ICD-10-CM

## 2017-03-20 DIAGNOSIS — E785 Hyperlipidemia, unspecified: Secondary | ICD-10-CM

## 2017-03-21 LAB — COMPREHENSIVE METABOLIC PANEL
ALBUMIN: 4.4 g/dL (ref 3.2–4.6)
ALT: 14 IU/L (ref 0–32)
AST: 20 IU/L (ref 0–40)
Albumin/Globulin Ratio: 1.7 (ref 1.2–2.2)
Alkaline Phosphatase: 107 IU/L (ref 39–117)
BILIRUBIN TOTAL: 0.5 mg/dL (ref 0.0–1.2)
BUN / CREAT RATIO: 24 (ref 12–28)
BUN: 20 mg/dL (ref 10–36)
CO2: 28 mmol/L (ref 20–29)
CREATININE: 0.85 mg/dL (ref 0.57–1.00)
Calcium: 10.1 mg/dL (ref 8.7–10.3)
Chloride: 103 mmol/L (ref 96–106)
GFR calc non Af Amer: 59 mL/min/{1.73_m2} — ABNORMAL LOW (ref >59)
GFR, EST AFRICAN AMERICAN: 68 mL/min/{1.73_m2} (ref >59)
Globulin, Total: 2.6 g/dL (ref 1.5–4.5)
Glucose: 111 mg/dL — ABNORMAL HIGH (ref 65–99)
Potassium: 4.3 mmol/L (ref 3.5–5.2)
Sodium: 146 mmol/L — ABNORMAL HIGH (ref 134–144)
TOTAL PROTEIN: 7 g/dL (ref 6.0–8.5)

## 2017-03-21 LAB — VITAMIN B12: Vitamin B-12: 393 pg/mL (ref 232–1245)

## 2017-03-21 LAB — TSH: TSH: 0.905 u[IU]/mL (ref 0.450–4.500)

## 2017-03-21 LAB — LIPID PANEL
CHOLESTEROL TOTAL: 162 mg/dL (ref 100–199)
Chol/HDL Ratio: 2.9 ratio (ref 0.0–4.4)
HDL: 55 mg/dL (ref >39)
LDL CALC: 89 mg/dL (ref 0–99)
TRIGLYCERIDES: 92 mg/dL (ref 0–149)
VLDL Cholesterol Cal: 18 mg/dL (ref 5–40)

## 2017-03-21 LAB — CBC WITH DIFFERENTIAL/PLATELET
Basophils Absolute: 0 10*3/uL (ref 0.0–0.2)
Basos: 1 %
EOS (ABSOLUTE): 0.1 10*3/uL (ref 0.0–0.4)
EOS: 1 %
HEMOGLOBIN: 12.4 g/dL (ref 11.1–15.9)
Hematocrit: 33.8 % — ABNORMAL LOW (ref 34.0–46.6)
Immature Grans (Abs): 0 10*3/uL (ref 0.0–0.1)
Immature Granulocytes: 0 %
LYMPHS ABS: 1.5 10*3/uL (ref 0.7–3.1)
Lymphs: 28 %
MCH: 36.7 pg — AB (ref 26.6–33.0)
MCHC: 36.7 g/dL — ABNORMAL HIGH (ref 31.5–35.7)
MCV: 100 fL — AB (ref 79–97)
MONOCYTES: 8 %
Monocytes Absolute: 0.5 10*3/uL (ref 0.1–0.9)
NEUTROS ABS: 3.3 10*3/uL (ref 1.4–7.0)
Neutrophils: 62 %
Platelets: 280 10*3/uL (ref 150–379)
RBC: 3.38 x10E6/uL — ABNORMAL LOW (ref 3.77–5.28)
RDW: 14.5 % (ref 12.3–15.4)
WBC: 5.3 10*3/uL (ref 3.4–10.8)

## 2017-03-21 LAB — T4, FREE: FREE T4: 1.64 ng/dL (ref 0.82–1.77)

## 2017-03-21 LAB — VITAMIN D 25 HYDROXY (VIT D DEFICIENCY, FRACTURES): VIT D 25 HYDROXY: 35.2 ng/mL (ref 30.0–100.0)

## 2017-03-21 LAB — PHOSPHORUS: Phosphorus: 3.1 mg/dL (ref 2.5–4.5)

## 2017-03-21 LAB — MAGNESIUM: Magnesium: 2 mg/dL (ref 1.6–2.3)

## 2017-03-31 ENCOUNTER — Other Ambulatory Visit: Payer: Self-pay | Admitting: Family Medicine

## 2017-04-03 ENCOUNTER — Encounter (INDEPENDENT_AMBULATORY_CARE_PROVIDER_SITE_OTHER): Payer: Medicare Other | Admitting: Ophthalmology

## 2017-04-03 DIAGNOSIS — H353211 Exudative age-related macular degeneration, right eye, with active choroidal neovascularization: Secondary | ICD-10-CM

## 2017-04-03 DIAGNOSIS — H35033 Hypertensive retinopathy, bilateral: Secondary | ICD-10-CM | POA: Diagnosis not present

## 2017-04-03 DIAGNOSIS — H43813 Vitreous degeneration, bilateral: Secondary | ICD-10-CM

## 2017-04-03 DIAGNOSIS — H34832 Tributary (branch) retinal vein occlusion, left eye, with macular edema: Secondary | ICD-10-CM | POA: Diagnosis not present

## 2017-04-03 DIAGNOSIS — I10 Essential (primary) hypertension: Secondary | ICD-10-CM

## 2017-04-03 DIAGNOSIS — H353122 Nonexudative age-related macular degeneration, left eye, intermediate dry stage: Secondary | ICD-10-CM | POA: Diagnosis not present

## 2017-04-16 ENCOUNTER — Other Ambulatory Visit: Payer: Self-pay | Admitting: Family Medicine

## 2017-04-28 ENCOUNTER — Other Ambulatory Visit: Payer: Self-pay | Admitting: Family Medicine

## 2017-04-28 DIAGNOSIS — I5032 Chronic diastolic (congestive) heart failure: Secondary | ICD-10-CM

## 2017-05-01 ENCOUNTER — Encounter (INDEPENDENT_AMBULATORY_CARE_PROVIDER_SITE_OTHER): Payer: Medicare Other | Admitting: Ophthalmology

## 2017-05-01 ENCOUNTER — Other Ambulatory Visit: Payer: Self-pay

## 2017-05-01 DIAGNOSIS — H353211 Exudative age-related macular degeneration, right eye, with active choroidal neovascularization: Secondary | ICD-10-CM | POA: Diagnosis not present

## 2017-05-01 DIAGNOSIS — H43813 Vitreous degeneration, bilateral: Secondary | ICD-10-CM

## 2017-05-01 DIAGNOSIS — I10 Essential (primary) hypertension: Secondary | ICD-10-CM

## 2017-05-01 DIAGNOSIS — H34832 Tributary (branch) retinal vein occlusion, left eye, with macular edema: Secondary | ICD-10-CM

## 2017-05-01 DIAGNOSIS — H35033 Hypertensive retinopathy, bilateral: Secondary | ICD-10-CM

## 2017-05-01 DIAGNOSIS — H353122 Nonexudative age-related macular degeneration, left eye, intermediate dry stage: Secondary | ICD-10-CM

## 2017-05-01 NOTE — Telephone Encounter (Signed)
Pharmacy sent a refill request for fluorouracil cream.  Cream was last filled by dermatology for spots on her face.  Patient's daughter states that their office will not fill because it has been 2-3 years since they have seen the patient.  Patient has a hard time getting to appointments due to her age and the daughter wants to know if our office can refill.  Sent request to Dr. Raliegh Scarlet for review, patient was last seen in the office on 03/10/2017 and has a follow up on 06/30/17. MPulliam, CMA/RT(R)

## 2017-05-01 NOTE — Telephone Encounter (Signed)
Tell them to just call and ask for rf by derm then

## 2017-05-01 NOTE — Telephone Encounter (Signed)
I can't rf that med- sorry

## 2017-05-02 NOTE — Telephone Encounter (Signed)
Pervious information is incorrect.  Spoke to her daughter [name]), medication was last filled by her pervious PCP.

## 2017-05-05 ENCOUNTER — Other Ambulatory Visit: Payer: Self-pay | Admitting: Family Medicine

## 2017-05-07 NOTE — Telephone Encounter (Signed)
Will need office visit

## 2017-05-07 NOTE — Telephone Encounter (Signed)
Daughter notified and will call back to make appointment. MPulliam, CMA/RT(R)

## 2017-05-13 ENCOUNTER — Ambulatory Visit: Payer: Medicare Other | Admitting: Family Medicine

## 2017-05-13 ENCOUNTER — Encounter: Payer: Self-pay | Admitting: Family Medicine

## 2017-05-13 VITALS — BP 178/98 | HR 84 | Ht 62.0 in | Wt 120.0 lb

## 2017-05-13 DIAGNOSIS — Z7901 Long term (current) use of anticoagulants: Secondary | ICD-10-CM | POA: Diagnosis not present

## 2017-05-13 DIAGNOSIS — I5032 Chronic diastolic (congestive) heart failure: Secondary | ICD-10-CM | POA: Diagnosis not present

## 2017-05-13 DIAGNOSIS — C4491 Basal cell carcinoma of skin, unspecified: Secondary | ICD-10-CM | POA: Diagnosis not present

## 2017-05-13 DIAGNOSIS — I482 Chronic atrial fibrillation, unspecified: Secondary | ICD-10-CM

## 2017-05-13 DIAGNOSIS — I10 Essential (primary) hypertension: Secondary | ICD-10-CM | POA: Diagnosis not present

## 2017-05-13 MED ORDER — METOPROLOL TARTRATE 100 MG PO TABS: 100.0000 mg | ORAL_TABLET | Freq: Two times a day (BID) | ORAL | 0 refills | Status: DC

## 2017-05-13 NOTE — [place] Notes (Signed)
Impression and Recommendations:    1. Essential hypertension   2. Chronic atrial fibrillation (HCC)   3. h/o Diastolic dysfunction with chronic heart failure ([place])   4. Chronic anticoagulation   5. Basal cell carcinoma (BCC), unspecified site     HTN - Patient's blood pressure is running higher than we would like.  Patient should begin taking her metoprolol tartrate twice a day, instead of only once.  This should     - Discussed the importance of keeping the patient's blood pressure down, that 184/90 is too high.  - Patient will keep a regular BP log.  This should be taken after she takes her meds, and right before she takes her meds, to evaluate the management of her blood pressure on the BID dose of metoprolol tartrate.    - Patient will return in 2-4 weeks to check on her blood pressure and review her BP log.  Advised the patient and her daughter that her blood pressure will need to be monitored very closely during this time to assess for potential issues, such as her blood pressure going too low on the new BID routine.    - We will modify her dosage if needed.  - Continue taking lasix every day in the morning.  Dermatological  - Given her history of skin cancer, patient should be obtaining a skin screening through dermatology once a year.   - Ambulatory referral to Dermatology provided today.  Patient will return to Dr. Sydnee Levans at [facility].  - Patient has been using anti-neoplastic cream for at least ten years, since 2010.  Discussed that this cream is a very powerful medicine that we do not normally prescribe.  Will leave it to dermatology to decide on a potential refill.  She has been taking it for years without any issue.  Advised the patient at length about the strength of this product, as it is a chemotherapy cream.  Hydration - Advised the patient to drink more water, aiming for half of her body weight in ounces of water.   -  Besides needing to drink more water, the rest of her labs are very reassuring.  Follow-up - Follow up in 3-4 weeks on HTN, with BP log.  Patient knows that she can return to see [place] sooner if needed.   No orders of the defined types were placed in this encounter.   Meds ordered this encounter  Medications  . metoprolol tartrate (LOPRESSOR) 100 MG tablet    Sig: Take 1 tablet (100 mg total) by mouth 2 (two) times daily.    Dispense:  180 tablet    Refill:  0    Gross side effects, risk and benefits, and alternatives of medications and treatment plan in general discussed with patient.  Patient is aware that all medications have potential side effects and we are unable to predict every side effect or drug-drug interaction that may occur.   Patient will call with any questions prior to using medication if they have concerns.  Expresses verbal understanding and consents to current therapy and treatment regimen.  No barriers to understanding were identified.  Red flag symptoms and signs discussed in detail.  Patient expressed understanding regarding what to do in case of emergency\urgent symptoms  Please see AVS handed out to patient at the end of our visit for further patient instructions/ counseling done pertaining to today's office visit.   Return for 3-4 wks f/up BP, bring log.  Note: This note was prepared with assistance of Dragon voice recognition software. Occasional wrong-word or sound-a-like substitutions may have occurred due to the inherent limitations of voice recognition software.   This document serves as a record of services personally performed by Mellody Dance, DO. It was created on her behalf by Toni Amend, a trained medical scribe. The creation of this record is based on the scribe's personal observations and the provider's statements to them.   I have reviewed the above medical documentation for accuracy and completeness and I concur.  Mellody Dance 05/14/17 12:56 PM   --------------------------------------------------------------------------------------------------------------------------------------------------------------------------------------------------------------------------------------------    Subjective:     HPI: JERICA CREEGAN is a 84 y.o. female who presents to [place] [facility] today for issues as discussed below.  She feels that she has been about the same, "I guess it's good."  In general, the patient denies any concerns.   HTN Daughter reports that patient's blood pressure may be elevated today from the act of travelling to the doctor.  They check her blood pressure three days a week.  It usually falls in the 180's, but recently it's been down in the 140's/80's-90's.    The patient takes lasix every day, in the morning.  She also takes lopressor (metoprolol tartrate) once a day, which we discussed only lasts for 12 hours, and could be contributing to her occasional high blood pressure.  Patient denies dizziness after taking her medicine, and denies dizziness after getting up really quick.  Patient notes "I don't get up that quick."  Dermatological Patient has been using anti-neoplastic cream for at least ten years, daughter reports since 2010.  Discussed that this cream is a very powerful medicine that we do not normally prescribe.  She has been taking it for years without any issue.  Patient has a reported history of basal cell carcinoma in her wrist, but has not been regularly visiting dermatology.  Hydration Patient was advised to drink more water today.  She notes that she gets up 3-4 times per night to drink water.  Vitamin D dosage was increased from 2000 to 5000.   Wt Readings from Last 3 Encounters:  05/13/17 120 lb (54.4 kg)  03/10/17 117 lb (53.1 kg)  11/06/16 123 lb (55.8 kg)   BP Readings from Last 3 Encounters:  05/13/17 (!) 178/98  03/10/17 (!) 154/75   11/06/16 (!) 176/93   Pulse Readings from Last 3 Encounters:  05/13/17 84  03/10/17 63  11/06/16 85   BMI Readings from Last 3 Encounters:  05/13/17 21.95 kg/m  03/10/17 21.40 kg/m  11/06/16 22.50 kg/m     Patient Care Team    Relationship Specialty Notifications Start End  Mellody Dance, DO PCP - General Family Medicine  01/31/16   Leandrew Koyanagi, MD Attending Physician Orthopedic Surgery  01/31/16   Hayden Pedro, MD Consulting Physician Ophthalmology  01/31/16   Calvert Cantor, MD Consulting Physician Optometry  03/18/16   Sydnee Levans, MD Referring Physician Dermatology  05/13/17      Patient Active Problem List   Diagnosis Date Noted  . Blood glucose elevated (& elevated A1c) 04/02/2016    Priority: High  . HLD (hyperlipidemia) 03/18/2016    Priority: High  . HTN (hypertension) 02/17/2015    Priority: High  . Basal cell carcinoma-  R proximal central dorsal wrist 12/01/2016    Priority: Medium  . Chronic anticoagulation 03/18/2016    Priority: Medium  . h/o Atrial fibrillation ([place]),  CHA2DS2-VASc Score 5-6 02/17/2015    Priority: Medium  . h/o Diastolic dysfunction with chronic heart failure ([place]) 02/17/2015    Priority: Medium  . Macular degeneration 03/18/2016    Priority: Low  . Hypothyroidism 02/17/2015    Priority: Low  . Glaucoma 02/17/2015    Priority: Low  . History of osteopenia 03/10/2017  . 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 Solar keratosis 10/07/2016  . History of skin cancer 08/15/2016  . Wound of left leg 08/15/2016  . h/o Irregular heart rhythm 03/18/2016  . Encounter for screening for diseases of the blood and blood-forming organs and certain disorders involving the immune mechanism 03/18/2016  . Counseling on health promotion and disease prevention 03/18/2016  . Hip fracture ([place]) 02/17/2015  . Closed left hip fracture ([place]) 02/17/2015  . Fall 02/17/2015  . H/O hypokalemia 02/17/2015   . Traumatic hematoma of left hand 02/17/2015  . Elevated CPK 02/17/2015    Past Medical history, Surgical history, Family history, Social history, Allergies and Medications have been entered into the medical record, reviewed and changed as needed.    Current Meds  Medication Sig  . acetaminophen (TYLENOL) 325 MG tablet Take 2 tablets (650 mg total) by mouth every 6 (six) hours as needed for mild pain, moderate pain, fever or headache.  . Besifloxacin HCl (BESIVANCE) 0.6 % SUSP Apply 1 drop to eye as directed.  . brimonidine (ALPHAGAN) 0.2 % ophthalmic solution Place 1 drop into both eyes 3 (three) times daily.  . Cholecalciferol (VITAMIN D3 SUPER STRENGTH) 2000 units CAPS Take 1 capsule by mouth daily.  Marland Kitchen docusate sodium (COLACE) 100 MG capsule Take 1 capsule (100 mg total) by mouth 2 (two) times daily.  . dorzolamide (TRUSOPT) 2 % ophthalmic solution Place 1 drop into both eyes daily.  . fluorouracil (EFUDEX) 5 % cream Apply topically 2 (two) times daily.  . furosemide (LASIX) 20 MG tablet TAKE 1 TABLET (20 MG TOTAL) BY MOUTH DAILY.  Marland Kitchen levothyroxine (SYNTHROID, LEVOTHROID) 50 MCG tablet TAKE 1 TABLET BY MOUTH DAILY BEFORE BREAKFAST.  Marland Kitchen lovastatin (MEVACOR) 20 MG tablet TAKE 1 TABLET BY MOUTH DAILY AT 6:00 PM.  . metoprolol tartrate (LOPRESSOR) 100 MG tablet Take 1 tablet (100 mg total) by mouth 2 (two) times daily.  . Multiple Vitamins-Minerals (PRESERVISION AREDS 2) CAPS Take 1 capsule by mouth 2 (two) times daily.  . potassium chloride (K-DUR) 10 MEQ tablet TAKE 1 TABLET BY MOUTH DAILY.  Marland Kitchen timolol (BETIMOL) 0.5 % ophthalmic solution Place 1 drop into both eyes 2 (two) times daily.  . timolol (TIMOPTIC) 0.5 % ophthalmic solution   . XARELTO 15 MG TABS tablet TAKE 1 TABLET BY MOUTH DAILY WITH SUPPER  . [DISCONTINUED] metoprolol tartrate (LOPRESSOR) 100 MG tablet TAKE 1 TABLET BY MOUTH DAILY.    Allergies:  Allergies  Allergen Reactions  . Calcium-Containing Compounds Other (See  Comments)    constipation    Review of Systems:  A fourteen system review of systems was performed and found to be positive as per HPI.   Objective:   Blood pressure (!) 178/98, pulse 84, height 5\' 2"  (1.575 m), weight 120 lb (54.4 kg), SpO2 98 %. Body mass index is 21.95 kg/m. General:  Well Developed, well nourished, appropriate for stated age.  Neuro:  Alert and oriented,  extra-ocular muscles intact  HEENT:  Normocephalic, atraumatic, neck supple, no carotid bruits appreciated  Skin:  no gross rash, warm, pink. Cardiac:  Irregularly  irregular, S1 S2 Respiratory:  ECTA B/L and A/P, Not using accessory muscles, speaking in full sentences- unlabored. Vascular:  Ext warm, no cyanosis apprec.; cap RF less 2 sec. Psych:  No HI/SI, judgement and insight good, Euthymic mood. Full Affect.

## 2017-05-13 NOTE — Patient Instructions (Signed)
Please follow-up at [place] Dr. Font[place] No, you should go for yearly skin screenings and if they feel the Efudex prescription cream is appropriate you will get it from them.    Follow-up in about 3-4 weeks, bring me your blood pressure log of what your blood pressure is right after you take your meds and sometimes right before you take meds and make sure it is stable.

## 2017-05-23 DIAGNOSIS — Z85828 Personal history of other malignant neoplasm of skin: Secondary | ICD-10-CM | POA: Diagnosis not present

## 2017-05-23 DIAGNOSIS — L821 Other seborrheic keratosis: Secondary | ICD-10-CM | POA: Diagnosis not present

## 2017-05-23 DIAGNOSIS — L820 Inflamed seborrheic keratosis: Secondary | ICD-10-CM | POA: Diagnosis not present

## 2017-05-28 ENCOUNTER — Encounter (INDEPENDENT_AMBULATORY_CARE_PROVIDER_SITE_OTHER): Payer: Medicare Other | Admitting: Ophthalmology

## 2017-05-28 DIAGNOSIS — H34832 Tributary (branch) retinal vein occlusion, left eye, with macular edema: Secondary | ICD-10-CM | POA: Diagnosis not present

## 2017-05-28 DIAGNOSIS — H353211 Exudative age-related macular degeneration, right eye, with active choroidal neovascularization: Secondary | ICD-10-CM

## 2017-05-28 DIAGNOSIS — H353122 Nonexudative age-related macular degeneration, left eye, intermediate dry stage: Secondary | ICD-10-CM

## 2017-05-28 DIAGNOSIS — H35033 Hypertensive retinopathy, bilateral: Secondary | ICD-10-CM | POA: Diagnosis not present

## 2017-05-28 DIAGNOSIS — I10 Essential (primary) hypertension: Secondary | ICD-10-CM

## 2017-05-28 DIAGNOSIS — H43813 Vitreous degeneration, bilateral: Secondary | ICD-10-CM

## 2017-06-10 ENCOUNTER — Encounter: Payer: Self-pay | Admitting: Family Medicine

## 2017-06-10 ENCOUNTER — Ambulatory Visit: Payer: Medicare Other | Admitting: Family Medicine

## 2017-06-10 VITALS — BP 148/84 | HR 95 | Ht 62.0 in | Wt 120.0 lb

## 2017-06-10 DIAGNOSIS — I10 Essential (primary) hypertension: Secondary | ICD-10-CM | POA: Diagnosis not present

## 2017-06-10 DIAGNOSIS — Z23 Encounter for immunization: Secondary | ICD-10-CM | POA: Diagnosis not present

## 2017-06-10 DIAGNOSIS — I482 Chronic atrial fibrillation, unspecified: Secondary | ICD-10-CM

## 2017-06-10 DIAGNOSIS — I5032 Chronic diastolic (congestive) heart failure: Secondary | ICD-10-CM | POA: Diagnosis not present

## 2017-06-10 DIAGNOSIS — Z7901 Long term (current) use of anticoagulants: Secondary | ICD-10-CM

## 2017-06-10 LAB — POCT GLYCOSYLATED HEMOGLOBIN (HGB A1C): Hemoglobin A1C: 6.3

## 2017-06-10 MED ORDER — ZOSTER VAC RECOMB ADJUVANTED 50 MCG/0.5ML IM SUSR: 0.5000 mL | Freq: Once | INTRAMUSCULAR | 0 refills | Status: AC

## 2017-06-10 MED ORDER — METOPROLOL TARTRATE 100 MG PO TABS: 100.0000 mg | ORAL_TABLET | Freq: Every day | ORAL | 2 refills | Status: DC

## 2017-06-10 NOTE — Progress Notes (Signed)
Impression and Recommendations:    1. Essential hypertension   2. h/o Diastolic dysfunction with chronic heart failure ([place])   3. Chronic anticoagulation   4. Chronic atrial fibrillation (HCC)   5. Need for zoster vaccination    1. HTN Blood pressure today is perfect for her age at 3/84.  Since patient has always been on 12hr metoprolol, we tried to place her on 24 hour metoprolol dose last OV to try to even out her recent high blood pressure.  Even though she is only on a 12 hour pill, she shifted back down to one metoprolol tablet daily when her blood pressure ran too low.  For now, we will keep her on the 100mg  metoprolol 12-hr tablet, once daily.  Reviewed that when she takes her one metoprolol in the morning, she should have good relief for 12-14 hours. Theoretically, her blood pressure at night might come up a little since it only lasts 12 hours. However, reviewed that when we're older, medicine may stay in the system longer due to kidney filtration being less efficient. Advised to begin checking patient's blood pressure at night, if possible - every other day - to assess for BP fluctuation.  Patient will continue to monitor blood pressure at home, and keep a blood pressure log.  2. Health Maintenance Continue taking Calcium and Vitamin D every day.  Continue on Xarelto as prescribed.  Advised the patient to be as active as possible within her limitations. Keeping her steady is the most important thing so that she does not fall and break anything.  If she does get SOB more than usual, or swelling in the legs, we may consider an echocardiogram in the future, since last was measured in 2016.  Reviewed information on the pneumonia vaccine with the patient today. No information found to recommend a booster after a certain age.  Shingrix recommended to the patient today.  Advised the patient to drink more water - at least half of her body weight in ounces of water per  day.  3. Blood Glucose A1C was elevated at last visit.   Back on 03/10/2017 A1C was 6.7.  A1C measurement is 6.3 today.  Patient should continue cutting out sugar cookies and drinking water instead of soda.  4. Follow-Up  Patient needs series of both Shingrix shots. Return in 4 months.  Re-check A1C, and chronic care management. Patient knows that she can come see [place] sooner if needed.   Education and routine counseling performed. Handouts provided.  Orders Placed This Encounter  Procedures  . POCT glycosylated hemoglobin (Hb A1C)    Meds ordered this encounter  Medications  . metoprolol tartrate (LOPRESSOR) 100 MG tablet    Sig: Take 1 tablet (100 mg total) by mouth daily.    Dispense:  90 tablet    Refill:  2  . Zoster Vaccine Adjuvanted [facility]) injection    Sig: Inject 0.5 mLs into the muscle once for 1 dose.    Dispense:  0.5 mL    Refill:  0     The patient was counseled, risk factors were discussed, anticipatory guidance given.  Gross side effects, risk and benefits, and alternatives of medications discussed with patient.  Patient is aware that all medications have potential side effects and we are unable to predict every side effect or drug-drug interaction that may occur.  Expresses verbal understanding and consents to current therapy plan and treatment regimen.  Return in about 4 months (around 10/08/2017) for -  Reck A1c, and chronic care mgt.  Please see AVS handed out to patient at the end of our visit for further patient instructions/ counseling done pertaining to today's office visit.    Note: This document was prepared using Dragon voice recognition software and may include unintentional dictation errors.    This document serves as a record of services personally performed by Mellody Dance, DO. It was created on her behalf by Toni Amend, a trained medical scribe. The creation of this record is based on the scribe's personal observations and the  provider's statements to them.   I have reviewed the above medical documentation for accuracy and completeness and I concur.  Mellody Dance 06/21/17 3:44 PM    Subjective:    HPI: LUCITA MONTOYA is a 84 y.o. female who presents to [place] at [facility] today for follow up for HTN.    A1C was elevated at last visit.  It is 6.3 today.  Denies worsening symptoms of heart disease, worsening of swelling in her legs, trouble lying flat at night.  She sleeps on one pillow.  Continues taking Calcium and Vitamin D every day.  Continues on Xarelto as prescribed.  She sometimes takes a stool softener for her bowels, but not often.  HTN:  -  Her blood pressure has been controlled at home.   Blood pressure measurements at home have been:  When patient is on one 100 mg of 12 hour metoprolol per day, blood pressure runs 124/63 pulse 75 133/83 pulse 73 107/57 pulse 72 127/67 pulse 74 Highest was 147/77 pulse 84  Was on metoprolol tartrate 100 mg, twice daily.   Patient felt dizzy and lightheaded on the two-pill dose. Daughter dropped dose down to one metoprolol.  - Patient reports good compliance with blood pressure medications  - Denies medication S-E   - Smoking Status noted   - She denies new onset of: chest pain, exercise intolerance, shortness of breath, dizziness, visual changes, headache, lower extremity swelling or claudication.   Last 3 blood pressure readings in our office are as follows: BP Readings from Last 3 Encounters:  06/10/17 (!) 148/84  05/13/17 (!) 178/98  03/10/17 (!) 154/75    Pulse Readings from Last 3 Encounters:  06/10/17 95  05/13/17 84  03/10/17 63    Filed Weights   06/10/17 1036  Weight: 120 lb (54.4 kg)      Patient Care Team    Relationship Specialty Notifications Start End  Mellody Dance, DO PCP - General Family Medicine  01/31/16   Leandrew Koyanagi, MD Attending Physician Orthopedic Surgery  01/31/16     Hayden Pedro, MD Consulting Physician Ophthalmology  01/31/16   Calvert Cantor, MD Consulting Physician Optometry  03/18/16   Sydnee Levans, MD Referring Physician Dermatology  05/13/17      Lab Results  Component Value Date   CREATININE 0.85 03/20/2017   BUN 20 03/20/2017   NA 146 (H) 03/20/2017   K 4.3 03/20/2017   CL 103 03/20/2017   CO2 28 03/20/2017    Lab Results  Component Value Date   CHOL 162 03/20/2017   CHOL 143 03/21/2016    Lab Results  Component Value Date   HDL 55 03/20/2017   HDL 54 03/21/2016    Lab Results  Component Value Date   LDLCALC 89 03/20/2017   [place] 68 03/21/2016    Lab Results  Component Value Date   TRIG 92 03/20/2017   TRIG 104  03/21/2016    Lab Results  Component Value Date   CHOLHDL 2.9 03/20/2017   CHOLHDL 2.6 03/21/2016    No results found for: LDLDIRECT ===================================================================   Patient Active Problem List   Diagnosis Date Noted  . Blood glucose elevated (& elevated A1c) 04/02/2016    Priority: High  . HLD (hyperlipidemia) 03/18/2016    Priority: High  . HTN (hypertension) 02/17/2015    Priority: High  . Basal cell carcinoma-  R proximal central dorsal wrist 12/01/2016    Priority: Medium  . Chronic anticoagulation 03/18/2016    Priority: Medium  . h/o Atrial fibrillation ([place]), CHA2DS2-VASc Score 5-6 02/17/2015    Priority: Medium  . h/o Diastolic dysfunction with chronic heart failure ([place]) 02/17/2015    Priority: Medium  . Macular degeneration 03/18/2016    Priority: Low  . Hypothyroidism 02/17/2015    Priority: Low  . Glaucoma 02/17/2015    Priority: Low  . History of osteopenia 03/10/2017  . 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 Solar keratosis 10/07/2016  . History of skin cancer 08/15/2016  . Wound of left leg 08/15/2016  . h/o Irregular heart rhythm 03/18/2016  . Encounter for screening for diseases of the  blood and blood-forming organs and certain disorders involving the immune mechanism 03/18/2016  . Counseling on health promotion and disease prevention 03/18/2016  . Hip fracture ([place]) 02/17/2015  . Closed left hip fracture ([place]) 02/17/2015  . Fall 02/17/2015  . H/O hypokalemia 02/17/2015  . Traumatic hematoma of left hand 02/17/2015  . Elevated CPK 02/17/2015     Past Medical History:  Diagnosis Date  . Atrial fibrillation ([place])   . Glaucoma 02/17/2015  . Hypokalemia   . Hypothyroidism   . Irregular heart rhythm   . Macular degeneration      Past Surgical History:  Procedure Laterality Date  . ABDOMINAL HYSTERECTOMY    . APPENDECTOMY    . CATARACT EXTRACTION Bilateral   . INTRAMEDULLARY (IM) NAIL INTERTROCHANTERIC Left 02/19/2015   Procedure: INTRAMEDULLARY (IM) NAIL INTERTROCHANTRIC;  Surgeon: Leandrew Koyanagi, MD;  Location: [facility];  Service: Orthopedics;  Laterality: Left;     Family History  Problem Relation Age of Onset  . Heart attack Father   . Diabetes Sister   . Cancer Brother        skin  . Cancer Son        prostate  . Cancer Brother        lung  . Cancer Brother        blood  . Cancer Brother        brain  . Heart attack Brother   . ALS Sister      Social History   Substance and Sexual Activity  Drug Use No  ,  Social History   Substance and Sexual Activity  Alcohol Use No  ,  Social History   Tobacco Use  Smoking Status Never Smoker  Smokeless Tobacco Never Used  ,    Current Outpatient Medications on File Prior to Visit  Medication Sig Dispense Refill  . acetaminophen (TYLENOL) 325 MG tablet Take 2 tablets (650 mg total) by mouth every 6 (six) hours as needed for mild pain, moderate pain, fever or headache.    . Besifloxacin HCl (BESIVANCE) 0.6 % SUSP Apply 1 drop to eye as directed.    . brimonidine (ALPHAGAN) 0.2 % ophthalmic solution Place 1 drop into both eyes 3 (three) times daily.  3  . Cholecalciferol (VITAMIN D3 SUPER STRENGTH)  2000 units CAPS Take 1 capsule by mouth daily.    Marland Kitchen docusate sodium (COLACE) 100 MG capsule Take 1 capsule (100 mg total) by mouth 2 (two) times daily. 10 capsule 0  . dorzolamide (TRUSOPT) 2 % ophthalmic solution Place 1 drop into both eyes daily.  2  . fluorouracil (EFUDEX) 5 % cream Apply topically 2 (two) times daily.    . furosemide (LASIX) 20 MG tablet TAKE 1 TABLET (20 MG TOTAL) BY MOUTH DAILY. 90 tablet 1  . levothyroxine (SYNTHROID, LEVOTHROID) 50 MCG tablet TAKE 1 TABLET BY MOUTH DAILY BEFORE BREAKFAST. 90 tablet 0  . Multiple Vitamins-Minerals (PRESERVISION AREDS 2) CAPS Take 1 capsule by mouth 2 (two) times daily.    . potassium chloride (K-DUR) 10 MEQ tablet TAKE 1 TABLET BY MOUTH DAILY. 180 tablet 0  . timolol (BETIMOL) 0.5 % ophthalmic solution Place 1 drop into both eyes 2 (two) times daily.    . timolol (TIMOPTIC) 0.5 % ophthalmic solution   3  . XARELTO 15 MG TABS tablet TAKE 1 TABLET BY MOUTH DAILY WITH SUPPER 90 tablet 0   No current facility-administered medications on file prior to visit.      Allergies  Allergen Reactions  . Calcium-Containing Compounds Other (See Comments)    constipation     Review of Systems:   General:  Denies fever, chills Optho/Auditory:   Denies visual changes, blurred vision Respiratory:   Denies SOB, cough, wheeze, DIB  Cardiovascular:   Denies chest pain, palpitations, painful respirations Gastrointestinal:   Denies nausea, vomiting, diarrhea.  Endocrine:     Denies new hot or cold intolerance Musculoskeletal:  Denies joint swelling, gait issues, or new unexplained myalgias/ arthralgias Skin:  Denies rash, suspicious lesions  Neurological:    Denies dizziness, unexplained weakness, numbness  Psychiatric/Behavioral:   Denies mood changes   Objective:    Blood pressure (!) 148/84, pulse 95, height 5\' 2"  (1.575 m), weight 120 lb (54.4 kg), SpO2 97 %.  Body mass index is 21.95 kg/m.  General: Well Developed, well nourished, and  in no acute distress.  HEENT: Normocephalic, atraumatic, pupils equal round reactive to light, neck supple, No carotid bruits, no JVD Skin: Warm and dry, cap RF less 2 sec Cardiac: Regular rate and rhythm, S1, S2 WNL's, no murmurs rubs or gallops 1+ pitting edema bilateral lower extremity. Respiratory: ECTA B/L, Not using accessory muscles, speaking in full sentences. NeuroM-Sk: Ambulates w/o assistance, moves ext * 4 w/o difficulty, sensation grossly intact.  Ext: scant edema b/l lower ext Psych: No HI/SI, judgement and insight good, Euthymic mood. Full Affect.

## 2017-06-10 NOTE — Patient Instructions (Signed)
Continue to monitor blood pressure at home and write down occasionally.  Ms. Brianna Thompson can give you a blood pressure sheet to write it down please just ask her for that log.

## 2017-06-18 ENCOUNTER — Other Ambulatory Visit: Payer: Self-pay | Admitting: Family Medicine

## 2017-06-26 ENCOUNTER — Encounter (INDEPENDENT_AMBULATORY_CARE_PROVIDER_SITE_OTHER): Payer: Medicare Other | Admitting: Ophthalmology

## 2017-06-26 DIAGNOSIS — H35033 Hypertensive retinopathy, bilateral: Secondary | ICD-10-CM

## 2017-06-26 DIAGNOSIS — H43813 Vitreous degeneration, bilateral: Secondary | ICD-10-CM

## 2017-06-26 DIAGNOSIS — H353211 Exudative age-related macular degeneration, right eye, with active choroidal neovascularization: Secondary | ICD-10-CM | POA: Diagnosis not present

## 2017-06-26 DIAGNOSIS — H34832 Tributary (branch) retinal vein occlusion, left eye, with macular edema: Secondary | ICD-10-CM | POA: Diagnosis not present

## 2017-06-26 DIAGNOSIS — I10 Essential (primary) hypertension: Secondary | ICD-10-CM | POA: Diagnosis not present

## 2017-06-26 DIAGNOSIS — H353122 Nonexudative age-related macular degeneration, left eye, intermediate dry stage: Secondary | ICD-10-CM

## 2017-06-30 ENCOUNTER — Ambulatory Visit: Payer: Medicare Other | Admitting: Family Medicine

## 2017-07-01 ENCOUNTER — Other Ambulatory Visit: Payer: Self-pay | Admitting: Family Medicine

## 2017-07-14 ENCOUNTER — Other Ambulatory Visit: Payer: Self-pay | Admitting: Family Medicine

## 2017-07-19 ENCOUNTER — Other Ambulatory Visit: Payer: Self-pay | Admitting: Family Medicine

## 2017-07-24 ENCOUNTER — Encounter (INDEPENDENT_AMBULATORY_CARE_PROVIDER_SITE_OTHER): Payer: Medicare Other | Admitting: Ophthalmology

## 2017-07-31 ENCOUNTER — Encounter (INDEPENDENT_AMBULATORY_CARE_PROVIDER_SITE_OTHER): Payer: Medicare Other | Admitting: Ophthalmology

## 2017-07-31 DIAGNOSIS — H34832 Tributary (branch) retinal vein occlusion, left eye, with macular edema: Secondary | ICD-10-CM | POA: Diagnosis not present

## 2017-07-31 DIAGNOSIS — I10 Essential (primary) hypertension: Secondary | ICD-10-CM | POA: Diagnosis not present

## 2017-07-31 DIAGNOSIS — H353211 Exudative age-related macular degeneration, right eye, with active choroidal neovascularization: Secondary | ICD-10-CM | POA: Diagnosis not present

## 2017-07-31 DIAGNOSIS — H35033 Hypertensive retinopathy, bilateral: Secondary | ICD-10-CM

## 2017-07-31 DIAGNOSIS — H353122 Nonexudative age-related macular degeneration, left eye, intermediate dry stage: Secondary | ICD-10-CM | POA: Diagnosis not present

## 2017-07-31 DIAGNOSIS — H43813 Vitreous degeneration, bilateral: Secondary | ICD-10-CM | POA: Diagnosis not present

## 2017-08-26 DIAGNOSIS — Z961 Presence of intraocular lens: Secondary | ICD-10-CM | POA: Diagnosis not present

## 2017-08-26 DIAGNOSIS — H353212 Exudative age-related macular degeneration, right eye, with inactive choroidal neovascularization: Secondary | ICD-10-CM | POA: Diagnosis not present

## 2017-08-26 DIAGNOSIS — H34832 Tributary (branch) retinal vein occlusion, left eye, with macular edema: Secondary | ICD-10-CM | POA: Diagnosis not present

## 2017-08-26 DIAGNOSIS — H401133 Primary open-angle glaucoma, bilateral, severe stage: Secondary | ICD-10-CM | POA: Diagnosis not present

## 2017-08-28 ENCOUNTER — Encounter (INDEPENDENT_AMBULATORY_CARE_PROVIDER_SITE_OTHER): Payer: Medicare Other | Admitting: Ophthalmology

## 2017-08-28 DIAGNOSIS — I10 Essential (primary) hypertension: Secondary | ICD-10-CM

## 2017-08-28 DIAGNOSIS — H353122 Nonexudative age-related macular degeneration, left eye, intermediate dry stage: Secondary | ICD-10-CM | POA: Diagnosis not present

## 2017-08-28 DIAGNOSIS — H43813 Vitreous degeneration, bilateral: Secondary | ICD-10-CM | POA: Diagnosis not present

## 2017-08-28 DIAGNOSIS — H353211 Exudative age-related macular degeneration, right eye, with active choroidal neovascularization: Secondary | ICD-10-CM

## 2017-08-28 DIAGNOSIS — H34832 Tributary (branch) retinal vein occlusion, left eye, with macular edema: Secondary | ICD-10-CM | POA: Diagnosis not present

## 2017-08-28 DIAGNOSIS — H35033 Hypertensive retinopathy, bilateral: Secondary | ICD-10-CM | POA: Diagnosis not present

## 2017-09-15 ENCOUNTER — Other Ambulatory Visit: Payer: Self-pay | Admitting: Family Medicine

## 2017-09-25 ENCOUNTER — Encounter (INDEPENDENT_AMBULATORY_CARE_PROVIDER_SITE_OTHER): Payer: Medicare Other | Admitting: Ophthalmology

## 2017-09-25 DIAGNOSIS — H34832 Tributary (branch) retinal vein occlusion, left eye, with macular edema: Secondary | ICD-10-CM

## 2017-09-25 DIAGNOSIS — H353211 Exudative age-related macular degeneration, right eye, with active choroidal neovascularization: Secondary | ICD-10-CM | POA: Diagnosis not present

## 2017-09-25 DIAGNOSIS — I10 Essential (primary) hypertension: Secondary | ICD-10-CM | POA: Diagnosis not present

## 2017-09-25 DIAGNOSIS — H35033 Hypertensive retinopathy, bilateral: Secondary | ICD-10-CM | POA: Diagnosis not present

## 2017-09-25 DIAGNOSIS — H353122 Nonexudative age-related macular degeneration, left eye, intermediate dry stage: Secondary | ICD-10-CM | POA: Diagnosis not present

## 2017-09-25 DIAGNOSIS — H43813 Vitreous degeneration, bilateral: Secondary | ICD-10-CM | POA: Diagnosis not present

## 2017-10-06 ENCOUNTER — Encounter: Payer: Self-pay | Admitting: Family Medicine

## 2017-10-06 ENCOUNTER — Ambulatory Visit: Payer: Medicare Other | Admitting: Family Medicine

## 2017-10-06 VITALS — BP 122/66 | HR 59 | Ht 62.0 in | Wt 115.0 lb

## 2017-10-06 DIAGNOSIS — Z7901 Long term (current) use of anticoagulants: Secondary | ICD-10-CM

## 2017-10-06 DIAGNOSIS — Z79899 Other long term (current) drug therapy: Secondary | ICD-10-CM

## 2017-10-06 DIAGNOSIS — I482 Chronic atrial fibrillation, unspecified: Secondary | ICD-10-CM

## 2017-10-06 DIAGNOSIS — E611 Iron deficiency: Secondary | ICD-10-CM | POA: Diagnosis not present

## 2017-10-06 DIAGNOSIS — I10 Essential (primary) hypertension: Secondary | ICD-10-CM

## 2017-10-06 DIAGNOSIS — I5032 Chronic diastolic (congestive) heart failure: Secondary | ICD-10-CM | POA: Diagnosis not present

## 2017-10-06 DIAGNOSIS — R739 Hyperglycemia, unspecified: Secondary | ICD-10-CM | POA: Diagnosis not present

## 2017-10-06 DIAGNOSIS — E559 Vitamin D deficiency, unspecified: Secondary | ICD-10-CM

## 2017-10-06 DIAGNOSIS — E039 Hypothyroidism, unspecified: Secondary | ICD-10-CM

## 2017-10-06 DIAGNOSIS — E785 Hyperlipidemia, unspecified: Secondary | ICD-10-CM

## 2017-10-06 DIAGNOSIS — E876 Hypokalemia: Secondary | ICD-10-CM

## 2017-10-06 LAB — POCT GLYCOSYLATED HEMOGLOBIN (HGB A1C): Hemoglobin A1C: 6.3 % — AB (ref 4.0–5.6)

## 2017-10-06 MED ORDER — METOPROLOL TARTRATE 100 MG PO TABS: 100.0000 mg | ORAL_TABLET | Freq: Every day | ORAL | 1 refills | Status: AC

## 2017-10-06 MED ORDER — POTASSIUM CHLORIDE ER 10 MEQ PO TBCR: 10.0000 meq | EXTENDED_RELEASE_TABLET | Freq: Every day | ORAL | 0 refills | Status: DC

## 2017-10-06 MED ORDER — RIVAROXABAN 15 MG PO TABS: ORAL_TABLET | ORAL | 1 refills | Status: AC

## 2017-10-06 NOTE — Patient Instructions (Signed)
Continue to monitor your blood pressure every other day especially take it if you develop any lightheadedness or dizziness.  Please try to make sure you are drinking adequate amounts of water which is one half of your weight in ounces of water per day  Please try to use your walker rather than just your cane when you are ever outside of your home

## 2017-10-06 NOTE — Progress Notes (Signed)
Impression and Recommendations:    1. Blood glucose elevated (& elevated A1c)   2. Essential hypertension   3. Hyperlipidemia, unspecified hyperlipidemia type   4. h/o Diastolic dysfunction with chronic heart failure ([place])   5. Hypothyroidism, unspecified type   6. Iron deficiency   7. Vitamin D insufficiency   8. Chronic atrial fibrillation (HCC)   9. Chronic anticoagulation   10. High risk medication use   11. Hypokalemia due to loss of potassium     1. Blood Glucose - Prediabetes - A1c measurement was stable at 6.3 today. - Did not improve from last measurement, but did not worsen.  - Reviewed that normal A1c is under 6.5, with a value above this as diabetic.  - Continue dietary modification, cutting out sugar cookies, coca-colas, and other high-carb, high glycemic index foods.  Patient may begin drinking half sugar soda, half diet soda to ease transition.  - Return in 3 months for follow-up and A1c re-check.  2. HTN - No changes made to treatment today.  Will continue to monitor closely. - Patient has attempted to modify her diet since last appointment, cutting back on sugar intake (sugar cookies, coca-cola).  - Continue with one tablet of metoprolol daily.  - Patient knows to watch for dizziness and lightheadedness associated with BP.  - If she becomes dizzy or lightheaded in the slightest, patient and her caretaker know to return to the clinic - especially if new symptoms emerge or become alarming.  - Patient will keep an eye on symptoms such as more SOB than usual, or new or unusual swelling in the legs.  - Continue to monitor ambulatory blood pressure at home.  Advised that lack of activity will cause patient's blood pressure to run lower, due to lack of stress on the body.  - Bring a blood pressure log to next follow-up.  History of Atrial Fibrillation and Chronic Anticoagulation - Continue on Xarelto as prescribed.  3. Vitamin D Insufficiency - No  changes made to treatment today. - Continue Vitamin D as prescribed.  4. Ferrous Sulfate - No changes made to treatment today. - Continue supplemental iron as prescribed.  5. Cholesterol - No changes made to treatment today. - Continue statin as prescribed before bedtime.  6. Preventative Health Maintenance - Patient advised to begin using her walker on a consistent basis, instead of her cane.  - Reviewed at length that patient needs to stay steady to prevent falls or broken bones, especially at her age.  - Strongly advised the patient to be as active as possible for a 84 year old.  - Advised the patient to continue drinking more water - at least half of her body weight in ounces of water per day.  7. Follow-Up - Continue to keep an eye on blood pressure and hydration, and return for regularly scheduled follow-up.   Education and routine counseling performed. Handouts provided.  Orders Placed This Encounter  Procedures  . POCT glycosylated hemoglobin (Hb A1C)    Meds ordered this encounter  Medications  . metoprolol tartrate (LOPRESSOR) 100 MG tablet    Sig: Take 1 tablet (100 mg total) by mouth daily.    Dispense:  90 tablet    Refill:  1  . potassium chloride (K-DUR) 10 MEQ tablet    Sig: Take 1 tablet (10 mEq total) by mouth daily.    Dispense:  180 tablet    Refill:  0  . Rivaroxaban (XARELTO) 15 MG TABS tablet  Sig: TAKE 1 TABLET BY MOUTH DAILY WITH SUPPER    Dispense:  90 tablet    Refill:  1     The patient was counseled, risk factors were discussed, anticipatory guidance given.  Gross side effects, risk and benefits, and alternatives of medications discussed with patient.  Patient is aware that all medications have potential side effects and we are unable to predict every side effect or drug-drug interaction that may occur.  Expresses verbal understanding and consents to current therapy plan and treatment regimen.  Return for a1c 3-4 mo, bp reck, wt  check.  Please see AVS handed out to patient at the end of our visit for further patient instructions/ counseling done pertaining to today's office visit.    Note: This document was prepared using Dragon voice recognition software and may include unintentional dictation errors.   This document serves as a record of services personally performed by Mellody Dance, DO. It was created on her behalf by Toni Amend, a trained medical scribe. The creation of this record is based on the scribe's personal observations and the provider's statements to them.   I have reviewed the above medical documentation for accuracy and completeness and I concur.  Mellody Dance 10/12/17 10:00 PM    Subjective:    HPI: Brianna Thompson is a 84 y.o. female who presents to [place] [facility] today for follow up for HTN.  Overall, she has no new concerns today.  Patient is still eating her sugar cookies, but trying to eat less.  Patient notes that she's been trying to move around a little bit more.  She does not garden or do any particular active hobbies.  Notes "I just move from chair to chair."  Patient likes eating sugar cookies and drinking two small bottles of coca-cola per day.  Patient was walking from the kitchen to the living room; had her cane in one hand and a flashlight in the other.  Caretaker notes that the patient fell, but really more "just sat down on her bottom and couldn't get up."  She bruised up her left arm, but was otherwise unharmed.  Patient continues on supplemental iron and vitamin D daily.  Cardiac Health Patient denies any racing heart or other symptoms from atrial fibrillation.  She continues on metoprolol and other management as prescribed by prior specialist, Dr. Arelia Sneddon.  HTN:  -  Her blood pressure has been controlled at home.  According to her caretaker, the patient usually feels fine asymptomatic.  Average blood pressure range is: 97-147 /  59-85.  114/62 pulse 66 116/63 pulse 76 143/87 pulse 81 102/56 pulse 73 101/63 pulse 72 114/64 pulse 72  Had a few occasional high blood pressures due to her fall, and occasionally has low blood pressure.  - Patient reports good compliance with blood pressure medications.  - Denies medication S-E   - Smoking Status noted   - She denies new onset of: chest pain, exercise intolerance, shortness of breath, dizziness, visual changes, headache, lower extremity swelling or claudication.   Patient denies symptoms of CHF; has no difficulty lying flat at night, no issue breathing at night.  Denies new swelling in her legs.  Last 3 blood pressure readings in our office are as follows: BP Readings from Last 3 Encounters:  10/06/17 122/66  06/10/17 (!) 148/84  05/13/17 (!) 178/98    Pulse Readings from Last 3 Encounters:  10/06/17 (!) 59  06/10/17 95  05/13/17 84  Filed Weights   10/06/17 1029  Weight: 115 lb (52.2 kg)    Lab Results  Component Value Date   HGBA1C 6.3 (A) 10/06/2017   HGBA1C 6.3 06/10/2017   HGBA1C 6.7 03/10/2017     Patient Care Team    Relationship Specialty Notifications Start End  Mellody Dance, DO PCP - General Family Medicine  01/31/16   Leandrew Koyanagi, MD Attending Physician Orthopedic Surgery  01/31/16   Hayden Pedro, MD Consulting Physician Ophthalmology  01/31/16   Calvert Cantor, MD Consulting Physician Optometry  03/18/16   Sydnee Levans, MD Referring Physician Dermatology  05/13/17      Lab Results  Component Value Date   CREATININE 0.85 03/20/2017   BUN 20 03/20/2017   NA 146 (H) 03/20/2017   K 4.3 03/20/2017   CL 103 03/20/2017   CO2 28 03/20/2017    Lab Results  Component Value Date   CHOL 162 03/20/2017   CHOL 143 03/21/2016    Lab Results  Component Value Date   HDL 55 03/20/2017   HDL 54 03/21/2016    Lab Results  Component Value Date   LDLCALC 89 03/20/2017   [place] 68 03/21/2016    Lab Results    Component Value Date   TRIG 92 03/20/2017   TRIG 104 03/21/2016    Lab Results  Component Value Date   CHOLHDL 2.9 03/20/2017   CHOLHDL 2.6 03/21/2016    No results found for: LDLDIRECT ===================================================================   Patient Active Problem List   Diagnosis Date Noted  . Blood glucose elevated (& elevated A1c) 04/02/2016    Priority: High  . HLD (hyperlipidemia) 03/18/2016    Priority: High  . HTN (hypertension) 02/17/2015    Priority: High  . Basal cell carcinoma-  R proximal central dorsal wrist 12/01/2016    Priority: Medium  . Chronic anticoagulation 03/18/2016    Priority: Medium  . h/o Atrial fibrillation ([place]), CHA2DS2-VASc Score 5-6 02/17/2015    Priority: Medium  . h/o Diastolic dysfunction with chronic heart failure ([place]) 02/17/2015    Priority: Medium  . Macular degeneration 03/18/2016    Priority: Low  . Hypothyroidism 02/17/2015    Priority: Low  . Glaucoma 02/17/2015    Priority: Low  . Iron deficiency 10/06/2017  . Vitamin D insufficiency 10/06/2017  . History of osteopenia 03/10/2017  . 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 Solar keratosis 10/07/2016  . History of skin cancer 08/15/2016  . Wound of left leg 08/15/2016  . h/o Irregular heart rhythm 03/18/2016  . Encounter for screening for diseases of the blood and blood-forming organs and certain disorders involving the immune mechanism 03/18/2016  . Counseling on health promotion and disease prevention 03/18/2016  . Hip fracture ([place]) 02/17/2015  . Closed left hip fracture ([place]) 02/17/2015  . Fall 02/17/2015  . H/O hypokalemia 02/17/2015  . Traumatic hematoma of left hand 02/17/2015  . Elevated CPK 02/17/2015     Past Medical History:  Diagnosis Date  . Atrial fibrillation ([place])   . Glaucoma 02/17/2015  . Hypokalemia   . Hypothyroidism   . Irregular heart rhythm   . Macular degeneration      Past Surgical  History:  Procedure Laterality Date  . ABDOMINAL HYSTERECTOMY    . APPENDECTOMY    . CATARACT EXTRACTION Bilateral   . INTRAMEDULLARY (IM) NAIL INTERTROCHANTERIC Left 02/19/2015   Procedure: INTRAMEDULLARY (IM) NAIL INTERTROCHANTRIC;  Surgeon: Leandrew Koyanagi, MD;  Location:  [facility];  Service: Orthopedics;  Laterality: Left;     Family History  Problem Relation Age of Onset  . Heart attack Father   . Diabetes Sister   . Cancer Brother        skin  . Cancer Son        prostate  . Cancer Brother        lung  . Cancer Brother        blood  . Cancer Brother        brain  . Heart attack Brother   . ALS Sister      Social History   Substance and Sexual Activity  Drug Use No  ,  Social History   Substance and Sexual Activity  Alcohol Use No  ,  Social History   Tobacco Use  Smoking Status Never Smoker  Smokeless Tobacco Never Used  ,    Current Outpatient Medications on File Prior to Visit  Medication Sig Dispense Refill  . acetaminophen (TYLENOL) 325 MG tablet Take 2 tablets (650 mg total) by mouth every 6 (six) hours as needed for mild pain, moderate pain, fever or headache.    . Besifloxacin HCl (BESIVANCE) 0.6 % SUSP Apply 1 drop to eye as directed.    . brimonidine (ALPHAGAN) 0.2 % ophthalmic solution Place 1 drop into both eyes 3 (three) times daily.  3  . Cholecalciferol (VITAMIN D3 SUPER STRENGTH) 2000 units CAPS Take 1 capsule by mouth daily.    Marland Kitchen docusate sodium (COLACE) 100 MG capsule Take 1 capsule (100 mg total) by mouth 2 (two) times daily. 10 capsule 0  . dorzolamide (TRUSOPT) 2 % ophthalmic solution Place 1 drop into both eyes daily.  2  . fluorouracil (EFUDEX) 5 % cream Apply topically 2 (two) times daily.    . furosemide (LASIX) 20 MG tablet TAKE 1 TABLET (20 MG TOTAL) BY MOUTH DAILY. 90 tablet 1  . levothyroxine (SYNTHROID, LEVOTHROID) 50 MCG tablet TAKE 1 TABLET BY MOUTH DAILY BEFORE BREAKFAST. 90 tablet 0  . lovastatin (MEVACOR) 20 MG tablet TAKE 1  TABLET BY MOUTH DAILY AT 6:00 PM. 90 tablet 0  . Multiple Vitamins-Minerals (PRESERVISION AREDS 2) CAPS Take 1 capsule by mouth 2 (two) times daily.    . timolol (BETIMOL) 0.5 % ophthalmic solution Place 1 drop into both eyes 2 (two) times daily.    . timolol (TIMOPTIC) 0.5 % ophthalmic solution   3   No current facility-administered medications on file prior to visit.      Allergies  Allergen Reactions  . Calcium-Containing Compounds Other (See Comments)    constipation     Review of Systems:   General:  Denies fever, chills Optho/Auditory:   Denies visual changes, blurred vision Respiratory:   Denies SOB, cough, wheeze, DIB  Cardiovascular:   Denies chest pain, palpitations, painful respirations Gastrointestinal:   Denies nausea, vomiting, diarrhea.  Endocrine:     Denies new hot or cold intolerance Musculoskeletal:  Denies joint swelling, gait issues, or new unexplained myalgias/ arthralgias Skin:  Denies rash, suspicious lesions  Neurological:    Denies dizziness, unexplained weakness, numbness  Psychiatric/Behavioral:   Denies mood changes  Objective:    Blood pressure 122/66, pulse (!) 59, height 5\' 2"  (1.575 m), weight 115 lb (52.2 kg), SpO2 97 %.  Body mass index is 21.03 kg/m.  General: Well Developed, well nourished, and in no acute distress.  HEENT: Normocephalic, atraumatic, pupils equal round reactive to light, neck supple, No carotid  bruits, no JVD Skin: Warm and dry, cap RF less 2 sec Cardiac: Regular rate and rhythm, S1, S2 WNL's, no murmur appreciated, no rubs or gallops.  No carotid bruits. Respiratory: Bibasilar rales appreciated. Not using accessory muscles, speaking in full sentences. NeuroM-Sk: Ambulates w/o assistance, moves ext * 4 w/o difficulty, sensation grossly intact.  Ext: scant edema b/l lower ext, no pitting edema. Psych: No HI/SI, judgement and insight good, Euthymic mood. Full Affect.

## 2017-10-23 ENCOUNTER — Encounter (INDEPENDENT_AMBULATORY_CARE_PROVIDER_SITE_OTHER): Payer: Medicare Other | Admitting: Ophthalmology

## 2017-10-23 DIAGNOSIS — H43813 Vitreous degeneration, bilateral: Secondary | ICD-10-CM

## 2017-10-23 DIAGNOSIS — H34832 Tributary (branch) retinal vein occlusion, left eye, with macular edema: Secondary | ICD-10-CM

## 2017-10-23 DIAGNOSIS — H35033 Hypertensive retinopathy, bilateral: Secondary | ICD-10-CM | POA: Diagnosis not present

## 2017-10-23 DIAGNOSIS — H353122 Nonexudative age-related macular degeneration, left eye, intermediate dry stage: Secondary | ICD-10-CM | POA: Diagnosis not present

## 2017-10-23 DIAGNOSIS — I10 Essential (primary) hypertension: Secondary | ICD-10-CM | POA: Diagnosis not present

## 2017-10-23 DIAGNOSIS — H353211 Exudative age-related macular degeneration, right eye, with active choroidal neovascularization: Secondary | ICD-10-CM

## 2017-10-24 ENCOUNTER — Other Ambulatory Visit: Payer: Self-pay | Admitting: Family Medicine

## 2017-10-24 DIAGNOSIS — I5032 Chronic diastolic (congestive) heart failure: Secondary | ICD-10-CM

## 2017-11-25 ENCOUNTER — Telehealth: Payer: Self-pay | Admitting: Family Medicine

## 2017-11-25 NOTE — Telephone Encounter (Signed)
[name] (patient's daughter) states that patient needs a new refill request for her tyroid meds sent to [facility]. [name] knows that its early for the refill request but went to patient's house and noticed she was down to only 6 pills and questioned patient why she ran out early, it is thought that she was taking too many pill (patient would fall asleep during the day and wake up thinking its another day and would possibly take an additonal pill from time to time). If you need any additional information [name] can be reached at [phone].

## 2017-11-26 ENCOUNTER — Other Ambulatory Visit: Payer: Self-pay

## 2017-11-26 MED ORDER — LEVOTHYROXINE SODIUM 50 MCG PO TABS: ORAL_TABLET | ORAL | 0 refills | Status: DC

## 2017-11-26 NOTE — Telephone Encounter (Signed)
Refill sent into pharmacy and patient notified. MPulliam, CMA/RT(R)

## 2017-11-26 NOTE — Telephone Encounter (Signed)
Refill on levothyroxine. MPulliam, CMA/RT(R)

## 2017-11-27 ENCOUNTER — Encounter (INDEPENDENT_AMBULATORY_CARE_PROVIDER_SITE_OTHER): Payer: Medicare Other | Admitting: Ophthalmology

## 2017-11-27 DIAGNOSIS — H353211 Exudative age-related macular degeneration, right eye, with active choroidal neovascularization: Secondary | ICD-10-CM | POA: Diagnosis not present

## 2017-11-27 DIAGNOSIS — H34832 Tributary (branch) retinal vein occlusion, left eye, with macular edema: Secondary | ICD-10-CM

## 2017-11-27 DIAGNOSIS — H353122 Nonexudative age-related macular degeneration, left eye, intermediate dry stage: Secondary | ICD-10-CM

## 2017-11-27 DIAGNOSIS — H35033 Hypertensive retinopathy, bilateral: Secondary | ICD-10-CM | POA: Diagnosis not present

## 2017-11-27 DIAGNOSIS — I10 Essential (primary) hypertension: Secondary | ICD-10-CM

## 2017-11-27 DIAGNOSIS — H4313 Vitreous hemorrhage, bilateral: Secondary | ICD-10-CM

## 2017-12-16 ENCOUNTER — Other Ambulatory Visit: Payer: Self-pay | Admitting: Family Medicine

## 2017-12-25 ENCOUNTER — Encounter (INDEPENDENT_AMBULATORY_CARE_PROVIDER_SITE_OTHER): Payer: Medicare Other | Admitting: Ophthalmology

## 2017-12-25 DIAGNOSIS — H35033 Hypertensive retinopathy, bilateral: Secondary | ICD-10-CM

## 2017-12-25 DIAGNOSIS — H34832 Tributary (branch) retinal vein occlusion, left eye, with macular edema: Secondary | ICD-10-CM

## 2017-12-25 DIAGNOSIS — I10 Essential (primary) hypertension: Secondary | ICD-10-CM

## 2017-12-25 DIAGNOSIS — H43813 Vitreous degeneration, bilateral: Secondary | ICD-10-CM

## 2017-12-25 DIAGNOSIS — H353122 Nonexudative age-related macular degeneration, left eye, intermediate dry stage: Secondary | ICD-10-CM

## 2017-12-25 DIAGNOSIS — H353211 Exudative age-related macular degeneration, right eye, with active choroidal neovascularization: Secondary | ICD-10-CM

## 2017-12-30 DIAGNOSIS — H34832 Tributary (branch) retinal vein occlusion, left eye, with macular edema: Secondary | ICD-10-CM | POA: Diagnosis not present

## 2017-12-30 DIAGNOSIS — Z961 Presence of intraocular lens: Secondary | ICD-10-CM | POA: Diagnosis not present

## 2017-12-30 DIAGNOSIS — H401133 Primary open-angle glaucoma, bilateral, severe stage: Secondary | ICD-10-CM | POA: Diagnosis not present

## 2017-12-30 DIAGNOSIS — H353212 Exudative age-related macular degeneration, right eye, with inactive choroidal neovascularization: Secondary | ICD-10-CM | POA: Diagnosis not present

## 2018-01-08 ENCOUNTER — Encounter: Payer: Self-pay | Admitting: Family Medicine

## 2018-01-08 ENCOUNTER — Ambulatory Visit: Payer: Medicare Other | Admitting: Family Medicine

## 2018-01-08 VITALS — BP 158/94 | HR 96 | Ht 62.0 in | Wt 110.0 lb

## 2018-01-08 DIAGNOSIS — E559 Vitamin D deficiency, unspecified: Secondary | ICD-10-CM

## 2018-01-08 DIAGNOSIS — E611 Iron deficiency: Secondary | ICD-10-CM

## 2018-01-08 DIAGNOSIS — E876 Hypokalemia: Secondary | ICD-10-CM

## 2018-01-08 DIAGNOSIS — R739 Hyperglycemia, unspecified: Secondary | ICD-10-CM

## 2018-01-08 DIAGNOSIS — R2689 Other abnormalities of gait and mobility: Secondary | ICD-10-CM

## 2018-01-08 DIAGNOSIS — I10 Essential (primary) hypertension: Secondary | ICD-10-CM

## 2018-01-08 DIAGNOSIS — E039 Hypothyroidism, unspecified: Secondary | ICD-10-CM

## 2018-01-08 DIAGNOSIS — E785 Hyperlipidemia, unspecified: Secondary | ICD-10-CM

## 2018-01-08 DIAGNOSIS — R531 Weakness: Secondary | ICD-10-CM

## 2018-01-08 DIAGNOSIS — Z7409 Other reduced mobility: Secondary | ICD-10-CM

## 2018-01-08 DIAGNOSIS — Z742 Need for assistance at home and no other household member able to render care: Secondary | ICD-10-CM

## 2018-01-08 LAB — POCT GLUCOSE (DEVICE FOR HOME USE): GLUCOSE FASTING, POC: 126 mg/dL — AB (ref 70–99)

## 2018-01-08 NOTE — Progress Notes (Addendum)
Impression and Recommendations:    1. Hypothyroidism, unspecified type   2. Hyperlipidemia, unspecified hyperlipidemia type   3. Blood glucose elevated (& elevated A1c)   4. H/O hypokalemia   5. Vitamin D insufficiency   6. Iron deficiency   7. Essential hypertension   8. Need for home health care   9. Weakness generalized   10. Balance problem   11. Decreased transfer ability    HTN -Discussed healthy ranges for bp with advanced age -Discussed symptoms associated with low bp -Encouraged daughter to watch out for low bp due to possible falls and negative symptoms -Discussed reasonable changes in bp due to resting and moving around -Instructed pt to stand slowly and wait a moment to regain her balance before moving  Mobility -Referred an at-home physical therapist to help with strengthening exercises -Pt promised to move in with her daughter over the next few months -Strongly encouraged pt to move in with her daughter for her own safety -Set goal of moving for at least 5 minutes out of each hour -Instructed pt to use a walker when she is not at home to improve stability -Discussed safe ways to stand up and move with safety -Encouraged pt to pause after standing for her bp to even out -Encouraged pt to increase her exercise habits in order to prevent increasing weakness -Instructed pt to practice standing up and sitting down in order strengthen her muscles -Discussed serious safety concerns with pt and daughter about pt's difficulty movement -Discussed ways to improve strength at home such as doing workout programs on tv or the computer, and practicing exercises in her free time -Educated pt that there has been a significant change in pt's strength and without changes, she will see a major decline in mobility    Eyes -Pt is being treated by a glaucoma specialist; will defer for treatment  Pt was in the office today for 32.5+ minutes, with over 50% time spent in face to  face counseling of patients various medical conditions, treatment plans of those medical conditions including medicine management and lifestyle modification, strategies to improve health and well being; and in coordination of care. SEE ABOVE TREATMENT PLAN FOR DETAILS    Education and routine counseling performed. Handouts provided.  Orders Placed This Encounter  Procedures  . CBC with Differential/Platelet  . Comprehensive metabolic panel  . Hemoglobin A1c  . TSH  . VITAMIN D 25 Hydroxy (Vit-D Deficiency, Fractures)  . Vitamin B12  . Folate  . Iron and TIBC  . Ferritin  . Ambulatory referral to Home Health  . POCT Glucose (Device for Home Use)    Medications Discontinued During This Encounter  Medication Reason  . fluorouracil (EFUDEX) 5 % cream Completed Course      The patient was counseled, risk factors were discussed, anticipatory guidance given.   Gross side effects, risk and benefits, and alternatives of medications discussed with patient.  Patient is aware that all medications have potential side effects and we are unable to predict every side effect or drug-drug interaction that may occur.  Expresses verbal understanding and consents to current therapy plan and treatment regimen.   Return for 28mo f/up chronic conditions- sooner if concerns.   Please see AVS handed out to patient at the end of our visit for further patient instructions/ counseling done pertaining to today's office visit.    Note:  This document was prepared using Dragon voice recognition software and may include unintentional dictation errors.  This document serves as a record of services personally performed by Mellody Dance, MD. It was created on her behalf by Georga Bora, a trained medical scribe. The creation of this record is based on the scribe's personal observations and the provider's statements to them.   I have reviewed the above medical documentation for accuracy and completeness and  I concur.  Mellody Dance 01/28/18 1:19 PM     Subjective:    Chief Complaint  Patient presents with  . Follow-up     Brianna Thompson is a 84 y.o. female who presents to [place] at [facility] today for Diabetes Management.    Daughter-in-law is present as additional historian -Daughter visits once each day around 11am-12pm -Sets aside her medicines for the day, cooks her food for the day, and spends time with her -Daughter relayed all information unless noted otherwise  Eyes -States she has glaucoma and macular degeneration -Previous eye doctor has directed her to see a glaucoma specialist because he can no longer help her -Previous eye doctor also has stated she may lose her vision in the near future and her daughter needs to prepare to move her home -She is concerned about her mother staying alone due to her declining vision and wants to move her mother in with her so she can become used to the home layout   HTN HPI: -  Her blood pressure has been controlled at home.  -Pt is checking her bp at home averaging around 120s-140s/70s-91 -Pt states she is not experiencing symptoms of low bp even during low numbers   - Patient reports good compliance with blood pressure medications - Denies medication S-E - Smoking Status noted   - She denies new onset of: chest pain, exercise intolerance, shortness of breath, dizziness, visual changes, headache, lower extremity swelling or claudication.   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" but she knows what to lean on and how to safely move through her home -Is concerned about moving her mother because she  doesn't want to disrupt how her mother moves and risk her falling down -Says she and her son (a physical therapy assistant) have tried to motivate her mother to do exercises at home and while watching tv -Wants to help her mother regain mobility and move to into the daughter's house in order to continue helping her mother build strength and exercise -Says she calls her mother and she's asleep at 5 or 6 in the evening -States she doesn't move much and is often at home "sitting in her chair" sometimes without even the TV on   -Pt states she believes she her body is getting weaker and needs to work on building strength -Pt states she would rather move in with her daughter than into a nursing home  Filed Weights   01/08/18 1040  Weight: 110 lb (49.9 kg)     Last 3 blood pressure readings in our office are as follows: BP Readings from Last 3 Encounters:  01/15/18 (!) 169/96  01/08/18 (!) 158/94  10/06/17 122/66    BMI Readings from Last 3 Encounters:  01/13/18 20.12 kg/m  01/08/18 20.12 kg/m  10/06/17 21.03 kg/m     No problems updated.    Patient Care Team    Relationship Specialty Notifications Start End  Mellody Dance, DO PCP - General Family Medicine  01/31/16   Leandrew Koyanagi, MD Attending Physician Orthopedic Surgery  01/31/16   Hayden Pedro, MD Consulting Physician Ophthalmology  01/31/16   Calvert Cantor, MD Consulting Physician Optometry  03/18/16   Sydnee Levans, MD Referring Physician Dermatology  05/13/17      Patient Active Problem List   Diagnosis Date Noted  . Blood glucose elevated (& elevated A1c) 04/02/2016    Priority: High  . HLD (hyperlipidemia) 03/18/2016    Priority: High  . HTN (hypertension) 02/17/2015    Priority: High  . Basal cell carcinoma-  R proximal central dorsal wrist 12/01/2016    Priority: Medium  . Chronic anticoagulation 03/18/2016    Priority: Medium  . h/o Atrial fibrillation ([place]), CHA2DS2-VASc Score 5-6 02/17/2015     Priority: Medium  . h/o Diastolic dysfunction with chronic heart failure ([place]) 02/17/2015    Priority: Medium  . Macular degeneration 03/18/2016    Priority: Low  . Hypothyroidism 02/17/2015    Priority: Low  . Glaucoma 02/17/2015    Priority: Low  . Malnutrition of moderate degree 01/14/2018  . Iron deficiency 10/06/2017  . Vitamin D insufficiency 10/06/2017  . History of osteopenia 03/10/2017  . 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 Solar keratosis 10/07/2016  . History of skin cancer 08/15/2016  . Wound of left leg 08/15/2016  . h/o Irregular heart rhythm 03/18/2016  . Encounter for screening for diseases of the blood and blood-forming organs and certain disorders involving the immune mechanism 03/18/2016  . Counseling on health promotion and disease prevention 03/18/2016  . Hip fracture ([place]) 02/17/2015  . Closed left hip fracture ([place]) 02/17/2015  . Fall 02/17/2015  . H/O hypokalemia 02/17/2015  . Traumatic hematoma of left hand 02/17/2015  . Elevated CPK 02/17/2015     Past Medical History:  Diagnosis Date  . Atrial fibrillation ([place])   . Glaucoma 02/17/2015  . Hypokalemia   . Hypothyroidism   . Irregular heart rhythm   . Macular degeneration      Past Surgical History:  Procedure Laterality Date  . ABDOMINAL HYSTERECTOMY    . APPENDECTOMY    . CATARACT EXTRACTION Bilateral   . INTRAMEDULLARY (IM) NAIL INTERTROCHANTERIC Left 02/19/2015   Procedure: INTRAMEDULLARY (IM) NAIL INTERTROCHANTRIC;  Surgeon: Leandrew Koyanagi, MD;  Location: [facility];  Service: Orthopedics;  Laterality: Left;     Family History  Problem Relation Age of Onset  . Heart attack Father   . Diabetes Sister   . Cancer Brother        skin  . Cancer Son        prostate  . Cancer Brother        lung  . Cancer Brother        blood  . Cancer Brother        brain  . Heart attack Brother   . ALS Sister      Social History   Substance and Sexual  Activity  Drug Use No  ,  Social History   Substance and Sexual Activity  Alcohol Use No  ,  Social History   Tobacco Use  Smoking Status Never Smoker  Smokeless Tobacco Never Used  ,    Current Outpatient Medications on File Prior to Visit  Medication Sig Dispense Refill  . acetaminophen (TYLENOL) 325 MG tablet Take 2 tablets (650 mg total) by mouth every 6 (six) hours as needed for mild pain, moderate pain, fever or headache.    . Besifloxacin HCl (BESIVANCE) 0.6 % SUSP Place 1 drop into the left eye every 30 (thirty) days.     . brimonidine (ALPHAGAN) 0.2 % ophthalmic solution Place 1 drop into both eyes 3 (three) times daily.  3  . Cholecalciferol (VITAMIN D3 SUPER STRENGTH) 2000 units CAPS Take 2,000 Units by mouth daily.     Marland Kitchen docusate sodium (COLACE) 100 MG capsule Take 1 capsule (100 mg total) by mouth 2 (two) times daily. 10 capsule 0  . dorzolamide (TRUSOPT) 2 % ophthalmic solution Place 1 drop into both eyes daily.  2  . furosemide (LASIX) 20 MG tablet TAKE 1 TABLET (20 MG TOTAL) BY MOUTH DAILY. 90 tablet 1  . levothyroxine (SYNTHROID, LEVOTHROID) 50 MCG tablet TAKE 1 TABLET BY MOUTH DAILY BEFORE BREAKFAST. (Patient taking differently: Take 50 mcg by mouth daily before breakfast. ) 90 tablet 0  . metoprolol tartrate (LOPRESSOR) 100 MG tablet Take 1 tablet (100 mg total) by mouth daily. 90 tablet 1  . Multiple Vitamins-Minerals (PRESERVISION AREDS 2) CAPS Take 1 capsule by mouth 2 (two) times daily.    . potassium chloride (K-DUR) 10 MEQ tablet Take 1 tablet (10 mEq total) by mouth daily. 180 tablet 0  . Rivaroxaban (XARELTO) 15 MG TABS tablet TAKE 1 TABLET BY MOUTH DAILY WITH SUPPER (Patient taking differently: Take 15 mg by mouth daily with supper. ) 90 tablet 1  . timolol (BETIMOL) 0.5 % ophthalmic solution Place 1 drop into both eyes 2 (two) times daily.     No current facility-administered medications on file prior to visit.      Allergies  Allergen Reactions  .  Calcium-Containing Compounds Other (See Comments)    constipation     Review of Systems:   General:  Denies fever, chills Optho/Auditory:   Denies visual changes, blurred vision Respiratory:   Denies SOB, cough, wheeze, DIB  Cardiovascular:   Denies chest pain, palpitations, painful respirations Gastrointestinal:   Denies nausea, vomiting, diarrhea.  Endocrine:     Denies new hot or cold intolerance Musculoskeletal:  Denies joint swelling, gait issues, or new unexplained myalgias/ arthralgias Skin:  Denies rash, suspicious lesions  Neurological:    Denies dizziness, unexplained weakness, numbness  Psychiatric/Behavioral:   Denies mood changes    Objective:     Blood pressure (!) 158/94, pulse 96, height 5\' 2"  (1.575 m), weight 110 lb (49.9 kg), SpO2 97 %.  Body mass index is 20.12 kg/m.  General: Well Developed, well nourished, and in no acute distress.  HEENT: Normocephalic, atraumatic, pupils equal round reactive to light, neck supple, No carotid bruits, no JVD Skin: Warm and dry, cap RF less 2 sec Cardiac: irregularly irregular, S1, S2 WNL's, no murmurs rubs or gallops Respiratory: ECTA B/L, Not using accessory muscles, speaking in full sentences, decreased breath sounds NeuroM-Sk: Ambulates w/o assistance, moves ext * 4 w/o difficulty, sensation grossly intact.  Ext: scant edema b/l lower ext Psych: No HI/SI, judgement and insight good, Euthymic mood. Full Affect.

## 2018-01-08 NOTE — Patient Instructions (Addendum)
We will be sending home health to come in and evaluate your home surroundings, make sure you are safe and have them make any recommendations that may make your environment safer for you as well as to have physical therapy work with you on strength, balance, mobility- transferring from chair to walking etc.  Also Brianna Thompson, as we discussed is extremely important over the next month or 2 that you transition to living with Brianna Thompson your daughter-in-law.  I do not think you are safe to live at home alone for much longer due to your visual disabilities as well as loss of strength and mobility\balance etc.     How to Use a Walker How to walk with a walker The best way to walk with a walker depends on whether you are using a standard walker or a front-wheeled walker. A standard walker has rubber tips on the ends of all four legs. A front-wheeled walker has wheels on the ends of the front legs and rubber tips on the ends of the back legs.  Do not use your walker on stairs or an escalator unless you have been trained by a physical therapist or unless your health care provider approves.  To Walk With a Standard Walker:  1. Pick up your walker. Do not slide your standard walker. 2. Set down your walker, one step-length in front of you. Make sure that all four legs of the walker touch the ground at the same time. Your toes should be farther forward than the back legs of your walker. 3. Hold on to the walker for support, and step your weaker leg into the middle of the walker. 4. Step your stronger leg forward to land next to your weaker leg. 5. Repeat this process for each step. To Walk With a Front-Wheeled Walker: 1. Slide your front-wheeled walker one step-length in front of you. Your toes should be farther forward than the back legs of your walker. 2. Hold on to the walker for support, and step your weaker leg into the middle of the walker. 3. Step your stronger leg forward to land next to your weaker  leg. 4. Repeat the process for each step. Tips  Always keep both feet within the width of the walker's legs or wheels.  When using your walker, you should not feel like you need to lean forward or to the side to keep your hands on the handgrips.  Make sure you are following any weight-bearing instructions that your health care provider has given you.  If you have a standard walker: ? Do not slide your walker when you are moving.  If you have a front-wheeled walker: ? Be careful not to let the walker get too far ahead of you as you walk. ? If your walker does not glide well over carpet, consider cutting an "X" into two tennis balls and placing the balls over the back legs of your walker. How to stand up with a walker 1. Put your walker in front of you. 2. Slide forward in your chair. 3. Position your legs so that your weaker leg is ahead of you and your stronger leg is bent and near your chair. 4. Position your hands. ? If your chair has armrests, put each hand on an armrest. ? If there are no armrests, put the hand opposite your weaker leg on the chair seat, and put the other hand on the center of the walker's crossbar. 5. Lean forward and push up from your chair. 6.  Rise by straightening your stronger leg. 7. Steady yourself. 8. Carefully move your hands to the handgrips of the walker. Tips  Do not pull on the walker when you stand up. This may cause it to tip.  Sit in a firm chair whenever you can. A low seat or an overstuffed chair or sofa is hard to get out of. How to sit down with a walker To Sit Down in a Seat That Has Armrests:  1. Back up toward your seat, using your walker, until you feel the back of your legs touch the chair. 2. Carefully reach your hands behind you and put each hand on an armrest. 3. Slowly lower yourself into the seat. To Sit Down in a Seat Without Armrests: 1. Back up toward the side of the seat, using your walker, until you feel the back of your  legs touch the chair. 2. Use one hand to hold on to the back of the chair, and use the other hand to hold on to the front of the seat. 3. Slowly lower yourself into the seat. How to use a walker on a curb or step To Use a Walker to Step Up: 1. Put all four legs of the walker on the curb or step. 2. Get your feet as close to the curb or step as you can. 3. Test the steadiness of the walker by pressing down on the handgrips. 4. If the walker is steady, press down on it with your hands as you step up with your stronger leg. 5. Step up with your weaker leg. To Use a Walker to Step Down : 1. Put all four legs of the walker on the surface that is lower than the curb or step. 2. Get your feet as close to the curb or step as you can. 3. Test the steadiness of the walker by pressing down on the handgrips. 4. If the walker is steady, press down on it with your hands as you step down with your weaker leg. 5. Step down with your stronger leg. This information is not intended to replace advice given to you by your health care provider. Make sure you discuss any questions you have with your health care provider. Document Released: 04/01/2005 Document Revised: 08/30/2015 Document Reviewed: 10/14/2014 Elsevier Interactive Patient Education  2018 [place] [employer] Age-related macular degeneration (AMD) is an eye disease related to aging. The disease causes you to lose your central vision, which is the part of your vision that allows you to see objects clearly and do daily tasks like reading and driving. There are two main types of AMD:  Dry AMD. People with this type lose their vision slowly. This is the most common type of AMD. Some people with dry AMD notice very little change in their vision as they age.  Wet AMD. People with this type lose their vision quickly.  What are the causes? This condition is caused by damage to the part of the eye that provides you with  central vision (macula). Dry AMD happens when deposits in the macula cause light-sensitive cells to slowly break down. Wet AMD happens when abnormal blood vessels grow under the macula and leak blood and fluid. What increases the risk? The following factors may make you more likely to develop this condition:  Being 26 years old or older.  Smoking.  Being obese.  Having a family history of AMD.  Having high cholesterol, high blood pressure, or heart disease.  Having been exposed to high levels of ultraviolet (UV) light and blue light.  Being white (Caucasian).  Being female.  What are the signs or symptoms? Symptoms of this condition include:  Blurred vision, especially when reading print material. The blurred vision often goes away in brighter light.  A blurred or blind spot in the center of your field of vision that is small but growing larger.  Bright colors seeming less bright than they used to be.  Decreased ability to recognize faces.  One eye seeming worse than the other.  Decreased ability to adapt to dimly lit rooms.  Straight lines appearing crooked or wavy.  How is this diagnosed? This condition is diagnosed based on your symptoms and an eye exam. During the eye exam, eye drops will be placed in your eyes to enlarge (dilate) your pupils. This will allow your health care provider to see the back of your eye better. You may be asked to look at an image that looks like a checkerboard (Amsler grid). Early changes in your central vision may cause the grid to look distorted. After the exam, you may be given a test called a fluorescein angiogram. This test determines whether you have dry or wet AMD. During this test, dye is injected into a vein and pictures are taken as the dye travels through your eye. How is this treated? There is no cure for this condition, but treatment can help slow down how fast it gets worse. Treatment may include:  Supplements, including vitamin  C, vitamin E, beta carotene, and zinc.  Laser surgery to destroy new blood vessels or leaking blood vessels in your eye.  Injections of medicines into your eye to slow down the formation of abnormal blood vessels that might leak. These injections are given by a health care provider.  Follow these instructions at home:  Get an eye exam as often told by your health care provider. Make sure to get an eye exam at least once every year.  Take vitamins and supplements as told by your health care provider.  Take over-the-counter and prescription medicines only as told by your health care provider.  Ask your health care provider for an Amsler grid, and use it every day to check each eye for vision changes.  Keep all follow-up visits as told by your health care provider. This is important. Contact a health care provider if:  You notice any new changes in your vision. This information is not intended to replace advice given to you by your health care provider. Make sure you discuss any questions you have with your health care provider. Document Released: 07/09/2007 Document Revised: 09/07/2015 Document Reviewed: 01/26/2015 Elsevier Interactive Patient Education  [employer].

## 2018-01-09 LAB — COMPREHENSIVE METABOLIC PANEL
ALT: 20 IU/L (ref 0–32)
AST: 26 IU/L (ref 0–40)
Albumin/Globulin Ratio: 2.2 (ref 1.2–2.2)
Albumin: 5 g/dL — ABNORMAL HIGH (ref 3.2–4.6)
Alkaline Phosphatase: 96 IU/L (ref 39–117)
BILIRUBIN TOTAL: 0.6 mg/dL (ref 0.0–1.2)
BUN / CREAT RATIO: 18 (ref 12–28)
BUN: 20 mg/dL (ref 10–36)
CO2: 26 mmol/L (ref 20–29)
CREATININE: 1.1 mg/dL — AB (ref 0.57–1.00)
Calcium: 11 mg/dL — ABNORMAL HIGH (ref 8.7–10.3)
Chloride: 101 mmol/L (ref 96–106)
GFR calc Af Amer: 50 mL/min/{1.73_m2} — ABNORMAL LOW (ref >59)
GFR, EST NON AFRICAN AMERICAN: 43 mL/min/{1.73_m2} — AB (ref >59)
GLUCOSE: 118 mg/dL — AB (ref 65–99)
Globulin, Total: 2.3 g/dL (ref 1.5–4.5)
Potassium: 4.2 mmol/L (ref 3.5–5.2)
Sodium: 145 mmol/L — ABNORMAL HIGH (ref 134–144)
Total Protein: 7.3 g/dL (ref 6.0–8.5)

## 2018-01-09 LAB — HEMOGLOBIN A1C
Est. average glucose Bld gHb Est-mCnc: 137 mg/dL
Hgb A1c MFr Bld: 6.4 % — ABNORMAL HIGH (ref 4.8–5.6)

## 2018-01-09 LAB — IRON AND TIBC
IRON SATURATION: 34 % (ref 15–55)
IRON: 94 ug/dL (ref 27–139)
TIBC: 273 ug/dL (ref 250–450)
UIBC: 179 ug/dL (ref 118–369)

## 2018-01-09 LAB — CBC WITH DIFFERENTIAL/PLATELET
Basophils Absolute: 0.1 10*3/uL (ref 0.0–0.2)
Basos: 1 %
EOS (ABSOLUTE): 0 10*3/uL (ref 0.0–0.4)
EOS: 1 %
Hematocrit: 35.2 % (ref 34.0–46.6)
Hemoglobin: 13.2 g/dL (ref 11.1–15.9)
Immature Grans (Abs): 0 10*3/uL (ref 0.0–0.1)
Immature Granulocytes: 0 %
LYMPHS ABS: 1.8 10*3/uL (ref 0.7–3.1)
Lymphs: 32 %
MCH: 37.7 pg — AB (ref 26.6–33.0)
MCHC: 37.5 g/dL — ABNORMAL HIGH (ref 31.5–35.7)
MCV: 101 fL — ABNORMAL HIGH (ref 79–97)
Monocytes Absolute: 0.4 10*3/uL (ref 0.1–0.9)
Monocytes: 8 %
Neutrophils Absolute: 3.3 10*3/uL (ref 1.4–7.0)
Neutrophils: 58 %
Platelets: 253 10*3/uL (ref 150–450)
RBC: 3.5 x10E6/uL — AB (ref 3.77–5.28)
RDW: 13 % (ref 12.3–15.4)
WBC: 5.7 10*3/uL (ref 3.4–10.8)

## 2018-01-09 LAB — VITAMIN B12: Vitamin B-12: 834 pg/mL (ref 232–1245)

## 2018-01-09 LAB — FOLATE: Folate: >20 ng/mL (ref >3.0)

## 2018-01-09 LAB — TSH: TSH: 0.404 u[IU]/mL — ABNORMAL LOW (ref 0.450–4.500)

## 2018-01-09 LAB — FERRITIN: FERRITIN: 91 ng/mL (ref 15–150)

## 2018-01-09 LAB — VITAMIN D 25 HYDROXY (VIT D DEFICIENCY, FRACTURES): VIT D 25 HYDROXY: 80.7 ng/mL (ref 30.0–100.0)

## 2018-01-13 ENCOUNTER — Emergency Department (HOSPITAL_COMMUNITY): Payer: Medicare Other

## 2018-01-13 ENCOUNTER — Observation Stay (HOSPITAL_COMMUNITY)
Admission: EM | Admit: 2018-01-13 | Discharge: 2018-01-15 | Disposition: A | Discharge status: Skilled Nursing Facility | Payer: Medicare Other | Attending: Internal Medicine | Admitting: Internal Medicine

## 2018-01-13 ENCOUNTER — Encounter (HOSPITAL_COMMUNITY): Payer: Self-pay

## 2018-01-13 DIAGNOSIS — W19XXXA Unspecified fall, initial encounter: Secondary | ICD-10-CM | POA: Diagnosis present

## 2018-01-13 DIAGNOSIS — I700 Atherosclerosis of aorta: Secondary | ICD-10-CM | POA: Diagnosis not present

## 2018-01-13 DIAGNOSIS — E785 Hyperlipidemia, unspecified: Secondary | ICD-10-CM | POA: Insufficient documentation

## 2018-01-13 DIAGNOSIS — Z85828 Personal history of other malignant neoplasm of skin: Secondary | ICD-10-CM | POA: Diagnosis not present

## 2018-01-13 DIAGNOSIS — K5900 Constipation, unspecified: Secondary | ICD-10-CM | POA: Insufficient documentation

## 2018-01-13 DIAGNOSIS — R748 Abnormal levels of other serum enzymes: Secondary | ICD-10-CM | POA: Diagnosis present

## 2018-01-13 DIAGNOSIS — R2689 Other abnormalities of gait and mobility: Secondary | ICD-10-CM | POA: Diagnosis not present

## 2018-01-13 DIAGNOSIS — M8580 Other specified disorders of bone density and structure, unspecified site: Secondary | ICD-10-CM | POA: Insufficient documentation

## 2018-01-13 DIAGNOSIS — F0390 Unspecified dementia without behavioral disturbance: Secondary | ICD-10-CM | POA: Diagnosis not present

## 2018-01-13 DIAGNOSIS — Z7901 Long term (current) use of anticoagulants: Secondary | ICD-10-CM | POA: Diagnosis not present

## 2018-01-13 DIAGNOSIS — E860 Dehydration: Secondary | ICD-10-CM | POA: Diagnosis present

## 2018-01-13 DIAGNOSIS — Z66 Do not resuscitate: Secondary | ICD-10-CM | POA: Diagnosis not present

## 2018-01-13 DIAGNOSIS — Z6820 Body mass index (BMI) 20.0-20.9, adult: Secondary | ICD-10-CM | POA: Diagnosis not present

## 2018-01-13 DIAGNOSIS — E440 Moderate protein-calorie malnutrition: Secondary | ICD-10-CM

## 2018-01-13 DIAGNOSIS — Z9181 History of falling: Secondary | ICD-10-CM | POA: Diagnosis not present

## 2018-01-13 DIAGNOSIS — I4891 Unspecified atrial fibrillation: Secondary | ICD-10-CM | POA: Diagnosis not present

## 2018-01-13 DIAGNOSIS — S51011A Laceration without foreign body of right elbow, initial encounter: Secondary | ICD-10-CM | POA: Diagnosis not present

## 2018-01-13 DIAGNOSIS — H409 Unspecified glaucoma: Secondary | ICD-10-CM | POA: Diagnosis not present

## 2018-01-13 DIAGNOSIS — S199XXA Unspecified injury of neck, initial encounter: Secondary | ICD-10-CM | POA: Diagnosis not present

## 2018-01-13 DIAGNOSIS — D72829 Elevated white blood cell count, unspecified: Secondary | ICD-10-CM | POA: Diagnosis not present

## 2018-01-13 DIAGNOSIS — Z888 Allergy status to other drugs, medicaments and biological substances status: Secondary | ICD-10-CM | POA: Diagnosis not present

## 2018-01-13 DIAGNOSIS — M6282 Rhabdomyolysis: Principal | ICD-10-CM | POA: Insufficient documentation

## 2018-01-13 DIAGNOSIS — S299XXA Unspecified injury of thorax, initial encounter: Secondary | ICD-10-CM | POA: Diagnosis not present

## 2018-01-13 DIAGNOSIS — G9340 Encephalopathy, unspecified: Secondary | ICD-10-CM | POA: Insufficient documentation

## 2018-01-13 DIAGNOSIS — Z8249 Family history of ischemic heart disease and other diseases of the circulatory system: Secondary | ICD-10-CM | POA: Insufficient documentation

## 2018-01-13 DIAGNOSIS — D7589 Other specified diseases of blood and blood-forming organs: Secondary | ICD-10-CM | POA: Insufficient documentation

## 2018-01-13 DIAGNOSIS — I10 Essential (primary) hypertension: Secondary | ICD-10-CM | POA: Diagnosis not present

## 2018-01-13 DIAGNOSIS — E039 Hypothyroidism, unspecified: Secondary | ICD-10-CM | POA: Diagnosis present

## 2018-01-13 DIAGNOSIS — Z79899 Other long term (current) drug therapy: Secondary | ICD-10-CM | POA: Insufficient documentation

## 2018-01-13 DIAGNOSIS — S0990XA Unspecified injury of head, initial encounter: Secondary | ICD-10-CM | POA: Diagnosis not present

## 2018-01-13 DIAGNOSIS — E559 Vitamin D deficiency, unspecified: Secondary | ICD-10-CM | POA: Diagnosis not present

## 2018-01-13 LAB — COMPREHENSIVE METABOLIC PANEL
ALK PHOS: 88 U/L (ref 38–126)
ALT: 34 U/L (ref 0–44)
AST: 55 U/L — AB (ref 15–41)
Albumin: 4.4 g/dL (ref 3.5–5.0)
Anion gap: 9 (ref 5–15)
BUN: 21 mg/dL (ref 8–23)
CO2: 29 mmol/L (ref 22–32)
CREATININE: 0.95 mg/dL (ref 0.44–1.00)
Calcium: 10.8 mg/dL — ABNORMAL HIGH (ref 8.9–10.3)
Chloride: 102 mmol/L (ref 98–111)
GFR calc Af Amer: 58 mL/min — ABNORMAL LOW (ref >60)
GFR, EST NON AFRICAN AMERICAN: 50 mL/min — AB (ref >60)
Glucose, Bld: 142 mg/dL — ABNORMAL HIGH (ref 70–99)
Potassium: 3.4 mmol/L — ABNORMAL LOW (ref 3.5–5.1)
SODIUM: 140 mmol/L (ref 135–145)
Total Bilirubin: 1.1 mg/dL (ref 0.3–1.2)
Total Protein: 7.6 g/dL (ref 6.5–8.1)

## 2018-01-13 LAB — CBC WITH DIFFERENTIAL/PLATELET
ABS IMMATURE GRANULOCYTES: 0.1 10*3/uL (ref 0.0–0.1)
BASOS PCT: 0 %
Basophils Absolute: 0 10*3/uL (ref 0.0–0.1)
Eosinophils Absolute: 0 10*3/uL (ref 0.0–0.7)
Eosinophils Relative: 0 %
HCT: 39.2 % (ref 36.0–46.0)
HEMOGLOBIN: 13.6 g/dL (ref 12.0–15.0)
IMMATURE GRANULOCYTES: 1 %
LYMPHS PCT: 10 %
Lymphs Abs: 1.3 10*3/uL (ref 0.7–4.0)
MCH: 36.9 pg — ABNORMAL HIGH (ref 26.0–34.0)
MCHC: 34.7 g/dL (ref 30.0–36.0)
MCV: 106.2 fL — AB (ref 78.0–100.0)
Monocytes Absolute: 0.8 10*3/uL (ref 0.1–1.0)
Monocytes Relative: 7 %
NEUTROS ABS: 9.8 10*3/uL — AB (ref 1.7–7.7)
NEUTROS PCT: 82 %
PLATELETS: 229 10*3/uL (ref 150–400)
RBC: 3.69 MIL/uL — ABNORMAL LOW (ref 3.87–5.11)
RDW: 13.7 % (ref 11.5–15.5)
WBC: 12 10*3/uL — ABNORMAL HIGH (ref 4.0–10.5)

## 2018-01-13 LAB — URINALYSIS, ROUTINE W REFLEX MICROSCOPIC
BACTERIA UA: NONE SEEN
Bilirubin Urine: NEGATIVE
Glucose, UA: NEGATIVE mg/dL
Ketones, ur: 5 mg/dL — AB
Leukocytes, UA: NEGATIVE
Nitrite: NEGATIVE
PH: 9 — AB (ref 5.0–8.0)
Protein, ur: 30 mg/dL — AB
SPECIFIC GRAVITY, URINE: 1.009 (ref 1.005–1.030)

## 2018-01-13 LAB — CK: Total CK: 1074 U/L — ABNORMAL HIGH (ref 38–234)

## 2018-01-13 MED ORDER — PRESERVISION AREDS 2 PO CAPS: 1.0000 | ORAL_CAPSULE | Freq: Two times a day (BID) | ORAL | Status: DC

## 2018-01-13 MED ORDER — ONDANSETRON HCL 4 MG PO TABS: 4.0000 mg | ORAL_TABLET | Freq: Four times a day (QID) | ORAL | Status: DC | PRN

## 2018-01-13 MED ORDER — SODIUM CHLORIDE 0.9 % IV BOLUS
1000.0000 mL | Freq: Once | INTRAVENOUS | Status: AC
  Administered 2018-01-13: 1000 mL via INTRAVENOUS

## 2018-01-13 MED ORDER — OCUVITE-LUTEIN PO CAPS
1.0000 | ORAL_CAPSULE | Freq: Every day | ORAL | Status: DC
  Filled 2018-01-13: qty 1

## 2018-01-13 MED ORDER — LEVOTHYROXINE SODIUM 50 MCG PO TABS
50.0000 ug | ORAL_TABLET | Freq: Every day | ORAL | Status: DC
  Administered 2018-01-14 – 2018-01-15 (×2): 50 ug via ORAL
  Filled 2018-01-13 (×2): qty 1

## 2018-01-13 MED ORDER — TIMOLOL HEMIHYDRATE 0.5 % OP SOLN: 1.0000 [drp] | Freq: Two times a day (BID) | OPHTHALMIC | Status: DC

## 2018-01-13 MED ORDER — SODIUM CHLORIDE 0.9 % IV SOLN
Freq: Once | INTRAVENOUS | Status: AC
  Administered 2018-01-13: 17:00:00 via INTRAVENOUS

## 2018-01-13 MED ORDER — DOCUSATE SODIUM 100 MG PO CAPS
100.0000 mg | ORAL_CAPSULE | Freq: Two times a day (BID) | ORAL | Status: DC
  Administered 2018-01-14 – 2018-01-15 (×2): 100 mg via ORAL
  Filled 2018-01-13 (×3): qty 1

## 2018-01-13 MED ORDER — BRIMONIDINE TARTRATE 0.2 % OP SOLN
1.0000 [drp] | Freq: Three times a day (TID) | OPHTHALMIC | Status: DC
  Administered 2018-01-14 – 2018-01-15 (×5): 1 [drp] via OPHTHALMIC
  Filled 2018-01-13: qty 5

## 2018-01-13 MED ORDER — SENNOSIDES-DOCUSATE SODIUM 8.6-50 MG PO TABS
1.0000 | ORAL_TABLET | Freq: Once | ORAL | Status: AC
  Administered 2018-01-13: 1 via ORAL
  Filled 2018-01-13: qty 1

## 2018-01-13 MED ORDER — TIMOLOL MALEATE 0.5 % OP SOLN
1.0000 [drp] | Freq: Two times a day (BID) | OPHTHALMIC | Status: DC
  Administered 2018-01-14 – 2018-01-15 (×3): 1 [drp] via OPHTHALMIC
  Filled 2018-01-13: qty 5

## 2018-01-13 MED ORDER — POLYETHYLENE GLYCOL 3350 17 G PO PACK: 17.0000 g | PACK | Freq: Every day | ORAL | Status: DC | PRN

## 2018-01-13 MED ORDER — VITAMIN D 1000 UNITS PO TABS
2000.0000 [IU] | ORAL_TABLET | Freq: Every day | ORAL | Status: DC
  Administered 2018-01-14 – 2018-01-15 (×2): 2000 [IU] via ORAL
  Filled 2018-01-13 (×2): qty 2

## 2018-01-13 MED ORDER — ACETAMINOPHEN 325 MG PO TABS: 650.0000 mg | ORAL_TABLET | Freq: Four times a day (QID) | ORAL | Status: DC | PRN

## 2018-01-13 MED ORDER — POTASSIUM CHLORIDE ER 10 MEQ PO TBCR
10.0000 meq | EXTENDED_RELEASE_TABLET | Freq: Every day | ORAL | Status: DC
  Administered 2018-01-14 – 2018-01-15 (×2): 10 meq via ORAL
  Filled 2018-01-13 (×4): qty 1

## 2018-01-13 MED ORDER — DORZOLAMIDE HCL 2 % OP SOLN
1.0000 [drp] | Freq: Every day | OPHTHALMIC | Status: DC
  Administered 2018-01-14 – 2018-01-15 (×2): 1 [drp] via OPHTHALMIC
  Filled 2018-01-13: qty 10

## 2018-01-13 MED ORDER — TRAMADOL HCL 50 MG PO TABS: 25.0000 mg | ORAL_TABLET | Freq: Four times a day (QID) | ORAL | Status: DC | PRN

## 2018-01-13 MED ORDER — RIVAROXABAN 15 MG PO TABS
15.0000 mg | ORAL_TABLET | Freq: Every day | ORAL | Status: DC
  Administered 2018-01-14 – 2018-01-15 (×2): 15 mg via ORAL
  Filled 2018-01-13 (×2): qty 1

## 2018-01-13 MED ORDER — POTASSIUM CHLORIDE 20 MEQ/15ML (10%) PO SOLN
20.0000 meq | Freq: Once | ORAL | Status: AC
  Administered 2018-01-13: 20 meq via ORAL
  Filled 2018-01-13: qty 15

## 2018-01-13 MED ORDER — METOPROLOL TARTRATE 100 MG PO TABS
100.0000 mg | ORAL_TABLET | Freq: Every day | ORAL | Status: DC
  Administered 2018-01-14 – 2018-01-15 (×2): 100 mg via ORAL
  Filled 2018-01-13 (×2): qty 1

## 2018-01-13 MED ORDER — ONDANSETRON HCL 4 MG/2ML IJ SOLN: 4.0000 mg | Freq: Four times a day (QID) | INTRAMUSCULAR | Status: DC | PRN

## 2018-01-13 MED ORDER — CHOLECALCIFEROL 50 MCG (2000 UT) PO CAPS: 2000.0000 [IU] | ORAL_CAPSULE | Freq: Every day | ORAL | Status: DC

## 2018-01-13 NOTE — ED Notes (Signed)
Pt transported to CT ?

## 2018-01-13 NOTE — ED Provider Notes (Signed)
[place] [facility] EMERGENCY DEPARTMENT Provider Note   CSN: [id number] Arrival date & time: 01/13/18  1226     History   Chief Complaint Chief Complaint  Patient presents with  . Fall    HPI Brianna Thompson is a 84 y.o. female.  History obtained from EMS, family is not here at this time.  Patient slightly altered in level 5 caveat as difficult to obtain history.  Patient denies any pain, has skin tear to the right elbow.  Supposedly had a fall and was found by family members today.  She lives at home with her husband she says.  She does not remember the fall.  Is not able to give any timeline about what happened.  Denies any hip pain, chest pain, shortness of breath.  Patient is on a blood thinner.  The history is provided by the patient.  Fall  This is a new problem. Episode onset: unknown. The problem occurs constantly. The problem has not changed since onset.Pertinent negatives include no chest pain, no abdominal pain, no headaches and no shortness of breath. Nothing aggravates the symptoms. Nothing relieves the symptoms. She has tried nothing for the symptoms. The treatment provided no relief.    Past Medical History:  Diagnosis Date  . Atrial fibrillation ([place])   . Glaucoma 02/17/2015  . Hypokalemia   . Hypothyroidism   . Irregular heart rhythm   . Macular degeneration     Patient Active Problem List   Diagnosis Date Noted  . Iron deficiency 10/06/2017  . Vitamin D insufficiency 10/06/2017  . History of osteopenia 03/10/2017  . Basal cell carcinoma-  R proximal central dorsal wrist 12/01/2016  . 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 Solar keratosis 10/07/2016  . History of skin cancer 08/15/2016  . Wound of left leg 08/15/2016  . Blood glucose elevated (& elevated A1c) 04/02/2016  . HLD (hyperlipidemia) 03/18/2016  . Chronic anticoagulation 03/18/2016  . Macular degeneration 03/18/2016  . h/o Irregular heart  rhythm 03/18/2016  . Encounter for screening for diseases of the blood and blood-forming organs and certain disorders involving the immune mechanism 03/18/2016  . Counseling on health promotion and disease prevention 03/18/2016  . Hip fracture ([place]) 02/17/2015  . Closed left hip fracture ([place]) 02/17/2015  . h/o Atrial fibrillation ([place]), CHA2DS2-VASc Score 5-6 02/17/2015  . HTN (hypertension) 02/17/2015  . Fall 02/17/2015  . H/O hypokalemia 02/17/2015  . Traumatic hematoma of left hand 02/17/2015  . Hypothyroidism 70/26/3785  . h/o Diastolic dysfunction with chronic heart failure ([place]) 02/17/2015  . Elevated CPK 02/17/2015  . Glaucoma 02/17/2015    Past Surgical History:  Procedure Laterality Date  . ABDOMINAL HYSTERECTOMY    . APPENDECTOMY    . CATARACT EXTRACTION Bilateral   . INTRAMEDULLARY (IM) NAIL INTERTROCHANTERIC Left 02/19/2015   Procedure: INTRAMEDULLARY (IM) NAIL INTERTROCHANTRIC;  Surgeon: Leandrew Koyanagi, MD;  Location: [facility];  Service: Orthopedics;  Laterality: Left;     OB History   None      Home Medications    Prior to Admission medications   Medication Sig Start Date End Date Taking? Authorizing Provider  acetaminophen (TYLENOL) 325 MG tablet Take 2 tablets (650 mg total) by mouth every 6 (six) hours as needed for mild pain, moderate pain, fever or headache. 02/21/15  Yes Short, Noah Delaine, MD  Besifloxacin HCl (BESIVANCE) 0.6 % SUSP Place 1 drop into the left eye every 30 (thirty) days.  Yes [provider]  brimonidine (ALPHAGAN) 0.2 % ophthalmic solution Place 1 drop into both eyes 3 (three) times daily. 02/23/16  Yes [provider]  Cholecalciferol (VITAMIN D3 SUPER STRENGTH) 2000 units CAPS Take 2,000 Units by mouth daily.    Yes [provider]  docusate sodium (COLACE) 100 MG capsule Take 1 capsule (100 mg total) by mouth 2 (two) times daily. 02/21/15  Yes Short, Noah Delaine, MD  dorzolamide (TRUSOPT) 2 % ophthalmic solution Place  1 drop into both eyes daily. 02/12/16  Yes [provider]  furosemide (LASIX) 20 MG tablet TAKE 1 TABLET (20 MG TOTAL) BY MOUTH DAILY. 10/24/17  Yes Opalski, Neoma Laming, DO  levothyroxine (SYNTHROID, LEVOTHROID) 50 MCG tablet TAKE 1 TABLET BY MOUTH DAILY BEFORE BREAKFAST. Patient taking differently: Take 50 mcg by mouth daily before breakfast.  11/26/17  Yes Opalski, Neoma Laming, DO  lovastatin (MEVACOR) 20 MG tablet TAKE 1 TABLET BY MOUTH DAILY AT 6:00 PM. Patient taking differently: Take 20 mg by mouth daily at 6 PM.  12/16/17  Yes Opalski, Neoma Laming, DO  metoprolol tartrate (LOPRESSOR) 100 MG tablet Take 1 tablet (100 mg total) by mouth daily. 10/06/17  Yes Opalski, Deborah, DO  Multiple Vitamins-Minerals (PRESERVISION AREDS 2) CAPS Take 1 capsule by mouth 2 (two) times daily.   Yes [provider]  potassium chloride (K-DUR) 10 MEQ tablet Take 1 tablet (10 mEq total) by mouth daily. 10/06/17  Yes Opalski, Neoma Laming, DO  Rivaroxaban (XARELTO) 15 MG TABS tablet TAKE 1 TABLET BY MOUTH DAILY WITH SUPPER Patient taking differently: Take 15 mg by mouth daily with supper.  10/06/17  Yes Opalski, Neoma Laming, DO  timolol (BETIMOL) 0.5 % ophthalmic solution Place 1 drop into both eyes 2 (two) times daily.   Yes [provider]    Family History Family History  Problem Relation Age of Onset  . Heart attack Father   . Diabetes Sister   . Cancer Brother        skin  . Cancer Son        prostate  . Cancer Brother        lung  . Cancer Brother        blood  . Cancer Brother        brain  . Heart attack Brother   . ALS Sister     Social History Social History   Tobacco Use  . Smoking status: Never Smoker  . Smokeless tobacco: Never Used  Substance Use Topics  . Alcohol use: No  . Drug use: No     Allergies   Calcium-containing compounds   Review of Systems Review of Systems  Constitutional: Negative for chills and fever.  HENT: Negative for ear pain and sore throat.     Eyes: Negative for pain and visual disturbance.  Respiratory: Negative for cough and shortness of breath.   Cardiovascular: Negative for chest pain and palpitations.  Gastrointestinal: Negative for abdominal pain and vomiting.  Genitourinary: Negative for dysuria and hematuria.  Musculoskeletal: Positive for arthralgias. Negative for back pain.  Skin: Negative for color change and rash.  Neurological: Negative for seizures, syncope and headaches.  All other systems reviewed and are negative.    Physical Exam Updated Vital Signs BP (!) 186/92   Pulse 91   Temp 98 F (36.7 C) (Oral)   Resp (!) 27   Ht 5\' 2"  (1.575 m)   Wt 49.9 kg   SpO2 99%   BMI 20.12 kg/m   Physical Exam  Constitutional: No distress.  HENT:  Head: Normocephalic and atraumatic.  Eyes: Pupils are equal, round, and reactive to light. Conjunctivae and EOM are normal.  Neck: Neck supple.  In c-collar  Cardiovascular: Normal rate, regular rhythm, normal heart sounds and intact distal pulses.  No murmur heard. Pulmonary/Chest: Effort normal and breath sounds normal. No respiratory distress.  Abdominal: Soft. Bowel sounds are normal. She exhibits no distension. There is no tenderness.  Musculoskeletal: Normal range of motion. She exhibits tenderness (TTP to right elbow). She exhibits no edema or deformity.  No midline spinal tenderness  Neurological: She is alert.  Alert pleasant, moves all extremities, normal strength throughout, normal sensation   Skin: Skin is warm and dry. Capillary refill takes less than 2 seconds.  Psychiatric: She has a normal mood and affect.  Nursing note and vitals reviewed.    ED Treatments / Results  Labs (all labs ordered are listed, but only abnormal results are displayed) Labs Reviewed  CBC WITH DIFFERENTIAL/PLATELET - Abnormal; Notable for the following components:      Result Value   WBC 12.0 (*)    RBC 3.69 (*)    MCV 106.2 (*)    MCH 36.9 (*)    Neutro Abs 9.8 (*)     All other components within normal limits  COMPREHENSIVE METABOLIC PANEL - Abnormal; Notable for the following components:   Potassium 3.4 (*)    Glucose, Bld 142 (*)    Calcium 10.8 (*)    AST 55 (*)    GFR calc non Af Amer 50 (*)    GFR calc Af Amer 58 (*)    All other components within normal limits  URINALYSIS, ROUTINE W REFLEX MICROSCOPIC - Abnormal; Notable for the following components:   APPearance HAZY (*)    pH 9.0 (*)    Hgb urine dipstick SMALL (*)    Ketones, ur 5 (*)    Protein, ur 30 (*)    All other components within normal limits  CK - Abnormal; Notable for the following components:   Total CK 1,074 (*)    All other components within normal limits  URINE CULTURE    EKG EKG Interpretation  Date/Time:  Tuesday January 13 2018 12:38:48 EDT Ventricular Rate:  99 PR Interval:    QRS Duration: 85 QT Interval:  386 QTC Calculation: 496 R Axis:   71 Text Interpretation:  Atrial fibrillation Consider left ventricular hypertrophy Anterior Q waves, possibly due to LVH Confirmed by Lennice Sites [phone]) on 01/13/2018 12:45:34 PM   Radiology Dg Elbow Complete Right  Result Date: 01/13/2018 CLINICAL DATA:  Laceration on the olecranon, unknown trauma EXAM: RIGHT ELBOW - COMPLETE 3+ VIEW COMPARISON:  None. FINDINGS: Alignment is normal. The elbow joint appears normal. No fracture is seen. No joint effusion is noted. Mild soft tissue prominence of the olecranon is noted with calcification which could indicate chronic calcific bursitis. IMPRESSION: 1. No fracture or joint effusion. 2. Possible chronic calcific olecranon bursitis. Correlate clinically. Electronically Signed   By: Ivar Drape M.D.   On: 01/13/2018 16:09   Ct Head Wo Contrast  Result Date: 01/13/2018 CLINICAL DATA:  Fall EXAM: CT HEAD WITHOUT CONTRAST CT CERVICAL SPINE WITHOUT CONTRAST TECHNIQUE: Multidetector CT imaging of the head and cervical spine was performed following the standard protocol without  intravenous contrast. Multiplanar CT image reconstructions of the cervical spine were also generated. COMPARISON:  CT head 02/17/2015 FINDINGS: CT HEAD FINDINGS Brain: Moderate atrophy with ventricular enlargement. Progression of atrophy.  Chronic microvascular ischemic changes in the white matter. Negative for acute infarct, hemorrhage, or mass. Vascular: Negative for hyperdense vessel Skull: Negative for skull fracture Sinuses/Orbits: Mucosal edema and bony thickening of the right maxillary sinus. Mucosal edema right frontal and ethmoid sinus. Bilateral cataract surgery. Other: None CT CERVICAL SPINE FINDINGS Alignment: Mild anterolisthesis C4-5 and C5-6 Skull base and vertebrae: Negative for fracture. Hemangioma C7 and T1 vertebral bodies. Soft tissues and spinal canal: Multinodular goiter. Diffuse atherosclerotic calcification Disc levels: Diffuse disc and facet degeneration throughout the cervical spine. Mild foraminal narrowing bilaterally at multiple levels. Spinal canal adequate in size. Upper chest: No acute abnormality. Other: None IMPRESSION: 1. Atrophy and chronic microvascular ischemia. No acute intracranial abnormality 2. Negative for cervical spine fracture. Moderate degenerative change. Electronically Signed   By: Franchot Gallo M.D.   On: 01/13/2018 15:11   Ct Cervical Spine Wo Contrast  Result Date: 01/13/2018 CLINICAL DATA:  Fall EXAM: CT HEAD WITHOUT CONTRAST CT CERVICAL SPINE WITHOUT CONTRAST TECHNIQUE: Multidetector CT imaging of the head and cervical spine was performed following the standard protocol without intravenous contrast. Multiplanar CT image reconstructions of the cervical spine were also generated. COMPARISON:  CT head 02/17/2015 FINDINGS: CT HEAD FINDINGS Brain: Moderate atrophy with ventricular enlargement. Progression of atrophy. Chronic microvascular ischemic changes in the white matter. Negative for acute infarct, hemorrhage, or mass. Vascular: Negative for hyperdense vessel  Skull: Negative for skull fracture Sinuses/Orbits: Mucosal edema and bony thickening of the right maxillary sinus. Mucosal edema right frontal and ethmoid sinus. Bilateral cataract surgery. Other: None CT CERVICAL SPINE FINDINGS Alignment: Mild anterolisthesis C4-5 and C5-6 Skull base and vertebrae: Negative for fracture. Hemangioma C7 and T1 vertebral bodies. Soft tissues and spinal canal: Multinodular goiter. Diffuse atherosclerotic calcification Disc levels: Diffuse disc and facet degeneration throughout the cervical spine. Mild foraminal narrowing bilaterally at multiple levels. Spinal canal adequate in size. Upper chest: No acute abnormality. Other: None IMPRESSION: 1. Atrophy and chronic microvascular ischemia. No acute intracranial abnormality 2. Negative for cervical spine fracture. Moderate degenerative change. Electronically Signed   By: Franchot Gallo M.D.   On: 01/13/2018 15:11   Dg Chest Portable 1 View  Result Date: 01/13/2018 CLINICAL DATA:  [place] today. History of atrial fibrillation. No current chest complaints. EXAM: PORTABLE CHEST 1 VIEW COMPARISON:  Portable chest x-ray dated February 17, 2015 FINDINGS: The lungs are mildly hyperinflated. The interstitial markings are coarse. There is no alveolar infiltrate or pleural effusion. There is no pneumothorax. The cardiac silhouette is chronically enlarged. The pulmonary vascularity is normal. There is calcification in the wall of the aortic arch. The bony thorax exhibits no acute abnormality. IMPRESSION: Chronic bronchitic changes. Stable cardiomegaly. There is no acute cardiopulmonary abnormality. Thoracic aortic atherosclerosis. Electronically Signed   By: David  [place]ique M.D.   On: 01/13/2018 13:30    Procedures Procedures (including critical care time)  Medications Ordered in ED Medications  potassium chloride (KLOR-CON) packet 20 mEq (has no administration in time range)  sodium chloride 0.9 % bolus 1,000 mL (0 mLs Intravenous Stopped  01/13/18 1652)  0.9 %  sodium chloride infusion ( Intravenous New Bag/Given 01/13/18 1726)     Initial Impression / Assessment and Plan / ED Course  I have reviewed the triage vital signs and the nursing notes.  Pertinent labs & imaging results that were available during my care of the patient were reviewed by me and considered in my medical decision making (see chart for details).     Brianna Thompson  is a 84 year old female with history of atrial fibrillation on Xarelto who presents to the ED with fall.  Patient with mild tachycardia and hypertension upon arrival.  Patient with unwitnessed fall that occurred at some point over the last 16 hours.  Patient was found by family member this morning lying in the hallway.  Had abrasion to her right elbow.  Patient was last seen about 12 to 16 hours prior.  She is alert and awake.  Complaining of some right elbow pain.  Otherwise has no bony tenderness on exam.  She is able to move all of her extremities.  She appears mildly altered.  Has a history of dementia.  She lives by herself but family member lives next door.  Concern for dehydration, rhabdomyolysis given length of stay on the floor.  Lab work collected, CT head and neck and extremity images obtained.  CT of the head and neck are unremarkable.  X-ray of the right elbow is unremarkable.  CK is elevated.  Mild rhabdomyolysis.  Urinalysis does not show any infection.  Patient was given normal saline bolus and started on normal saline infusion.  Her mentation has improved greatly with IV fluids.  However given the concern for decompensation following the fall will admit the patient for further hydration, physical therapy occupational therapy.  She may need long-term placement.  Hemodynamically stable throughout my care.  This chart was dictated using voice recognition software.  Despite best efforts to proofread,  errors can occur which can change the documentation meaning.   Final Clinical  Impressions(s) / ED Diagnoses   Final diagnoses:  Fall, initial encounter  Elevated CK  Dehydration    ED Discharge Orders    None       Lennice Sites, DO 01/13/18 1907

## 2018-01-13 NOTE — ED Triage Notes (Signed)
Pt presents with c-collar and skin tear to R elbow after falling sometime today.  Pt unsure of why she fell, denies any pain; found by family member.

## 2018-01-13 NOTE — H&P (Addendum)
History and Physical    Brianna Thompson:[id number] DOB: Aug 12, 1921 DOA: 01/13/2018  PCP: Mellody Dance, DO  Patient coming from: Home  I have personally briefly reviewed patient's old medical records in [place]  Chief Complaint: fall  HPI: Brianna Thompson is a 84 y.o. female with medical history significant of  Afib, hypothyroid presents after fall.  Patient lives at home alone.  She typically walks with a cane or a walker.  She was found today on the floor.  She lives next to her daughter-in-law who is her only living family member .  She cannot tell me exactly how she fell.  But her daughter-in-law states that she has been becoming more more confused and more unstable recently.  Patient denies any chest pain or shortness of breath.  She denies any focal pain at this time.   ED Course: Patient found to have elevated CK slightly above 1000.  Her chest x-ray, cervical spine, head CT and elbow x-ray are all nonacute.  She was given 1 L of IV fluids in the ED.  Review of Systems: + constipation, otherwise somewhat limited due to dementia    Past Medical History:  Diagnosis Date  . Atrial fibrillation ([place])   . Glaucoma 02/17/2015  . Hypokalemia   . Hypothyroidism   . Irregular heart rhythm   . Macular degeneration     Past Surgical History:  Procedure Laterality Date  . ABDOMINAL HYSTERECTOMY    . APPENDECTOMY    . CATARACT EXTRACTION Bilateral   . INTRAMEDULLARY (IM) NAIL INTERTROCHANTERIC Left 02/19/2015   Procedure: INTRAMEDULLARY (IM) NAIL INTERTROCHANTRIC;  Surgeon: Leandrew Koyanagi, MD;  Location: [facility];  Service: Orthopedics;  Laterality: Left;     reports that she has never smoked. She has never used smokeless tobacco. She reports that she does not drink alcohol or use drugs.  Allergies  Allergen Reactions  . Calcium-Containing Compounds Other (See Comments)    constipation    Family History  Problem Relation Age of Onset  . Heart attack Father   .  Diabetes Sister   . Cancer Brother        skin  . Cancer Son        prostate  . Cancer Brother        lung  . Cancer Brother        blood  . Cancer Brother        brain  . Heart attack Brother   . ALS Sister     Prior to Admission medications   Medication Sig Start Date End Date Taking? Authorizing Provider  acetaminophen (TYLENOL) 325 MG tablet Take 2 tablets (650 mg total) by mouth every 6 (six) hours as needed for mild pain, moderate pain, fever or headache. 02/21/15  Yes Short, Noah Delaine, MD  Besifloxacin HCl (BESIVANCE) 0.6 % SUSP Place 1 drop into the left eye every 30 (thirty) days.    Yes [provider]  brimonidine (ALPHAGAN) 0.2 % ophthalmic solution Place 1 drop into both eyes 3 (three) times daily. 02/23/16  Yes [provider]  Cholecalciferol (VITAMIN D3 SUPER STRENGTH) 2000 units CAPS Take 2,000 Units by mouth daily.    Yes [provider]  docusate sodium (COLACE) 100 MG capsule Take 1 capsule (100 mg total) by mouth 2 (two) times daily. 02/21/15  Yes Short, Noah Delaine, MD  dorzolamide (TRUSOPT) 2 % ophthalmic solution Place 1 drop into both eyes daily. 02/12/16  Yes [provider]  furosemide (  LASIX) 20 MG tablet TAKE 1 TABLET (20 MG TOTAL) BY MOUTH DAILY. 10/24/17  Yes Opalski, Neoma Laming, DO  levothyroxine (SYNTHROID, LEVOTHROID) 50 MCG tablet TAKE 1 TABLET BY MOUTH DAILY BEFORE BREAKFAST. Patient taking differently: Take 50 mcg by mouth daily before breakfast.  11/26/17  Yes Opalski, Neoma Laming, DO  lovastatin (MEVACOR) 20 MG tablet TAKE 1 TABLET BY MOUTH DAILY AT 6:00 PM. Patient taking differently: Take 20 mg by mouth daily at 6 PM.  12/16/17  Yes Opalski, Neoma Laming, DO  metoprolol tartrate (LOPRESSOR) 100 MG tablet Take 1 tablet (100 mg total) by mouth daily. 10/06/17  Yes Opalski, Deborah, DO  Multiple Vitamins-Minerals (PRESERVISION AREDS 2) CAPS Take 1 capsule by mouth 2 (two) times daily.   Yes [provider]  potassium chloride  (K-DUR) 10 MEQ tablet Take 1 tablet (10 mEq total) by mouth daily. 10/06/17  Yes Opalski, Neoma Laming, DO  Rivaroxaban (XARELTO) 15 MG TABS tablet TAKE 1 TABLET BY MOUTH DAILY WITH SUPPER Patient taking differently: Take 15 mg by mouth daily with supper.  10/06/17  Yes Opalski, Neoma Laming, DO  timolol (BETIMOL) 0.5 % ophthalmic solution Place 1 drop into both eyes 2 (two) times daily.   Yes [provider]    Physical Exam: Vitals:   01/13/18 1845 01/13/18 1900 01/13/18 2000 01/13/18 2121  BP: (!) 190/85 (!) 186/92 (!) 175/96 (!) 171/91  Pulse: 94 91 100 93  Resp:   (!) 21 18  Temp:    97.7 F (36.5 C)  TempSrc:    Oral  SpO2: 94% 99% 97% 99%  Weight:      Height:        Constitutional: NAD, calm, comfortable Vitals:   01/13/18 1845 01/13/18 1900 01/13/18 2000 01/13/18 2121  BP: (!) 190/85 (!) 186/92 (!) 175/96 (!) 171/91  Pulse: 94 91 100 93  Resp:   (!) 21 18  Temp:    97.7 F (36.5 C)  TempSrc:    Oral  SpO2: 94% 99% 97% 99%  Weight:      Height:       Eyes: PERRL, lids and conjunctivae normal ENMT: Mucous membranes are moist. Posterior pharynx clear of any exudate or lesions. adentulous .  Neck: normal, supple, no masses, y Respiratory: clear to auscultation bilaterally, no wheezing, no crackles. Normal respiratory effort. No accessory muscle use.  Cardiovascular: Regular rate and rhythm, no murmurs / rubs / gallops. No extremity edema. 1+ pedal pulses.  Abdomen: no tenderness, no masses palpated. No hepatosplenomegaly. Bowel sounds positive.  Musculoskeletal: no clubbing / cyanosis. No joint deformity upper and lower extremities. Good ROM, no contractures. .  Skin: . No induration skin tear right elbow  Neurologic: CN 2-12 grossly intact. Moves all 4 extremities equally .  Psychiatric: . Alert and oriented self, bday . Normal mood.     Labs on Admission: I have personally reviewed following labs and imaging studies  CBC: Recent Labs  Lab 01/08/18 1144  01/13/18 1246  WBC 5.7 12.0*  NEUTROABS 3.3 9.8*  HGB 13.2 13.6  HCT 35.2 39.2  MCV 101* 106.2*  PLT 253 250   Basic Metabolic Panel: Recent Labs  Lab 01/08/18 1144 01/13/18 1246  NA 145* 140  K 4.2 3.4*  CL 101 102  CO2 26 29  GLUCOSE 118* 142*  BUN 20 21  CREATININE 1.10* 0.95  CALCIUM 11.0* 10.8*   GFR: Estimated Creatinine Clearance: 28.5 mL/min (by C-G formula based on SCr of 0.95 mg/dL). Liver Function Tests: Recent Labs  Lab 01/08/18 1144 01/13/18 1246  AST 26 55*  ALT 20 34  ALKPHOS 96 88  BILITOT 0.6 1.1  PROT 7.3 7.6  ALBUMIN 5.0* 4.4   No results for input(s): LIPASE, AMYLASE in the last 168 hours. No results for input(s): AMMONIA in the last 168 hours. Coagulation Profile: No results for input(s): INR, PROTIME in the last 168 hours. Cardiac Enzymes: Recent Labs  Lab 01/13/18 1246  CKTOTAL 1,074*   BNP (last 3 results) No results for input(s): PROBNP in the last 8760 hours. HbA1C: No results for input(s): HGBA1C in the last 72 hours. CBG: No results for input(s): GLUCAP in the last 168 hours. Lipid Profile: No results for input(s): CHOL, HDL, LDLCALC, TRIG, CHOLHDL, LDLDIRECT in the last 72 hours. Thyroid Function Tests: No results for input(s): TSH, T4TOTAL, FREET4, T3FREE, THYROIDAB in the last 72 hours. Anemia Panel: No results for input(s): VITAMINB12, FOLATE, FERRITIN, TIBC, IRON, RETICCTPCT in the last 72 hours. Urine analysis:    Component Value Date/Time   COLORURINE YELLOW 01/13/2018 1704   APPEARANCEUR HAZY (A) 01/13/2018 1704   LABSPEC 1.009 01/13/2018 1704   PHURINE 9.0 (H) 01/13/2018 1704   GLUCOSEU NEGATIVE 01/13/2018 1704   HGBUR SMALL (A) 01/13/2018 1704   BILIRUBINUR NEGATIVE 01/13/2018 1704   BILIRUBINUR negative 03/10/2017 1636   KETONESUR 5 (A) 01/13/2018 1704   PROTEINUR 30 (A) 01/13/2018 1704   UROBILINOGEN 0.2 03/10/2017 1636   UROBILINOGEN 0.2 02/17/2015 1822   NITRITE NEGATIVE 01/13/2018 1704   LEUKOCYTESUR  NEGATIVE 01/13/2018 1704    Radiological Exams on Admission: Dg Elbow Complete Right  Result Date: 01/13/2018 CLINICAL DATA:  Laceration on the olecranon, unknown trauma EXAM: RIGHT ELBOW - COMPLETE 3+ VIEW COMPARISON:  None. FINDINGS: Alignment is normal. The elbow joint appears normal. No fracture is seen. No joint effusion is noted. Mild soft tissue prominence of the olecranon is noted with calcification which could indicate chronic calcific bursitis. IMPRESSION: 1. No fracture or joint effusion. 2. Possible chronic calcific olecranon bursitis. Correlate clinically. Electronically Signed   By: Ivar Drape M.D.   On: 01/13/2018 16:09   Ct Head Wo Contrast  Result Date: 01/13/2018 CLINICAL DATA:  Fall EXAM: CT HEAD WITHOUT CONTRAST CT CERVICAL SPINE WITHOUT CONTRAST TECHNIQUE: Multidetector CT imaging of the head and cervical spine was performed following the standard protocol without intravenous contrast. Multiplanar CT image reconstructions of the cervical spine were also generated. COMPARISON:  CT head 02/17/2015 FINDINGS: CT HEAD FINDINGS Brain: Moderate atrophy with ventricular enlargement. Progression of atrophy. Chronic microvascular ischemic changes in the white matter. Negative for acute infarct, hemorrhage, or mass. Vascular: Negative for hyperdense vessel Skull: Negative for skull fracture Sinuses/Orbits: Mucosal edema and bony thickening of the right maxillary sinus. Mucosal edema right frontal and ethmoid sinus. Bilateral cataract surgery. Other: None CT CERVICAL SPINE FINDINGS Alignment: Mild anterolisthesis C4-5 and C5-6 Skull base and vertebrae: Negative for fracture. Hemangioma C7 and T1 vertebral bodies. Soft tissues and spinal canal: Multinodular goiter. Diffuse atherosclerotic calcification Disc levels: Diffuse disc and facet degeneration throughout the cervical spine. Mild foraminal narrowing bilaterally at multiple levels. Spinal canal adequate in size. Upper chest: No acute  abnormality. Other: None IMPRESSION: 1. Atrophy and chronic microvascular ischemia. No acute intracranial abnormality 2. Negative for cervical spine fracture. Moderate degenerative change. Electronically Signed   By: Franchot Gallo M.D.   On: 01/13/2018 15:11   Ct Cervical Spine Wo Contrast  Result Date: 01/13/2018 CLINICAL DATA:  Fall EXAM: CT HEAD WITHOUT CONTRAST CT  CERVICAL SPINE WITHOUT CONTRAST TECHNIQUE: Multidetector CT imaging of the head and cervical spine was performed following the standard protocol without intravenous contrast. Multiplanar CT image reconstructions of the cervical spine were also generated. COMPARISON:  CT head 02/17/2015 FINDINGS: CT HEAD FINDINGS Brain: Moderate atrophy with ventricular enlargement. Progression of atrophy. Chronic microvascular ischemic changes in the white matter. Negative for acute infarct, hemorrhage, or mass. Vascular: Negative for hyperdense vessel Skull: Negative for skull fracture Sinuses/Orbits: Mucosal edema and bony thickening of the right maxillary sinus. Mucosal edema right frontal and ethmoid sinus. Bilateral cataract surgery. Other: None CT CERVICAL SPINE FINDINGS Alignment: Mild anterolisthesis C4-5 and C5-6 Skull base and vertebrae: Negative for fracture. Hemangioma C7 and T1 vertebral bodies. Soft tissues and spinal canal: Multinodular goiter. Diffuse atherosclerotic calcification Disc levels: Diffuse disc and facet degeneration throughout the cervical spine. Mild foraminal narrowing bilaterally at multiple levels. Spinal canal adequate in size. Upper chest: No acute abnormality. Other: None IMPRESSION: 1. Atrophy and chronic microvascular ischemia. No acute intracranial abnormality 2. Negative for cervical spine fracture. Moderate degenerative change. Electronically Signed   By: Franchot Gallo M.D.   On: 01/13/2018 15:11   Dg Chest Portable 1 View  Result Date: 01/13/2018 CLINICAL DATA:  [place] today. History of atrial fibrillation. No current  chest complaints. EXAM: PORTABLE CHEST 1 VIEW COMPARISON:  Portable chest x-ray dated February 17, 2015 FINDINGS: The lungs are mildly hyperinflated. The interstitial markings are coarse. There is no alveolar infiltrate or pleural effusion. There is no pneumothorax. The cardiac silhouette is chronically enlarged. The pulmonary vascularity is normal. There is calcification in the wall of the aortic arch. The bony thorax exhibits no acute abnormality. IMPRESSION: Chronic bronchitic changes. Stable cardiomegaly. There is no acute cardiopulmonary abnormality. Thoracic aortic atherosclerosis. Electronically Signed   By: David  Martinique M.D.   On: 01/13/2018 13:30    EKG: Independently reviewed. afib abnml ekg   Assessment/Plan Principal Problem:   Fall Active Problems:   h/o Atrial fibrillation (HCC), CHA2DS2-VASc Score 5-6   HTN (hypertension)   Hypothyroidism   Elevated CPK   Chronic anticoagulation   -Pt has received 1 liter IVF. repeat ck in am, supportive care. Hold statin and diuretic -Cont home coreg and Xarelto for Hx afib. Consider stopped AC due to fall risk -Cont thyroid replacement -PT and case management consult   DVT prophylaxis: Cont Xarelto  Code Status: DNR  Family Communication: karen Wyatt [name] at bedside Disposition Plan: STR   Admission status: Obs med    Shelbie Proctor MD [facility] Pager [phone]  If 7PM-7AM, please contact night-coverage [email or web address] Password [email or web address]  01/13/2018, 9:35 PM

## 2018-01-13 NOTE — ED Notes (Signed)
Pt placed on Purewick at 12:50.

## 2018-01-14 ENCOUNTER — Other Ambulatory Visit: Payer: Self-pay

## 2018-01-14 DIAGNOSIS — I10 Essential (primary) hypertension: Secondary | ICD-10-CM

## 2018-01-14 DIAGNOSIS — E039 Hypothyroidism, unspecified: Secondary | ICD-10-CM

## 2018-01-14 DIAGNOSIS — R748 Abnormal levels of other serum enzymes: Secondary | ICD-10-CM | POA: Diagnosis not present

## 2018-01-14 DIAGNOSIS — Z7901 Long term (current) use of anticoagulants: Secondary | ICD-10-CM | POA: Diagnosis not present

## 2018-01-14 DIAGNOSIS — E440 Moderate protein-calorie malnutrition: Secondary | ICD-10-CM

## 2018-01-14 LAB — BASIC METABOLIC PANEL
ANION GAP: 8 (ref 5–15)
BUN: 16 mg/dL (ref 8–23)
CO2: 24 mmol/L (ref 22–32)
Calcium: 9.6 mg/dL (ref 8.9–10.3)
Chloride: 109 mmol/L (ref 98–111)
Creatinine, Ser: 0.97 mg/dL (ref 0.44–1.00)
GFR, EST AFRICAN AMERICAN: 56 mL/min — AB (ref >60)
GFR, EST NON AFRICAN AMERICAN: 48 mL/min — AB (ref >60)
Glucose, Bld: 117 mg/dL — ABNORMAL HIGH (ref 70–99)
Potassium: 3.7 mmol/L (ref 3.5–5.1)
Sodium: 141 mmol/L (ref 135–145)

## 2018-01-14 LAB — CK: Total CK: 703 U/L — ABNORMAL HIGH (ref 38–234)

## 2018-01-14 MED ORDER — SODIUM CHLORIDE 0.9 % IV SOLN
Freq: Once | INTRAVENOUS | Status: AC
  Administered 2018-01-14: 18:00:00 via INTRAVENOUS

## 2018-01-14 MED ORDER — PROSIGHT PO TABS
1.0000 | ORAL_TABLET | Freq: Every day | ORAL | Status: DC
  Administered 2018-01-14 – 2018-01-15 (×2): 1 via ORAL
  Filled 2018-01-14 (×2): qty 1

## 2018-01-14 MED ORDER — ENSURE ENLIVE PO LIQD
237.0000 mL | Freq: Two times a day (BID) | ORAL | Status: DC
  Administered 2018-01-14 – 2018-01-15 (×3): 237 mL via ORAL

## 2018-01-14 NOTE — Discharge Instructions (Signed)

## 2018-01-14 NOTE — Progress Notes (Signed)
Initial Nutrition Assessment  DOCUMENTATION CODES:   Non-severe (moderate) malnutrition in context of chronic illness  INTERVENTION:   - Ensure Enlive po BID, each supplement provides 350 kcal and 20 grams of protein (vanilla)  - Encourage adequate PO intake  NUTRITION DIAGNOSIS:   Moderate Malnutrition related to chronic illness (dementia) as evidenced by mild fat depletion, moderate fat depletion, mild muscle depletion, moderate muscle depletion, severe fat depletion.  GOAL:   Patient will meet greater than or equal to 90% of their needs  MONITOR:   PO intake, Supplement acceptance, Skin, Weight trends  REASON FOR ASSESSMENT:   Malnutrition Screening Tool    ASSESSMENT:   84 year old female who presented to the ED after a fall. PMH significant for atrial fibrillation, hypothyroidism, and dementia.  Spoke with pt and daughter-in-law at bedside. Pt's DIL reports pt consumed about 50% of lunch meal and has been eating "very little for quite a while." Pt's DIL states that pt was "never much of an eater" and that she is eating less as she gets older.  Pt typically eats 2-3 meals daily. Pt's DIL brings lunch and dinner to pt and sits with pt during lunch to make sure she eats it. Pt may skip dinner because of going to sleep early.  Breakfast: cereal bar or crackers or oatmeal Lunch: leftovers from night before Dinner: skips or salmon, zucchini, roll, potato skins  Pt drinks water and about half of a Boost oral nutrition supplement daily. Pt is amenable to receiving Ensure Enlive during admission.  Pt denies any issues chewing or swallowing.  Pt and DIL endorse weight loss and state that pt's UBW is 136 lbs and that she last weighed this 1-2 years ago. Pt's DIL reports pt weight 110 lbs at her physical on 9/26.  Per weight history in chart, pt has lost 10 lbs since 06/10/17. This is an 8.3% weight loss in less than 9 months which is not significant for timeframe.  Wt  Readings from Last 10 Encounters:  01/13/18 49.9 kg  01/08/18 49.9 kg  10/06/17 52.2 kg  06/10/17 54.4 kg  05/13/17 54.4 kg  03/10/17 53.1 kg  11/06/16 55.8 kg  10/07/16 56.8 kg  08/15/16 56.6 kg  07/31/16 57.4 kg   Medications reviewed and include: vitamin D 2000 units daily, Colace 100 mg BID, levothyroxine 50 mcg daily, Prosight MVI daily, K-dur 10 mEq daily  Labs reviewed.  NUTRITION - FOCUSED PHYSICAL EXAM:    Most Recent Value  Orbital Region  Moderate depletion  Upper Arm Region  Moderate depletion  Thoracic and Lumbar Region  Mild depletion  Buccal Region  Mild depletion  Temple Region  Mild depletion  Clavicle Bone Region  Moderate depletion  Clavicle and Acromion Bone Region  Moderate depletion  Scapular Bone Region  Moderate depletion  Dorsal Hand  Severe depletion  Patellar Region  Severe depletion  Anterior Thigh Region  Severe depletion  Posterior Calf Region  Severe depletion  Edema (RD Assessment)  None  Hair  Reviewed  Eyes  Reviewed  Mouth  Reviewed  Skin  Reviewed  Nails  Reviewed       Diet Order:   Diet Order            Diet Heart Room service appropriate? Yes; Fluid consistency: Thin  Diet effective now              EDUCATION NEEDS:   No education needs have been identified at this time  Skin:  Skin Assessment:  Reviewed RN Assessment  Last BM:  10/1  Height:   Ht Readings from Last 1 Encounters:  01/13/18 5\' 2"  (1.575 m)    Weight:   Wt Readings from Last 1 Encounters:  01/13/18 49.9 kg    Ideal Body Weight:  50 kg  BMI:  Body mass index is 20.12 kg/m.  Estimated Nutritional Needs:   Kcal:  1400-1600  Protein:  70-95 grams  Fluid:  1.4-1.6 L    Gaynell Face, MS, RD, LDN Inpatient Clinical Dietitian Pager: [phone] Weekend/After Hours: [phone]

## 2018-01-14 NOTE — NC FL2 (Signed)
[place] MEDICAID FL2 LEVEL OF CARE SCREENING TOOL     IDENTIFICATION  Patient Name: Brianna Thompson Birthdate: [birth year] Sex: female Admission Date (Current Location): 01/13/2018  [employer] [facility] and [place] Number:  Herbalist and Address:  [facility], [place] [address] [place], [place] [address]      Provider Number: [id number]  Attending Physician Name and Address:  Patrecia Pour, MD  Relative Name and Phone Number:  Janet Berlin [phone]    Current Level of Care: Hospital Recommended Level of Care: [place] Prior Approval Number:    Date Approved/Denied:   PASRR Number: [id number]  Discharge Plan: SNF    Current Diagnoses: Patient Active Problem List   Diagnosis Date Noted  . Iron deficiency 10/06/2017  . Vitamin D insufficiency 10/06/2017  . History of osteopenia 03/10/2017  . Basal cell carcinoma-  R proximal central dorsal wrist 12/01/2016  . 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 Solar keratosis 10/07/2016  . History of skin cancer 08/15/2016  . Wound of left leg 08/15/2016  . Blood glucose elevated (& elevated A1c) 04/02/2016  . HLD (hyperlipidemia) 03/18/2016  . Chronic anticoagulation 03/18/2016  . Macular degeneration 03/18/2016  . h/o Irregular heart rhythm 03/18/2016  . Encounter for screening for diseases of the blood and blood-forming organs and certain disorders involving the immune mechanism 03/18/2016  . Counseling on health promotion and disease prevention 03/18/2016  . Hip fracture ([place]) 02/17/2015  . Closed left hip fracture ([place]) 02/17/2015  . h/o Atrial fibrillation ([place]), CHA2DS2-VASc Score 5-6 02/17/2015  . HTN (hypertension) 02/17/2015  . Fall 02/17/2015  . H/O hypokalemia 02/17/2015  . Traumatic hematoma of left hand 02/17/2015  . Hypothyroidism 49/70/2637  . h/o Diastolic dysfunction with chronic heart failure ([place]) 02/17/2015  . Elevated CPK  02/17/2015  . Glaucoma 02/17/2015    Orientation RESPIRATION BLADDER Height & Weight     Self  Normal Incontinent, External catheter Weight: 49.9 kg Height:  5\' 2"  (157.5 cm)  BEHAVIORAL SYMPTOMS/MOOD NEUROLOGICAL BOWEL NUTRITION STATUS  (NA)   Continent Diet(Please see DC Summary)  AMBULATORY STATUS COMMUNICATION OF NEEDS Skin   Extensive Assist Verbally Normal                       Personal Care Assistance Level of Assistance  Bathing, Feeding, Dressing Bathing Assistance: Maximum assistance Feeding assistance: Limited assistance Dressing Assistance: Limited assistance     Functional Limitations Info  Sight, Hearing, Speech Sight Info: Adequate Hearing Info: Adequate Speech Info: Adequate    SPECIAL CARE FACTORS FREQUENCY  PT (By licensed PT), OT (By licensed OT)     PT Frequency: 5x/week OT Frequency: 3x/week            Contractures Contractures Info: Not present    Additional Factors Info  Code Status, Allergies Code Status Info: DNR Allergies Info: Allergies:  Calcium-containing Compounds           Current Medications (01/14/2018):  This is the current hospital active medication list Current Facility-Administered Medications  Medication Dose Route Frequency Provider Last Rate Last Dose  . acetaminophen (TYLENOL) tablet 650 mg  650 mg Oral Q6H PRN Johnson-Pitts, Endia, MD      . brimonidine (ALPHAGAN) 0.2 % ophthalmic solution 1 drop  1 drop Both Eyes TID Johnson-Pitts, Endia, MD   1 drop at 01/14/18 1022  . cholecalciferol (VITAMIN D) tablet 2,000 Units  2,000 Units  Oral Daily Johnson-Pitts, Endia, MD   2,000 Units at 01/14/18 1021  . docusate sodium (COLACE) capsule 100 mg  100 mg Oral BID Johnson-Pitts, Endia, MD   100 mg at 01/14/18 1021  . dorzolamide (TRUSOPT) 2 % ophthalmic solution 1 drop  1 drop Both Eyes Daily Johnson-Pitts, Endia, MD   1 drop at 01/14/18 1022  . levothyroxine (SYNTHROID, LEVOTHROID) tablet 50 mcg  50 mcg Oral QAC breakfast  Johnson-Pitts, Endia, MD   50 mcg at 01/14/18 1022  . metoprolol tartrate (LOPRESSOR) tablet 100 mg  100 mg Oral Daily Johnson-Pitts, Endia, MD   100 mg at 01/14/18 1023  . multivitamin (PROSIGHT) tablet 1 tablet  1 tablet Oral Daily Johnson-Pitts, Endia, MD   1 tablet at 01/14/18 1022  . ondansetron (ZOFRAN) tablet 4 mg  4 mg Oral Q6H PRN Johnson-Pitts, Endia, MD       Or  . ondansetron (ZOFRAN) injection 4 mg  4 mg Intravenous Q6H PRN Johnson-Pitts, Endia, MD      . polyethylene glycol (MIRALAX / GLYCOLAX) packet 17 g  17 g Oral Daily PRN Johnson-Pitts, Endia, MD      . potassium chloride (K-DUR) CR tablet 10 mEq  10 mEq Oral Daily Johnson-Pitts, Endia, MD   10 mEq at 01/14/18 1021  . Rivaroxaban (XARELTO) tablet 15 mg  15 mg Oral Q supper Johnson-Pitts, Endia, MD      . timolol (TIMOPTIC) 0.5 % ophthalmic solution 1 drop  1 drop Both Eyes BID Johnson-Pitts, Endia, MD   1 drop at 01/14/18 1022  . traMADol (ULTRAM) tablet 25 mg  25 mg Oral Q6H PRN Johnson-Pitts, Endia, MD         Discharge Medications: Please see discharge summary for a list of discharge medications.  Relevant Imaging Results:  Relevant Lab Results:   Additional Information SS#: [id number]  Benard Halsted, LCSW

## 2018-01-14 NOTE — Progress Notes (Addendum)
PROGRESS NOTE  Brianna Thompson  HAL:[id number] DOB: Jan 16, 1922 DOA: 01/13/2018 PCP: Mellody Dance, DO   Brief Narrative: Brianna Thompson is a 84 y.o. female who lives alone with a history of AFib and hypothyroidism who presented from home where she was found down by her daughter-in-law who also reports increasing confusion and gait instability recently. On presentation, vital signs stable. Chest and elbow x-rays, cervical spine and head CTs were nonacute. CK noted to be elevated, so IV fluids were started. She is not safe for discharge back to her home environment, so social work is consulted for placement per PT recommendations.  Assessment & Plan: Principal Problem:   Fall Active Problems:   h/o Atrial fibrillation ([place]), CHA2DS2-VASc Score 5-6   HTN (hypertension)   Hypothyroidism   Elevated CPK   Chronic anticoagulation   Malnutrition of moderate degree  Mild rhabdomyolysis: Improving with IV fluids.  - Hold statin and diuretic - Continue IV fluids x1 more day  Acute encephalopathy: Possibly subacute/chronic. Cerebral atrophy and chronic microvascular ischemia noted on head CT.  - Delirium precautions. - Monitor urine culture, though doesn't appear truly infected.   AFib: Rate controlled.  - Continue coreg - Continue xarelto for CHA2DS2-VASc score of 5-6.  Hypothyroidism: TSH 0.404.  - Continue synthroid.  Moderate protein calorie malnutrition:  - Dietitian consulted, supplement protein.   Macrocytosis: B12 and folate replete.   Leukocytosis: Mild, suspect due to hemoconcentration.  - Recheck in AM, monitor fever curve off abx.  DVT prophylaxis: Xarelto Code Status: DNR Family Communication: None at bedside this AM Disposition Plan: SNF  Consultants:   None  Procedures:   None  Antimicrobials:  None   Subjective: Pt confused, denies pain.   Objective: Vitals:   01/13/18 2121 01/14/18 0523 01/14/18 1023 01/14/18 1440  BP: (!) 171/91 (!) 191/98  (!) 156/96 114/61  Pulse: 93 (!) 104 (!) 108 77  Resp: 18 16  16   Temp: 97.7 F (36.5 C) 98.1 F (36.7 C)  98.3 F (36.8 C)  TempSrc: Oral Oral  Oral  SpO2: 99% 98%  97%  Weight:      Height:        Intake/Output Summary (Last 24 hours) at 01/14/2018 1627 Last data filed at 01/14/2018 1600 Gross per 24 hour  Intake 1000 ml  Output 300 ml  Net 700 ml   Filed Weights   01/13/18 1705  Weight: 49.9 kg    Gen: Thin, elderly female in no distress  Pulm: Non-labored breathing. Clear to auscultation bilaterally.  CV: Regular rate and rhythm. No murmur, rub, or gallop. No JVD, no pedal edema. GI: Abdomen soft, non-tender, non-distended, with normoactive bowel sounds. No organomegaly or masses felt. Ext: Warm, no deformities Skin: No rashes, lesions no ulcers Neuro: Alert and disoriented. No focal neurological deficits on limited exam. Psych: Judgement and insight appear normal. Mood & affect appropriate.   Data Reviewed: I have personally reviewed following labs and imaging studies  CBC: Recent Labs  Lab 01/08/18 1144 01/13/18 1246  WBC 5.7 12.0*  NEUTROABS 3.3 9.8*  HGB 13.2 13.6  HCT 35.2 39.2  MCV 101* 106.2*  PLT 253 735   Basic Metabolic Panel: Recent Labs  Lab 01/08/18 1144 01/13/18 1246 01/14/18 0301  NA 145* 140 141  K 4.2 3.4* 3.7  CL 101 102 109  CO2 26 29 24   GLUCOSE 118* 142* 117*  BUN 20 21 16   CREATININE 1.10* 0.95 0.97  CALCIUM 11.0* 10.8* 9.6   GFR:  Estimated Creatinine Clearance: 27.9 mL/min (by C-G formula based on SCr of 0.97 mg/dL). Liver Function Tests: Recent Labs  Lab 01/08/18 1144 01/13/18 1246  AST 26 55*  ALT 20 34  ALKPHOS 96 88  BILITOT 0.6 1.1  PROT 7.3 7.6  ALBUMIN 5.0* 4.4   No results for input(s): LIPASE, AMYLASE in the last 168 hours. No results for input(s): AMMONIA in the last 168 hours. Coagulation Profile: No results for input(s): INR, PROTIME in the last 168 hours. Cardiac Enzymes: Recent Labs  Lab  01/13/18 1246 01/14/18 0301  CKTOTAL 1,074* 703*   BNP (last 3 results) No results for input(s): PROBNP in the last 8760 hours. HbA1C: No results for input(s): HGBA1C in the last 72 hours. CBG: No results for input(s): GLUCAP in the last 168 hours. Lipid Profile: No results for input(s): CHOL, HDL, LDLCALC, TRIG, CHOLHDL, LDLDIRECT in the last 72 hours. Thyroid Function Tests: No results for input(s): TSH, T4TOTAL, FREET4, T3FREE, THYROIDAB in the last 72 hours. Anemia Panel: No results for input(s): VITAMINB12, FOLATE, FERRITIN, TIBC, IRON, RETICCTPCT in the last 72 hours. Urine analysis:    Component Value Date/Time   COLORURINE YELLOW 01/13/2018 1704   APPEARANCEUR HAZY (A) 01/13/2018 1704   LABSPEC 1.009 01/13/2018 1704   PHURINE 9.0 (H) 01/13/2018 1704   GLUCOSEU NEGATIVE 01/13/2018 1704   HGBUR SMALL (A) 01/13/2018 1704   BILIRUBINUR NEGATIVE 01/13/2018 1704   BILIRUBINUR negative 03/10/2017 1636   KETONESUR 5 (A) 01/13/2018 1704   PROTEINUR 30 (A) 01/13/2018 1704   UROBILINOGEN 0.2 03/10/2017 1636   UROBILINOGEN 0.2 02/17/2015 1822   NITRITE NEGATIVE 01/13/2018 1704   LEUKOCYTESUR NEGATIVE 01/13/2018 1704   Recent Results (from the past 240 hour(s))  Urine culture     Status: Abnormal (Preliminary result)   Collection Time: 01/13/18  5:04 PM  Result Value Ref Range Status   Specimen Description URINE, RANDOM  Final   Special Requests   Final    NONE Performed at [facility], [place] [address] [place], [place] [address]    Culture 20,000 COLONIES/mL ESCHERICHIA COLI (A)  Final   Report Status PENDING  Incomplete      Radiology Studies: Dg Elbow Complete Right  Result Date: 01/13/2018 CLINICAL DATA:  Laceration on the olecranon, unknown trauma EXAM: RIGHT ELBOW - COMPLETE 3+ VIEW COMPARISON:  None. FINDINGS: Alignment is normal. The elbow joint appears normal. No fracture is seen. No joint effusion is noted. Mild soft tissue prominence of the olecranon is  noted with calcification which could indicate chronic calcific bursitis. IMPRESSION: 1. No fracture or joint effusion. 2. Possible chronic calcific olecranon bursitis. Correlate clinically. Electronically Signed   By: Ivar Drape M.D.   On: 01/13/2018 16:09   Ct Head Wo Contrast  Result Date: 01/13/2018 CLINICAL DATA:  Fall EXAM: CT HEAD WITHOUT CONTRAST CT CERVICAL SPINE WITHOUT CONTRAST TECHNIQUE: Multidetector CT imaging of the head and cervical spine was performed following the standard protocol without intravenous contrast. Multiplanar CT image reconstructions of the cervical spine were also generated. COMPARISON:  CT head 02/17/2015 FINDINGS: CT HEAD FINDINGS Brain: Moderate atrophy with ventricular enlargement. Progression of atrophy. Chronic microvascular ischemic changes in the white matter. Negative for acute infarct, hemorrhage, or mass. Vascular: Negative for hyperdense vessel Skull: Negative for skull fracture Sinuses/Orbits: Mucosal edema and bony thickening of the right maxillary sinus. Mucosal edema right frontal and ethmoid sinus. Bilateral cataract surgery. Other: None CT CERVICAL SPINE FINDINGS Alignment: Mild anterolisthesis C4-5 and C5-6 Skull  base and vertebrae: Negative for fracture. Hemangioma C7 and T1 vertebral bodies. Soft tissues and spinal canal: Multinodular goiter. Diffuse atherosclerotic calcification Disc levels: Diffuse disc and facet degeneration throughout the cervical spine. Mild foraminal narrowing bilaterally at multiple levels. Spinal canal adequate in size. Upper chest: No acute abnormality. Other: None IMPRESSION: 1. Atrophy and chronic microvascular ischemia. No acute intracranial abnormality 2. Negative for cervical spine fracture. Moderate degenerative change. Electronically Signed   By: Franchot Gallo M.D.   On: 01/13/2018 15:11   Ct Cervical Spine Wo Contrast  Result Date: 01/13/2018 CLINICAL DATA:  Fall EXAM: CT HEAD WITHOUT CONTRAST CT CERVICAL SPINE WITHOUT  CONTRAST TECHNIQUE: Multidetector CT imaging of the head and cervical spine was performed following the standard protocol without intravenous contrast. Multiplanar CT image reconstructions of the cervical spine were also generated. COMPARISON:  CT head 02/17/2015 FINDINGS: CT HEAD FINDINGS Brain: Moderate atrophy with ventricular enlargement. Progression of atrophy. Chronic microvascular ischemic changes in the white matter. Negative for acute infarct, hemorrhage, or mass. Vascular: Negative for hyperdense vessel Skull: Negative for skull fracture Sinuses/Orbits: Mucosal edema and bony thickening of the right maxillary sinus. Mucosal edema right frontal and ethmoid sinus. Bilateral cataract surgery. Other: None CT CERVICAL SPINE FINDINGS Alignment: Mild anterolisthesis C4-5 and C5-6 Skull base and vertebrae: Negative for fracture. Hemangioma C7 and T1 vertebral bodies. Soft tissues and spinal canal: Multinodular goiter. Diffuse atherosclerotic calcification Disc levels: Diffuse disc and facet degeneration throughout the cervical spine. Mild foraminal narrowing bilaterally at multiple levels. Spinal canal adequate in size. Upper chest: No acute abnormality. Other: None IMPRESSION: 1. Atrophy and chronic microvascular ischemia. No acute intracranial abnormality 2. Negative for cervical spine fracture. Moderate degenerative change. Electronically Signed   By: Franchot Gallo M.D.   On: 01/13/2018 15:11   Dg Chest Portable 1 View  Result Date: 01/13/2018 CLINICAL DATA:  [place] today. History of atrial fibrillation. No current chest complaints. EXAM: PORTABLE CHEST 1 VIEW COMPARISON:  Portable chest x-ray dated February 17, 2015 FINDINGS: The lungs are mildly hyperinflated. The interstitial markings are coarse. There is no alveolar infiltrate or pleural effusion. There is no pneumothorax. The cardiac silhouette is chronically enlarged. The pulmonary vascularity is normal. There is calcification in the wall of the aortic  arch. The bony thorax exhibits no acute abnormality. IMPRESSION: Chronic bronchitic changes. Stable cardiomegaly. There is no acute cardiopulmonary abnormality. Thoracic aortic atherosclerosis. Electronically Signed   By: David  Martinique M.D.   On: 01/13/2018 13:30    Scheduled Meds: . brimonidine  1 drop Both Eyes TID  . cholecalciferol  2,000 Units Oral Daily  . docusate sodium  100 mg Oral BID  . dorzolamide  1 drop Both Eyes Daily  . feeding supplement (ENSURE ENLIVE)  237 mL Oral BID BM  . levothyroxine  50 mcg Oral QAC breakfast  . metoprolol tartrate  100 mg Oral Daily  . multivitamin  1 tablet Oral Daily  . potassium chloride  10 mEq Oral Daily  . Rivaroxaban  15 mg Oral Q supper  . timolol  1 drop Both Eyes BID   Continuous Infusions:   LOS: 0 days   Time spent: 25 minutes.  Patrecia Pour, MD [facility] [email or web address] Password [facility] 01/14/2018, 4:27 PM

## 2018-01-14 NOTE — Evaluation (Signed)
Physical Therapy Evaluation Patient Details Name: Brianna Thompson MRN: [id number] DOB: 21-Nov-1921 Today's Date: 01/14/2018   History of Present Illness  Pt is a 84 y.o. F with significant PMH of atrial fibrillation, osteopenia, skin cancer, hip fracture, hypothyroidism, who presents after a fall at home. Per chart review, she was found on the floor by her daughter in law. Imaging negative for acute abnormality or fracture.   Clinical Impression  Pt admitted with above diagnosis. Pt currently with functional limitations due to the deficits listed below (see PT Problem List). No family/caregiver present to determine PLOF or home set up; per chart review, patient lives alone. Patient is pleasantly confused and alert and oriented to self only; following simple commands consistently. Patient currently presenting with decreased functional mobility secondary to cognitive impairments, balance deficits, and generalized weakness. Ambulating bed to chair with minimal assistance needed to steady. Would likely improve with use of assistive device. Presenting as a high fall risk secondary to history of falls and decreased gait speed. Recommending SNF to maximize functional independence. Pt will benefit from skilled PT to increase their independence and safety with mobility to allow discharge to the venue listed below.       Follow Up Recommendations SNF;Supervision/Assistance - 24 hour    Equipment Recommendations  Other (comment)(defer to next venue)    Recommendations for Other Services       Precautions / Restrictions Precautions Precautions: Fall Restrictions Weight Bearing Restrictions: No      Mobility  Bed Mobility Overal bed mobility: Needs Assistance Bed Mobility: Supine to Sit     Supine to sit: Min guard     General bed mobility comments: Min guard for safety  Transfers Overall transfer level: Needs assistance Equipment used: None Transfers: Sit to/from Stand Sit to Stand: Min  assist         General transfer comment: Light min assist to boost up to standing  Ambulation/Gait Ambulation/Gait assistance: Min assist   Assistive device: None Gait Pattern/deviations: Step-through pattern;Decreased stride length;Narrow base of support;Trunk flexed Gait velocity: decr Gait velocity interpretation: <1.31 ft/sec, indicative of household ambulator General Gait Details: Patient performing ambulatory steps from bed to chair, reaching for objects for external support.   Stairs            Wheelchair Mobility    Modified Rankin (Stroke Patients Only)       Balance Overall balance assessment: Needs assistance Sitting-balance support: No upper extremity supported;Feet supported Sitting balance-Leahy Scale: Good     Standing balance support: No upper extremity supported;During functional activity Standing balance-Leahy Scale: Fair                               Pertinent Vitals/Pain Pain Assessment: No/denies pain    Home Living Family/patient expects to be discharged to:: Unsure                 Additional Comments: Per chart review, pt lives home alone and daughter in law nearby. When asked, patient states, "I live with my stepmother and father."    Prior Function Level of Independence: Needs assistance   Gait / Transfers Assistance Needed: Likely using assistive device     Comments: Pt is an unreliable historian. States she walks independently but likely uses a cane or walker due to tendency to furniture walk     Hand Dominance        Extremity/Trunk Assessment   Upper Extremity Assessment  Upper Extremity Assessment: Generalized weakness    Lower Extremity Assessment Lower Extremity Assessment: Generalized weakness    Cervical / Trunk Assessment Cervical / Trunk Assessment: Kyphotic  Communication   Communication: No difficulties  Cognition Arousal/Alertness: Awake/alert Behavior During Therapy: WFL for tasks  assessed/performed Overall Cognitive Status: No family/caregiver present to determine baseline cognitive functioning                                 General Comments: Patient is pleasantly confused, currently A&Ox1, oriented to self only. States she is at "[employer]." Follows simple commands with increased time.       General Comments      Exercises     Assessment/Plan    PT Assessment Patient needs continued PT services  PT Problem List Decreased strength;Decreased activity tolerance;Decreased balance;Decreased mobility;Decreased cognition;Decreased safety awareness       PT Treatment Interventions DME instruction;Gait training;Functional mobility training;Therapeutic activities;Therapeutic exercise;Balance training;Patient/family education    PT Goals (Current goals can be found in the Care Plan section)  Acute Rehab PT Goals Patient Stated Goal: did not state; agreeable to physical therapy evaluation PT Goal Formulation: With patient Time For Goal Achievement: 01/28/18 Potential to Achieve Goals: Fair    Frequency Min 3X/week   Barriers to discharge Decreased caregiver support      Co-evaluation               AM-PAC PT "6 Clicks" Daily Activity  Outcome Measure Difficulty turning over in bed (including adjusting bedclothes, sheets and blankets)?: None Difficulty moving from lying on back to sitting on the side of the bed? : A Little Difficulty sitting down on and standing up from a chair with arms (e.g., wheelchair, bedside commode, etc,.)?: Unable Help needed moving to and from a bed to chair (including a wheelchair)?: A Little Help needed walking in hospital room?: A Little Help needed climbing 3-5 steps with a railing? : A Lot 6 Click Score: 16    End of Session Equipment Utilized During Treatment: Gait belt Activity Tolerance: Patient tolerated treatment well Patient left: in chair;with call bell/phone within reach;with chair  alarm set Nurse Communication: Mobility status PT Visit Diagnosis: Unsteadiness on feet (R26.81);Muscle weakness (generalized) (M62.81);History of falling (Z91.81)    Time: 2956-2130 PT Time Calculation (min) (ACUTE ONLY): 19 min   Charges:   PT Evaluation $PT Eval Moderate Complexity: 1 Mod        Brianna Thompson, [place], DPT Acute Rehabilitation Services Pager [phone] Office [phone]   Willy Eddy 01/14/2018, 11:19 AM

## 2018-01-15 DIAGNOSIS — W19XXXA Unspecified fall, initial encounter: Secondary | ICD-10-CM | POA: Diagnosis not present

## 2018-01-15 DIAGNOSIS — I4891 Unspecified atrial fibrillation: Secondary | ICD-10-CM | POA: Diagnosis not present

## 2018-01-15 DIAGNOSIS — Z66 Do not resuscitate: Secondary | ICD-10-CM | POA: Diagnosis not present

## 2018-01-15 DIAGNOSIS — E039 Hypothyroidism, unspecified: Secondary | ICD-10-CM

## 2018-01-15 DIAGNOSIS — G309 Alzheimer's disease, unspecified: Secondary | ICD-10-CM | POA: Diagnosis not present

## 2018-01-15 DIAGNOSIS — H409 Unspecified glaucoma: Secondary | ICD-10-CM | POA: Diagnosis not present

## 2018-01-15 DIAGNOSIS — R05 Cough: Secondary | ICD-10-CM | POA: Diagnosis not present

## 2018-01-15 DIAGNOSIS — E440 Moderate protein-calorie malnutrition: Secondary | ICD-10-CM | POA: Diagnosis not present

## 2018-01-15 DIAGNOSIS — R51 Headache: Secondary | ICD-10-CM | POA: Diagnosis not present

## 2018-01-15 DIAGNOSIS — Z7401 Bed confinement status: Secondary | ICD-10-CM | POA: Diagnosis not present

## 2018-01-15 DIAGNOSIS — R748 Abnormal levels of other serum enzymes: Secondary | ICD-10-CM | POA: Diagnosis not present

## 2018-01-15 DIAGNOSIS — R2689 Other abnormalities of gait and mobility: Secondary | ICD-10-CM | POA: Diagnosis not present

## 2018-01-15 DIAGNOSIS — D7589 Other specified diseases of blood and blood-forming organs: Secondary | ICD-10-CM | POA: Diagnosis not present

## 2018-01-15 DIAGNOSIS — F0390 Unspecified dementia without behavioral disturbance: Secondary | ICD-10-CM | POA: Diagnosis not present

## 2018-01-15 DIAGNOSIS — R410 Disorientation, unspecified: Secondary | ICD-10-CM | POA: Diagnosis not present

## 2018-01-15 DIAGNOSIS — Z7901 Long term (current) use of anticoagulants: Secondary | ICD-10-CM | POA: Diagnosis not present

## 2018-01-15 DIAGNOSIS — I10 Essential (primary) hypertension: Secondary | ICD-10-CM | POA: Diagnosis not present

## 2018-01-15 DIAGNOSIS — M8580 Other specified disorders of bone density and structure, unspecified site: Secondary | ICD-10-CM | POA: Diagnosis not present

## 2018-01-15 DIAGNOSIS — Z9181 History of falling: Secondary | ICD-10-CM | POA: Diagnosis not present

## 2018-01-15 DIAGNOSIS — K5900 Constipation, unspecified: Secondary | ICD-10-CM | POA: Diagnosis not present

## 2018-01-15 DIAGNOSIS — M2550 Pain in unspecified joint: Secondary | ICD-10-CM | POA: Diagnosis not present

## 2018-01-15 DIAGNOSIS — R2681 Unsteadiness on feet: Secondary | ICD-10-CM | POA: Diagnosis not present

## 2018-01-15 DIAGNOSIS — M1990 Unspecified osteoarthritis, unspecified site: Secondary | ICD-10-CM | POA: Diagnosis not present

## 2018-01-15 DIAGNOSIS — W19XXXD Unspecified fall, subsequent encounter: Secondary | ICD-10-CM | POA: Diagnosis not present

## 2018-01-15 DIAGNOSIS — H3530 Unspecified macular degeneration: Secondary | ICD-10-CM | POA: Diagnosis not present

## 2018-01-15 DIAGNOSIS — E860 Dehydration: Secondary | ICD-10-CM | POA: Diagnosis not present

## 2018-01-15 DIAGNOSIS — G9340 Encephalopathy, unspecified: Secondary | ICD-10-CM | POA: Diagnosis not present

## 2018-01-15 DIAGNOSIS — I700 Atherosclerosis of aorta: Secondary | ICD-10-CM | POA: Diagnosis not present

## 2018-01-15 DIAGNOSIS — R52 Pain, unspecified: Secondary | ICD-10-CM | POA: Diagnosis not present

## 2018-01-15 DIAGNOSIS — M6282 Rhabdomyolysis: Secondary | ICD-10-CM | POA: Diagnosis not present

## 2018-01-15 DIAGNOSIS — D72829 Elevated white blood cell count, unspecified: Secondary | ICD-10-CM | POA: Diagnosis not present

## 2018-01-15 LAB — CBC
HCT: 34.6 % — ABNORMAL LOW (ref 36.0–46.0)
Hemoglobin: 11.4 g/dL — ABNORMAL LOW (ref 12.0–15.0)
MCH: 34.8 pg — ABNORMAL HIGH (ref 26.0–34.0)
MCHC: 32.9 g/dL (ref 30.0–36.0)
MCV: 105.5 fL — ABNORMAL HIGH (ref 78.0–100.0)
PLATELETS: 191 10*3/uL (ref 150–400)
RBC: 3.28 MIL/uL — ABNORMAL LOW (ref 3.87–5.11)
RDW: 14.1 % (ref 11.5–15.5)
WBC: 7.8 10*3/uL (ref 4.0–10.5)

## 2018-01-15 MED ORDER — ENSURE ENLIVE PO LIQD: 237.0000 mL | Freq: Two times a day (BID) | ORAL | 12 refills | Status: AC

## 2018-01-15 MED ORDER — POTASSIUM CHLORIDE CRYS ER 10 MEQ PO TBCR: 10.0000 meq | EXTENDED_RELEASE_TABLET | Freq: Every day | ORAL | Status: DC

## 2018-01-15 MED ORDER — TRAMADOL HCL 50 MG PO TABS: 25.0000 mg | ORAL_TABLET | Freq: Four times a day (QID) | ORAL | 0 refills | Status: AC | PRN

## 2018-01-15 MED ORDER — POLYETHYLENE GLYCOL 3350 17 G PO PACK: 17.0000 g | PACK | Freq: Every day | ORAL | 0 refills | Status: AC | PRN

## 2018-01-15 NOTE — Progress Notes (Addendum)
Patient will DC to: [facility] Anticipated DC date: 01/15/18 Family notified: POA [name] Transport by: Corey Harold   Per MD patient ready for DC to [facility]. RN, patient, patient's family, and facility notified of DC. Discharge Summary and FL2 sent to facility. RN to call report prior to discharge [phone] Room 101B). DC packet on chart. Ambulance transport requested for patient.   CSW will sign off for now as social work intervention is no longer needed. Please consult [place] again if new needs arise.  Cedric Fishman, LCSW Clinical Social Worker [phone]

## 2018-01-15 NOTE — Clinical Social Work Note (Signed)
Clinical Social Work Assessment  Patient Details  Name: Brianna Thompson MRN: [id number] Date of Birth: Sep 23, 1921  Date of referral:  01/15/18               Reason for consult:  Facility Placement                Permission sought to share information with:  Facility Sport and exercise psychologist, Family Supports Permission granted to share information::  No  Name::     [place] Section::  SNFs  Relationship::  Daughter in [place]:  [phone]  Housing/Transportation Living arrangements for the past 2 months:  [place] of Information:  Power of Attorney Patient Interpreter Needed:  None Criminal Activity/Legal Involvement Pertinent to Current Situation/Hospitalization:  No - Comment as needed Significant Relationships:  Other Family Members Lives with:  Self Do you feel safe going back to the place where you live?  No Need for family participation in patient care:  Yes (Comment)  Care giving concerns:  CSW received consult for possible SNF placement at time of discharge. CSW spoke with patient's POA, [name], regarding PT recommendation of SNF placement at time of discharge. Patient reported that patient lives alone and she is currently unable to care for patient at their home given patient's current physical needs and fall risk. Patient's POA expressed understanding of PT recommendation and is agreeable to SNF placement at time of discharge. CSW to continue to follow and assist with discharge planning needs.   Social Worker assessment / plan:  CSW spoke with patient's POA concerning possibility of rehab at [facility] before returning home.  Employment status:  Retired Nurse, adult PT Recommendations:  [place] / Referral to community resources:  [place]  Patient/Family's Response to care:  Patient's POA,  daughter in Sports coach, recognizes need for rehab before returning home and is agreeable to  a SNF in [place]. POA reported preference for [facility] since she has been there before.  Patient/Family's Understanding of and Emotional Response to Diagnosis, Current Treatment, and Prognosis:  Patient/family is realistic regarding therapy needs and expressed being hopeful for SNF placement. POA expressed understanding of CSW role and discharge process as well as medical condition. No questions/concerns about plan or treatment.    Emotional Assessment Appearance:  Appears stated age Attitude/Demeanor/Rapport:  Unable to Assess Affect (typically observed):  Unable to Assess Orientation:  Oriented to Self, Oriented to Place, Oriented to Situation Alcohol / Substance use:  Not Applicable Psych involvement (Current and /or in the community):  No (Comment)  Discharge Needs  Concerns to be addressed:  Care Coordination Readmission within the last 30 days:  No Current discharge risk:  None Barriers to Discharge:  No Barriers Identified   Benard Halsted, LCSW 01/15/2018, 11:37 AM

## 2018-01-15 NOTE — Evaluation (Signed)
Occupational Therapy Evaluation Patient Details Name: Brianna Thompson MRN: [id number] DOB: 1922/03/27 Today's Date: 01/15/2018    History of Present Illness Pt is a 84 y.o. female admitted from home on 01/13/18 after falling at home and being found by daughter-in-law; mild rhabdomyolysis. Imaging negative for acute abnormality or fx. PMH includes a-fib, osteopenia, L hip fx.    Clinical Impression   This 84 yo female admitted with above presents to acute OT with decreased balance thus affecting her PLOF of being totally independent at home with her basic ADLs. Currently pt is min-Mod A for basic ADLs.Marland Kitchen She will benefit from acute OT with follow up OT at SNF. We will continue to follow.    Follow Up Recommendations  SNF;Supervision/Assistance - 24 hour    Equipment Recommendations  None recommended by OT       Precautions / Restrictions Precautions Precautions: Fall Restrictions Weight Bearing Restrictions: No      Mobility Bed Mobility Overal bed mobility: Needs Assistance Bed Mobility: Supine to Sit     Supine to sit: Min assist;HOB elevated     General bed mobility comments: use of rail  Transfers Overall transfer level: Needs assistance Equipment used: Rolling walker (2 wheeled) Transfers: Sit to/from Stand Sit to Stand: Min assist         General transfer comment: Able to sit<>stand with min A, but needed Mod A to maintain standing for peri care    Balance Overall balance assessment: Needs assistance Sitting-balance support: No upper extremity supported;Feet supported Sitting balance-Leahy Scale: Fair     Standing balance support: Bilateral upper extremity supported;During functional activity Standing balance-Leahy Scale: Poor Standing balance comment: reliant on RW and additional external support                           ADL either performed or assessed with clinical judgement   ADL Overall ADL's : Needs assistance/impaired Eating/Feeding:  Set up Eating/Feeding Details (indicate cue type and reason): supported sitting in recliner Grooming: Set up;Supervision/safety Grooming Details (indicate cue type and reason): supported sitting in recliner Upper Body Bathing: Supervision/ safety;Set up Upper Body Bathing Details (indicate cue type and reason): supported sitting in recliner Lower Body Bathing: Moderate assistance Lower Body Bathing Details (indicate cue type and reason): Min A sit<>stand, Mod A to maintain standing (posterior bias) Upper Body Dressing : Minimal assistance Upper Body Dressing Details (indicate cue type and reason): supported sitting in recliner Lower Body Dressing: Moderate assistance Lower Body Dressing Details (indicate cue type and reason): Min A sit<>stand, Mod A to maintain standing (posterior bias) Toilet Transfer: Ambulation;RW Armed forces technical officer Details (indicate cue type and reason): bed>around to recliner Toileting- Clothing Manipulation and Hygiene: Total assistance Toileting - Clothing Manipulation Details (indicate cue type and reason): Min A sit<>stand, Mod A to maintain standing (posterior bias)             Vision Patient Visual Report: No change from baseline              Pertinent Vitals/Pain Pain Assessment: Faces Faces Pain Scale: Hurts a little bit Pain Location: L thigh into knee Pain Descriptors / Indicators: Sore Pain Intervention(s): Monitored during session     Hand Dominance Right   Extremity/Trunk Assessment Upper Extremity Assessment Upper Extremity Assessment: Generalized weakness           Communication Communication Communication: No difficulties   Cognition Arousal/Alertness: Awake/alert Behavior During Therapy: WFL for tasks assessed/performed Overall Cognitive Status:  History of cognitive impairments - at baseline Area of Impairment: Orientation;Attention;Memory;Following commands;Safety/judgement;Awareness;Problem solving                  Orientation Level: Disoriented to;Time;Situation;Place Current Attention Level: Sustained Memory: Decreased short-term memory Following Commands: Follows one step commands with increased time Safety/Judgement: Decreased awareness of safety;Decreased awareness of deficits Awareness: Intellectual Problem Solving: Slow processing General Comments: History of dementia. Daughter-in-law present and reports this is near baseline cognition   General Comments  Daughter-in-law present during session            Raymond expects to be discharged to:: Skilled nursing facility                                 Additional Comments: Pt lives home alone and daughter in law nearby.      Prior Functioning/Environment Level of Independence: Independent with assistive device(s)  Gait / Transfers Assistance Needed: uses SPC or RW     Comments: baths and dresses self, uses Depends for incontience        OT Problem List: Impaired balance (sitting and/or standing)      OT Treatment/Interventions: Self-care/ADL training;Balance training;DME and/or AE instruction;Patient/family education    OT Goals(Current goals can be found in the care plan section) Acute Rehab OT Goals Patient Stated Goal: Rehab at SNF with transition to LTC OT Goal Formulation: With patient/family Time For Goal Achievement: 01/29/18 Potential to Achieve Goals: Good  OT Frequency: Min 2X/week   Barriers to D/C: Decreased caregiver support             AM-PAC PT "6 Clicks" Daily Activity     Outcome Measure Help from another person eating meals?: None Help from another person taking care of personal grooming?: A Little Help from another person toileting, which includes using toliet, bedpan, or urinal?: A Lot Help from another person bathing (including washing, rinsing, drying)?: A Little Help from another person to put on and taking off regular upper body clothing?: A Little Help from  another person to put on and taking off regular lower body clothing?: A Lot 6 Click Score: 17   End of Session Equipment Utilized During Treatment: Surveyor, mining Communication: (NT: pt needs purewick replaced)  Activity Tolerance: Patient tolerated treatment well Patient left: in chair;with chair alarm set  OT Visit Diagnosis: Unsteadiness on feet (R26.81);Other abnormalities of gait and mobility (R26.89);History of falling (Z91.81)                Time: 6333-5456 OT Time Calculation (min): 32 min Charges:  OT General Charges $OT Visit: 1 Visit OT Evaluation $OT Eval Moderate Complexity: 1 Mod OT Treatments $Self Care/Home Management : 8-22 mins  [place]en Circle, OTR/L Acute [employer] Pager [phone] Office [phone]

## 2018-01-15 NOTE — Progress Notes (Signed)
Nsg Discharge Note  Admit Date:  01/13/2018 Discharge date: 01/15/2018   Tanna Furry to be D/C'd Skilled nursing facility per MD order.  AVS completed.  Copy for chart, and copy for patient signed, and dated. Patient/caregiver able to verbalize understanding.   Discharge Assessment: Vitals:   01/15/18 0628 01/15/18 0950  BP: (!) 155/78 (!) 169/96  Pulse: 89 91  Resp: 18   Temp: (!) 97.5 F (36.4 C)   SpO2: 97%    Skin clean, dry and intact without evidence of skin break down, no evidence of skin tears noted. IV catheter discontinued intact. Site without signs and symptoms of complications - no redness or edema noted at insertion site, patient denies c/o pain - only slight tenderness at site.  Dressing with slight pressure applied.  D/c Instructions-Education: Discharge instructions given to patient/family with verbalized understanding. D/c education completed with patient/family including follow up instructions, medication list, d/c activities limitations if indicated, with other d/c instructions as indicated by MD - patient able to verbalize understanding, all questions fully answered. Patient instructed to return to ED, call 911, or call MD for any changes in condition.  Patient escorted via [place], and D/C home via private auto.  Hiram Comber, RN 01/15/2018 2:08 PM

## 2018-01-15 NOTE — Progress Notes (Signed)
Physical Therapy Treatment Patient Details Name: Brianna Thompson MRN: [id number] DOB: 03-26-1922 Today's Date: 01/15/2018    History of Present Illness Pt is a 84 y.o. female admitted from home on 01/13/18 after falling at home and being found by daughter-in-law; mild rhabdomyolysis. Imaging negative for acute abnormality or fx. PMH includes a-fib, osteopenia, L hip fx.    PT Comments    Pt progressing with mobility. Today's session focused on LE strengthening and balance, pt able to perform repeated sit<>stands and standing activity with RW, requiring minA to maintain balance. Pt with posterior lean and poor ability to self-correct LOB. Continue to recommend SNF-level therapies.    Follow Up Recommendations  SNF;Supervision/Assistance - 24 hour     Equipment Recommendations  (TBD next venue)    Recommendations for Other Services       Precautions / Restrictions Precautions Precautions: Fall Restrictions Weight Bearing Restrictions: No    Mobility  Bed Mobility Overal bed mobility: Needs Assistance Bed Mobility: Supine to Sit     Supine to sit: Mod assist     General bed mobility comments: Received sitting in recliner  Transfers Overall transfer level: Needs assistance Equipment used: Rolling walker (2 wheeled) Transfers: Sit to/from Stand Sit to Stand: Min guard         General transfer comment: Performed at 10x total sit<>stands with min guard, heavy reliance on UE support to push into standing; intermittent cues for correct hand placement  Ambulation/Gait Ambulation/Gait assistance: Min assist   Assistive device: Rolling walker (2 wheeled) Gait Pattern/deviations: Leaning posteriorly     General Gait Details: Performed repeated bouts of pre-gait activity (marching) in front of recliner with RW and intermittent minA to prevent posterior LOB   Stairs             Wheelchair Mobility    Modified Rankin (Stroke Patients Only)       Balance  Overall balance assessment: Needs assistance Sitting-balance support: No upper extremity supported;Feet supported Sitting balance-Leahy Scale: Fair     Standing balance support: No upper extremity supported;During functional activity Standing balance-Leahy Scale: Poor                              Cognition Arousal/Alertness: Awake/alert Behavior During Therapy: WFL for tasks assessed/performed Overall Cognitive Status: History of cognitive impairments - at baseline Area of Impairment: Orientation;Attention;Memory;Following commands;Safety/judgement;Awareness;Problem solving                 Orientation Level: Disoriented to;Time;Situation;Place Current Attention Level: Sustained Memory: Decreased short-term memory Following Commands: Follows one step commands with increased time Safety/Judgement: Decreased awareness of safety;Decreased awareness of deficits Awareness: Intellectual Problem Solving: Slow processing General Comments: History of dementia. Daughter-in-law present and reports this is near baseline cognition      Exercises      General Comments General comments (skin integrity, edema, etc.): Daughter-in-law present during session      Pertinent Vitals/Pain Pain Assessment: Faces Faces Pain Scale: Hurts a little bit Pain Location: L thigh into knee Pain Descriptors / Indicators: Sore Pain Intervention(s): Monitored during session    Home Living Family/patient expects to be discharged to:: Skilled nursing facility               Additional Comments: Pt lives home alone and daughter in law nearby.    Prior Function Level of Independence: Independent with assistive device(s)  Gait / Transfers Assistance Needed: uses SPC or RW   Comments:  baths and dresses self, uses Depends for incontience   PT Goals (current goals can now be found in the care plan section) Acute Rehab PT Goals Patient Stated Goal: Rehab at SNF with transition to LTC PT  Goal Formulation: With family Time For Goal Achievement: 01/28/18 Potential to Achieve Goals: Good Progress towards PT goals: Progressing toward goals    Frequency    Min 2X/week      PT Plan Frequency needs to be updated    Co-evaluation              AM-PAC PT "6 Clicks" Daily Activity  Outcome Measure  Difficulty turning over in bed (including adjusting bedclothes, sheets and blankets)?: None Difficulty moving from lying on back to sitting on the side of the bed? : Unable Difficulty sitting down on and standing up from a chair with arms (e.g., wheelchair, bedside commode, etc,.)?: Unable Help needed moving to and from a bed to chair (including a wheelchair)?: A Little Help needed walking in hospital room?: A Little Help needed climbing 3-5 steps with a railing? : A Lot 6 Click Score: 14    End of Session Equipment Utilized During Treatment: Gait belt Activity Tolerance: Patient tolerated treatment well Patient left: in chair;with call bell/phone within reach;with chair alarm set;with family/visitor present Nurse Communication: Mobility status PT Visit Diagnosis: Unsteadiness on feet (R26.81);Muscle weakness (generalized) (M62.81);History of falling (Z91.81)     Time: 4401-0272 PT Time Calculation (min) (ACUTE ONLY): 23 min  Charges:  $Therapeutic Exercise: 8-22 mins $Therapeutic Activity: 8-22 mins                    Mabeline Caras, PT, DPT Acute Rehabilitation Services  Pager [phone] Office [place] 01/15/2018, 3:00 PM

## 2018-01-15 NOTE — Discharge Summary (Signed)
PATIENT DETAILS Name: Brianna Thompson Age: 84 y.o. Sex: female Date of Birth: 08/21/1921 MRN: [id number]. Admitting Physician: Shelbie Proctor, MD [id number], Neoma Laming, DO  Admit Date: 01/13/2018 Discharge date: 01/15/2018  Recommendations for Outpatient Follow-up:  1. Follow up with PCP in 1-2 weeks 2. Please obtain BMP/CBC/CK in one week 3. Statin on hold due to mild rhabdomyolysis-if CK normal next week-suspect can be resumed 4. If continues to have frequent falls-may need to stop anticoagulation permanently.   Admitted From:  Home  Disposition: SNF   Home Health: No  Equipment/Devices: None  Discharge Condition: Stable  CODE STATUS:  DNR  Diet recommendation:  Heart Healthy   Brief Summary: See H&P, Labs, Consult and Test reports for all details in brief, patient is a 84 year old female who apparently lives alone-she has a history of A. fib, hypothyroidism who was found down by her daughter-in-law.  She was found to be slightly confused and with gait instability.  She was brought to the emergency room, where numerous imaging studies were negative for fractures.  She was found to have mild rhabdomyolysis and started on IV fluids.  She was subsequently admitted to the hospitalist service.  See below for further details  Brief Hospital Course: Mild rhabdomyolysis: Secondary to mechanical fall-improved with IV fluids.  Recheck CK in 1 week.  Continue to hold statins on discharge.  If CK levels improved further-suspect it could be resumed.  Fall: Poor historian-suspect this was a mechanical fall-unsure if this was a syncopal episode.  In any event patient is frail-84 year old female-apart from imaging studies I do not think any further work-up is required at this point.  Since she is being discharged to a SNF-anticoagulation is being cautiously continued.  However if she has frequent falls in the future, this may need to be permanently discontinued.  Will defer  further to her outpatient physicians.  Acute encephalopathy: Suspect she may have mild cognitive dysfunction/dementia baseline-this morning she answers most of my questions appropriately at times-but still appears to be slightly confused.  Although she has E. coli positive in the urine cultures-I suspect this is asymptomatic bacteriuria rather than a true infection-hence he has not been started on any antimicrobial therapy.  We will continue standard delirium precautions at SNF.  CT head did not show any acute abnormalities-it did show cerebral atrophy and chronic microvascular ischemia  Atrial fibrillation: Rate controlled-continue beta-blocker and Xarelto.  See above regarding plans to cautiously continue with anticoagulation.  Mild leukocytosis: Has resolved-could be reactive or from mild inflammation due to rhabdomyolysis.  Do not think she has a urinary tract infection-think she has asymptomatic bacteriuria-hence no role for antimicrobial therapy.  Hypothyroidism: Continue Synthroid-recheck TSH in 3 months  Moderate protein calorie malnutrition: Continue supplements  Macrocytosis: Vitamin B12/folate levels within normal limits-continue to follow in the outpatient setting  Procedures/Studies: None  Discharge Diagnoses:  Principal Problem:   Fall Active Problems:   h/o Atrial fibrillation ([place]), CHA2DS2-VASc Score 5-6   HTN (hypertension)   Hypothyroidism   Elevated CPK   Chronic anticoagulation   Malnutrition of moderate degree   Discharge Instructions:  Activity:  As tolerated with Full fall precautions use walker/cane & assistance as needed   Discharge Instructions    Diet - low sodium heart healthy   Complete by:  As directed    Increase activity slowly   Complete by:  As directed      Allergies as of 01/15/2018      Reactions   Calcium-containing Compounds Other (  See Comments)   constipation      Medication List    STOP taking these medications   lovastatin  20 MG tablet Commonly known as:  MEVACOR     TAKE these medications   acetaminophen 325 MG tablet Commonly known as:  TYLENOL Take 2 tablets (650 mg total) by mouth every 6 (six) hours as needed for mild pain, moderate pain, fever or headache.   BESIVANCE 0.6 % Susp Generic drug:  Besifloxacin HCl Place 1 drop into the left eye every 30 (thirty) days.   brimonidine 0.2 % ophthalmic solution Commonly known as:  ALPHAGAN Place 1 drop into both eyes 3 (three) times daily.   docusate sodium 100 MG capsule Commonly known as:  COLACE Take 1 capsule (100 mg total) by mouth 2 (two) times daily.   dorzolamide 2 % ophthalmic solution Commonly known as:  TRUSOPT Place 1 drop into both eyes daily.   feeding supplement (ENSURE ENLIVE) Liqd Take 237 mLs by mouth 2 (two) times daily between meals.   furosemide 20 MG tablet Commonly known as:  LASIX TAKE 1 TABLET (20 MG TOTAL) BY MOUTH DAILY.   levothyroxine 50 MCG tablet Commonly known as:  SYNTHROID, LEVOTHROID TAKE 1 TABLET BY MOUTH DAILY BEFORE BREAKFAST. What changed:    how much to take  how to take this  when to take this  additional instructions   metoprolol tartrate 100 MG tablet Commonly known as:  LOPRESSOR Take 1 tablet (100 mg total) by mouth daily.   polyethylene glycol packet Commonly known as:  MIRALAX / GLYCOLAX Take 17 g by mouth daily as needed for mild constipation.   potassium chloride 10 MEQ tablet Commonly known as:  K-DUR Take 1 tablet (10 mEq total) by mouth daily.   PRESERVISION AREDS 2 Caps Take 1 capsule by mouth 2 (two) times daily.   Rivaroxaban 15 MG Tabs tablet Commonly known as:  XARELTO TAKE 1 TABLET BY MOUTH DAILY WITH SUPPER What changed:    how much to take  how to take this  when to take this  additional instructions   timolol 0.5 % ophthalmic solution Commonly known as:  BETIMOL Place 1 drop into both eyes 2 (two) times daily.   traMADol 50 MG tablet Commonly known  as:  ULTRAM Take 0.5 tablets (25 mg total) by mouth every 6 (six) hours as needed for moderate pain.   VITAMIN D3 SUPER STRENGTH 2000 units Caps Generic drug:  Cholecalciferol Take 2,000 Units by mouth daily.       Contact information for follow-up providers    Mellody Dance, DO. Schedule an appointment as soon as possible for a visit in 1 week(s).   Specialty:  Family Medicine Contact information: [place] [address] [phone]            Contact information for after-discharge care    Destination    [facility] Preferred SNF .   Service:  Skilled Nursing Contact information: [place] [phone]                 Allergies  Allergen Reactions  . Calcium-Containing Compounds Other (See Comments)    constipation    Consultations:   None   Other Procedures/Studies: Dg Elbow Complete Right  Result Date: 01/13/2018 CLINICAL DATA:  Laceration on the olecranon, unknown trauma EXAM: RIGHT ELBOW - COMPLETE 3+ VIEW COMPARISON:  None. FINDINGS: Alignment is normal. The elbow joint appears normal.  No fracture is seen. No joint effusion is noted. Mild soft tissue prominence of the olecranon is noted with calcification which could indicate chronic calcific bursitis. IMPRESSION: 1. No fracture or joint effusion. 2. Possible chronic calcific olecranon bursitis. Correlate clinically. Electronically Signed   By: Ivar Drape M.D.   On: 01/13/2018 16:09   Ct Head Wo Contrast  Result Date: 01/13/2018 CLINICAL DATA:  Fall EXAM: CT HEAD WITHOUT CONTRAST CT CERVICAL SPINE WITHOUT CONTRAST TECHNIQUE: Multidetector CT imaging of the head and cervical spine was performed following the standard protocol without intravenous contrast. Multiplanar CT image reconstructions of the cervical spine were also generated. COMPARISON:  CT head 02/17/2015 FINDINGS: CT HEAD FINDINGS Brain: Moderate atrophy with  ventricular enlargement. Progression of atrophy. Chronic microvascular ischemic changes in the white matter. Negative for acute infarct, hemorrhage, or mass. Vascular: Negative for hyperdense vessel Skull: Negative for skull fracture Sinuses/Orbits: Mucosal edema and bony thickening of the right maxillary sinus. Mucosal edema right frontal and ethmoid sinus. Bilateral cataract surgery. Other: None CT CERVICAL SPINE FINDINGS Alignment: Mild anterolisthesis C4-5 and C5-6 Skull base and vertebrae: Negative for fracture. Hemangioma C7 and T1 vertebral bodies. Soft tissues and spinal canal: Multinodular goiter. Diffuse atherosclerotic calcification Disc levels: Diffuse disc and facet degeneration throughout the cervical spine. Mild foraminal narrowing bilaterally at multiple levels. Spinal canal adequate in size. Upper chest: No acute abnormality. Other: None IMPRESSION: 1. Atrophy and chronic microvascular ischemia. No acute intracranial abnormality 2. Negative for cervical spine fracture. Moderate degenerative change. Electronically Signed   By: Franchot Gallo M.D.   On: 01/13/2018 15:11   Ct Cervical Spine Wo Contrast  Result Date: 01/13/2018 CLINICAL DATA:  Fall EXAM: CT HEAD WITHOUT CONTRAST CT CERVICAL SPINE WITHOUT CONTRAST TECHNIQUE: Multidetector CT imaging of the head and cervical spine was performed following the standard protocol without intravenous contrast. Multiplanar CT image reconstructions of the cervical spine were also generated. COMPARISON:  CT head 02/17/2015 FINDINGS: CT HEAD FINDINGS Brain: Moderate atrophy with ventricular enlargement. Progression of atrophy. Chronic microvascular ischemic changes in the white matter. Negative for acute infarct, hemorrhage, or mass. Vascular: Negative for hyperdense vessel Skull: Negative for skull fracture Sinuses/Orbits: Mucosal edema and bony thickening of the right maxillary sinus. Mucosal edema right frontal and ethmoid sinus. Bilateral cataract surgery.  Other: None CT CERVICAL SPINE FINDINGS Alignment: Mild anterolisthesis C4-5 and C5-6 Skull base and vertebrae: Negative for fracture. Hemangioma C7 and T1 vertebral bodies. Soft tissues and spinal canal: Multinodular goiter. Diffuse atherosclerotic calcification Disc levels: Diffuse disc and facet degeneration throughout the cervical spine. Mild foraminal narrowing bilaterally at multiple levels. Spinal canal adequate in size. Upper chest: No acute abnormality. Other: None IMPRESSION: 1. Atrophy and chronic microvascular ischemia. No acute intracranial abnormality 2. Negative for cervical spine fracture. Moderate degenerative change. Electronically Signed   By: Franchot Gallo M.D.   On: 01/13/2018 15:11   Dg Chest Portable 1 View  Result Date: 01/13/2018 CLINICAL DATA:  [place] today. History of atrial fibrillation. No current chest complaints. EXAM: PORTABLE CHEST 1 VIEW COMPARISON:  Portable chest x-ray dated February 17, 2015 FINDINGS: The lungs are mildly hyperinflated. The interstitial markings are coarse. There is no alveolar infiltrate or pleural effusion. There is no pneumothorax. The cardiac silhouette is chronically enlarged. The pulmonary vascularity is normal. There is calcification in the wall of the aortic arch. The bony thorax exhibits no acute abnormality. IMPRESSION: Chronic bronchitic changes. Stable cardiomegaly. There is no acute cardiopulmonary abnormality. Thoracic aortic atherosclerosis. Electronically Signed  By: David  Martinique M.D.   On: 01/13/2018 13:30      TODAY-DAY OF DISCHARGE:  Subjective:   Helane Rima today denies any chest pain or shortness of breath.  She is mostly alert but at times appears to be pleasantly confused.  Objective:   Blood pressure (!) 155/78, pulse 89, temperature (!) 97.5 F (36.4 C), temperature source Oral, resp. rate 18, height 5\' 2"  (1.575 m), weight 49.9 kg, SpO2 97 %.  Intake/Output Summary (Last 24 hours) at 01/15/2018 0949 Last data filed  at 01/15/2018 0600 Gross per 24 hour  Intake 665.5 ml  Output 300 ml  Net 365.5 ml   Filed Weights   01/13/18 1705  Weight: 49.9 kg    Exam: Awake Alert, pleasantly confused, No new F.N deficits, Normal affect [place].AT,PERRAL Supple Neck,No JVD, No cervical lymphadenopathy appriciated.  Symmetrical Chest wall movement, Good air movement bilaterally, CTAB RRR,No Gallops,Rubs or new Murmurs, No Parasternal Heave +ve B.Sounds, Abd Soft, Non tender, No organomegaly appriciated, No rebound -guarding or rigidity. No Cyanosis, Clubbing or edema, No new Rash or bruise   PERTINENT RADIOLOGIC STUDIES: Dg Elbow Complete Right  Result Date: 01/13/2018 CLINICAL DATA:  Laceration on the olecranon, unknown trauma EXAM: RIGHT ELBOW - COMPLETE 3+ VIEW COMPARISON:  None. FINDINGS: Alignment is normal. The elbow joint appears normal. No fracture is seen. No joint effusion is noted. Mild soft tissue prominence of the olecranon is noted with calcification which could indicate chronic calcific bursitis. IMPRESSION: 1. No fracture or joint effusion. 2. Possible chronic calcific olecranon bursitis. Correlate clinically. Electronically Signed   By: Ivar Drape M.D.   On: 01/13/2018 16:09   Ct Head Wo Contrast  Result Date: 01/13/2018 CLINICAL DATA:  Fall EXAM: CT HEAD WITHOUT CONTRAST CT CERVICAL SPINE WITHOUT CONTRAST TECHNIQUE: Multidetector CT imaging of the head and cervical spine was performed following the standard protocol without intravenous contrast. Multiplanar CT image reconstructions of the cervical spine were also generated. COMPARISON:  CT head 02/17/2015 FINDINGS: CT HEAD FINDINGS Brain: Moderate atrophy with ventricular enlargement. Progression of atrophy. Chronic microvascular ischemic changes in the white matter. Negative for acute infarct, hemorrhage, or mass. Vascular: Negative for hyperdense vessel Skull: Negative for skull fracture Sinuses/Orbits: Mucosal edema and bony thickening of the right  maxillary sinus. Mucosal edema right frontal and ethmoid sinus. Bilateral cataract surgery. Other: None CT CERVICAL SPINE FINDINGS Alignment: Mild anterolisthesis C4-5 and C5-6 Skull base and vertebrae: Negative for fracture. Hemangioma C7 and T1 vertebral bodies. Soft tissues and spinal canal: Multinodular goiter. Diffuse atherosclerotic calcification Disc levels: Diffuse disc and facet degeneration throughout the cervical spine. Mild foraminal narrowing bilaterally at multiple levels. Spinal canal adequate in size. Upper chest: No acute abnormality. Other: None IMPRESSION: 1. Atrophy and chronic microvascular ischemia. No acute intracranial abnormality 2. Negative for cervical spine fracture. Moderate degenerative change. Electronically Signed   By: Franchot Gallo M.D.   On: 01/13/2018 15:11   Ct Cervical Spine Wo Contrast  Result Date: 01/13/2018 CLINICAL DATA:  Fall EXAM: CT HEAD WITHOUT CONTRAST CT CERVICAL SPINE WITHOUT CONTRAST TECHNIQUE: Multidetector CT imaging of the head and cervical spine was performed following the standard protocol without intravenous contrast. Multiplanar CT image reconstructions of the cervical spine were also generated. COMPARISON:  CT head 02/17/2015 FINDINGS: CT HEAD FINDINGS Brain: Moderate atrophy with ventricular enlargement. Progression of atrophy. Chronic microvascular ischemic changes in the white matter. Negative for acute infarct, hemorrhage, or mass. Vascular: Negative for hyperdense vessel Skull: Negative for skull fracture Sinuses/Orbits:  Mucosal edema and bony thickening of the right maxillary sinus. Mucosal edema right frontal and ethmoid sinus. Bilateral cataract surgery. Other: None CT CERVICAL SPINE FINDINGS Alignment: Mild anterolisthesis C4-5 and C5-6 Skull base and vertebrae: Negative for fracture. Hemangioma C7 and T1 vertebral bodies. Soft tissues and spinal canal: Multinodular goiter. Diffuse atherosclerotic calcification Disc levels: Diffuse disc and  facet degeneration throughout the cervical spine. Mild foraminal narrowing bilaterally at multiple levels. Spinal canal adequate in size. Upper chest: No acute abnormality. Other: None IMPRESSION: 1. Atrophy and chronic microvascular ischemia. No acute intracranial abnormality 2. Negative for cervical spine fracture. Moderate degenerative change. Electronically Signed   By: Franchot Gallo M.D.   On: 01/13/2018 15:11   Dg Chest Portable 1 View  Result Date: 01/13/2018 CLINICAL DATA:  [place] today. History of atrial fibrillation. No current chest complaints. EXAM: PORTABLE CHEST 1 VIEW COMPARISON:  Portable chest x-ray dated February 17, 2015 FINDINGS: The lungs are mildly hyperinflated. The interstitial markings are coarse. There is no alveolar infiltrate or pleural effusion. There is no pneumothorax. The cardiac silhouette is chronically enlarged. The pulmonary vascularity is normal. There is calcification in the wall of the aortic arch. The bony thorax exhibits no acute abnormality. IMPRESSION: Chronic bronchitic changes. Stable cardiomegaly. There is no acute cardiopulmonary abnormality. Thoracic aortic atherosclerosis. Electronically Signed   By: David  Martinique M.D.   On: 01/13/2018 13:30     PERTINENT LAB RESULTS: CBC: Recent Labs    01/13/18 1246 01/15/18 0355  WBC 12.0* 7.8  HGB 13.6 11.4*  HCT 39.2 34.6*  PLT 229 191   CMET CMP     Component Value Date/Time   NA 141 01/14/2018 0301   NA 145 (H) 01/08/2018 1144   K 3.7 01/14/2018 0301   CL 109 01/14/2018 0301   CL 100 01/26/2016   CO2 24 01/14/2018 0301   CO2 25 01/26/2016   GLUCOSE 117 (H) 01/14/2018 0301   BUN 16 01/14/2018 0301   BUN 20 01/08/2018 1144   CREATININE 0.97 01/14/2018 0301   CREATININE 0.88 03/21/2016 0850   CALCIUM 9.6 01/14/2018 0301   CALCIUM 10.1 01/26/2016   PROT 7.6 01/13/2018 1246   PROT 7.3 01/08/2018 1144   ALBUMIN 4.4 01/13/2018 1246   ALBUMIN 5.0 (H) 01/08/2018 1144   ALBUMIN 4.4 01/26/2016   AST  55 (H) 01/13/2018 1246   ALT 34 01/13/2018 1246   ALKPHOS 88 01/13/2018 1246   BILITOT 1.1 01/13/2018 1246   BILITOT 0.6 01/08/2018 1144   GFRNONAA 48 (L) 01/14/2018 0301   GFRNONAA 57 (L) 03/21/2016 0850   GFRAA 56 (L) 01/14/2018 0301   GFRAA 66 03/21/2016 0850    GFR Estimated Creatinine Clearance: 27.9 mL/min (by C-G formula based on SCr of 0.97 mg/dL). No results for input(s): LIPASE, AMYLASE in the last 72 hours. Recent Labs    01/13/18 1246 01/14/18 0301  CKTOTAL 1,074* 703*   Invalid input(s): POCBNP No results for input(s): DDIMER in the last 72 hours. No results for input(s): HGBA1C in the last 72 hours. No results for input(s): CHOL, HDL, LDLCALC, TRIG, CHOLHDL, LDLDIRECT in the last 72 hours. No results for input(s): TSH, T4TOTAL, T3FREE, THYROIDAB in the last 72 hours.  Invalid input(s): FREET3 No results for input(s): VITAMINB12, FOLATE, FERRITIN, TIBC, IRON, RETICCTPCT in the last 72 hours. Coags: No results for input(s): INR in the last 72 hours.  Invalid input(s): PT Microbiology: Recent Results (from the past 240 hour(s))  Urine culture     Status: Abnormal (  Preliminary result)   Collection Time: 01/13/18  5:04 PM  Result Value Ref Range Status   Specimen Description URINE, RANDOM  Final   Special Requests NONE  Final   Culture (A)  Final    20,000 COLONIES/mL ESCHERICHIA COLI CULTURE REINCUBATED FOR BETTER GROWTH Performed at [facility], [address], [place], [place] [address]    Report Status PENDING  Incomplete   Organism ID, Bacteria ESCHERICHIA COLI (A)  Final      Susceptibility   Escherichia coli - MIC*    AMPICILLIN <=2 SENSITIVE Sensitive     CEFAZOLIN <=4 SENSITIVE Sensitive     CEFTRIAXONE <=1 SENSITIVE Sensitive     CIPROFLOXACIN <=0.25 SENSITIVE Sensitive     GENTAMICIN <=1 SENSITIVE Sensitive     IMIPENEM <=0.25 SENSITIVE Sensitive     NITROFURANTOIN <=16 SENSITIVE Sensitive     TRIMETH/SULFA <=20 SENSITIVE Sensitive      AMPICILLIN/SULBACTAM <=2 SENSITIVE Sensitive     PIP/TAZO <=4 SENSITIVE Sensitive     Extended ESBL NEGATIVE Sensitive     * 20,000 COLONIES/mL ESCHERICHIA COLI    FURTHER DISCHARGE INSTRUCTIONS:  Get Medicines reviewed and adjusted: Please take all your medications with you for your next visit with your Primary MD  Laboratory/radiological data: Please request your Primary MD to go over all hospital tests and procedure/radiological results at the follow up, please ask your Primary MD to get all Hospital records sent to his/her office.  In some cases, they will be blood work, cultures and biopsy results pending at the time of your discharge. Please request that your primary care M.D. goes through all the records of your hospital data and follows up on these results.  Also Note the following: If you experience worsening of your admission symptoms, develop shortness of breath, life threatening emergency, suicidal or homicidal thoughts you must seek medical attention immediately by calling 911 or calling your MD immediately  if symptoms less severe.  You must read complete instructions/literature along with all the possible adverse reactions/side effects for all the Medicines you take and that have been prescribed to you. Take any new Medicines after you have completely understood and accpet all the possible adverse reactions/side effects.   Do not drive when taking Pain medications or sleeping medications (Benzodaizepines)  Do not take more than prescribed Pain, Sleep and Anxiety Medications. It is not advisable to combine anxiety,sleep and pain medications without talking with your primary care practitioner  Special Instructions: If you have smoked or chewed Tobacco  in the last 2 yrs please stop smoking, stop any regular Alcohol  and or any Recreational drug use.  Wear Seat belts while driving.  Please note: You were cared for by a hospitalist during your hospital stay. Once you are  discharged, your primary care physician will handle any further medical issues. Please note that NO REFILLS for any discharge medications will be authorized once you are discharged, as it is imperative that you return to your primary care physician (or establish a relationship with a primary care physician if you do not have one) for your post hospital discharge needs so that they can reassess your need for medications and monitor your lab values.  Total Time spent coordinating discharge including counseling, education and face to face time equals35 minutes.  SignedOren Binet 01/15/2018 9:49 AM

## 2018-01-15 NOTE — Care Management Obs Status (Signed)
[facility] NOTIFICATION   Patient Details  Name: ZAKEYA JUNKER MRN: [id number] Date of Birth: Sep 12, 1921   Medicare Observation Status Notification Given:  Yes    Sharin Mons, RN 01/15/2018, 1:10 PM

## 2018-01-15 NOTE — Clinical Social Work Placement (Signed)
   CLINICAL SOCIAL WORK PLACEMENT  NOTE  Date:  01/15/2018  Patient Details  Name: Brianna Thompson MRN: [id number] Date of Birth: 1922/01/02  Clinical Social Work is seeking post-discharge placement for this patient at the [place] level of care (*CSW will initial, date and re-position this form in  chart as items are completed):  Yes   Patient/family provided with [facility] Work Department's list of facilities offering this level of care within the geographic area requested by the patient (or if unable, by the patient's family).  Yes   Patient/family informed of their freedom to choose among providers that offer the needed level of care, that participate in Medicare, Medicaid or managed care program needed by the patient, have an available bed and are willing to accept the patient.  Yes   Patient/family informed of [facility]'s ownership interest in [facility] and [facility], as well as of the fact that they are under no obligation to receive care at these facilities.  PASRR submitted to EDS on       PASRR number received on       Existing PASRR number confirmed on 01/14/18     FL2 transmitted to all facilities in geographic area requested by pt/family on 01/14/18     FL2 transmitted to all facilities within larger geographic area on       Patient informed that his/her managed care company has contracts with or will negotiate with certain facilities, including the following:        Yes   Patient/family informed of bed offers received.  Patient chooses bed at [facility], [place]     Physician recommends and patient chooses bed at      Patient to be transferred to [facility] on 01/15/18.  Patient to be transferred to facility by [facility]     Patient family notified on 01/15/18 of transfer.  Name of family member notified:  [name], [place]     PHYSICIAN       Additional Comment:     _______________________________________________ Benard Halsted, LCSW 01/15/2018, 11:39 AM

## 2018-01-16 LAB — URINE CULTURE: Culture: 20000 — AB

## 2018-01-18 DIAGNOSIS — R410 Disorientation, unspecified: Secondary | ICD-10-CM | POA: Diagnosis not present

## 2018-01-18 DIAGNOSIS — G309 Alzheimer's disease, unspecified: Secondary | ICD-10-CM | POA: Diagnosis not present

## 2018-01-18 DIAGNOSIS — M6282 Rhabdomyolysis: Secondary | ICD-10-CM | POA: Diagnosis not present

## 2018-01-18 DIAGNOSIS — R2689 Other abnormalities of gait and mobility: Secondary | ICD-10-CM | POA: Diagnosis not present

## 2018-01-21 ENCOUNTER — Encounter (INDEPENDENT_AMBULATORY_CARE_PROVIDER_SITE_OTHER): Payer: Medicare Other | Admitting: Ophthalmology

## 2018-01-26 DIAGNOSIS — R05 Cough: Secondary | ICD-10-CM | POA: Diagnosis not present

## 2018-01-28 NOTE — Addendum Note (Signed)
Addended by: Marjory Sneddon on: 01/28/2018 01:20 PM   Modules accepted: Level of Service

## 2018-01-30 ENCOUNTER — Telehealth: Payer: Self-pay | Admitting: Family Medicine

## 2018-01-30 NOTE — Telephone Encounter (Signed)
Patient is getting D/C'd from [facility] and is getting set up with [facility] for PT/OT. PHC is calling to get some verbal orders for patient and can be reached at [phone]

## 2018-02-02 NOTE — Telephone Encounter (Signed)
Called and verified that Dr. Raliegh Scarlet is patient's PCP and will be doing home health orders. MPulliam, CMA/RT(R)

## 2018-02-03 ENCOUNTER — Ambulatory Visit: Payer: Medicare Other | Admitting: Family Medicine

## 2018-02-03 DIAGNOSIS — I4891 Unspecified atrial fibrillation: Secondary | ICD-10-CM | POA: Diagnosis not present

## 2018-02-03 DIAGNOSIS — Z9181 History of falling: Secondary | ICD-10-CM | POA: Diagnosis not present

## 2018-02-03 DIAGNOSIS — E039 Hypothyroidism, unspecified: Secondary | ICD-10-CM | POA: Diagnosis not present

## 2018-02-03 DIAGNOSIS — F0280 Dementia in other diseases classified elsewhere without behavioral disturbance: Secondary | ICD-10-CM | POA: Diagnosis not present

## 2018-02-03 DIAGNOSIS — H409 Unspecified glaucoma: Secondary | ICD-10-CM | POA: Diagnosis not present

## 2018-02-03 DIAGNOSIS — H3530 Unspecified macular degeneration: Secondary | ICD-10-CM | POA: Diagnosis not present

## 2018-02-03 DIAGNOSIS — M6282 Rhabdomyolysis: Secondary | ICD-10-CM | POA: Diagnosis not present

## 2018-02-03 DIAGNOSIS — G309 Alzheimer's disease, unspecified: Secondary | ICD-10-CM | POA: Diagnosis not present

## 2018-02-03 DIAGNOSIS — R2681 Unsteadiness on feet: Secondary | ICD-10-CM | POA: Diagnosis not present

## 2018-02-03 DIAGNOSIS — E440 Moderate protein-calorie malnutrition: Secondary | ICD-10-CM | POA: Diagnosis not present

## 2018-02-04 ENCOUNTER — Encounter: Payer: Self-pay | Admitting: Family Medicine

## 2018-02-04 ENCOUNTER — Ambulatory Visit: Payer: Medicare Other | Admitting: Family Medicine

## 2018-02-04 DIAGNOSIS — I10 Essential (primary) hypertension: Secondary | ICD-10-CM

## 2018-02-04 DIAGNOSIS — R748 Abnormal levels of other serum enzymes: Secondary | ICD-10-CM

## 2018-02-04 DIAGNOSIS — E440 Moderate protein-calorie malnutrition: Secondary | ICD-10-CM

## 2018-02-04 NOTE — Progress Notes (Signed)
Impression and Recommendations:    1. Essential hypertension   2. Elevated CPK   3. Malnutrition of moderate degree      Hospital Follow up -Ordered blood work today -Discussed home PT to help with mobility; daughter states home PT is coming twice per week and occupational therapy will begin soon -Explained to pt that elderly pts often see a decrease in mobility when they sleep all day and lay in bed -Explained that pt's blood work in the hospital improved drastically with IV fluids -Recommended pt to increase their water intake to half of their body weight in ounces. -Instructed daughter to ensure pt is drinking enough fluids and moving regularly -Encouraged pt's daughter to contact us if she notices any negative changes   Gross side effects, risk and benefits, and alternatives of medications and treatment plan in general discussed with patient.  Patient is aware that all medications have potential side effects and we are unable to predict every side effect or drug-drug interaction that may occur.   Patient will call with any questions prior to using medication if they have concerns.    Expresses verbal understanding and consents to current therapy and treatment regimen.  No barriers to understanding were identified.  Red flag symptoms and signs discussed in detail.  Patient expressed understanding regarding what to do in case of emergency\urgent symptoms  Please see AVS handed out to patient at the end of our visit for further patient instructions/ counseling done pertaining to today's office visit.   Return for 84mo and sooner as needed.     Note:  This note was prepared with assistance of Dragon voice recognition software. Occasional wrong-word or sound-a-like substitutions may have occurred due to the inherent limitations of voice recognition software.  This document serves as a record of services personally performed by Mellody Dance, MD. It was created on her behalf by  Georga Bora, a trained medical scribe. The creation of this record is based on the scribe's personal observations and the provider's statements to them.   I have reviewed the above medical documentation for accuracy and completeness and I concur.  Mellody Dance, D.O.       --------------------------------------------------------------------------------------------------------------------------------------------------------------------------------------------------------------------------------------------    Subjective:     HPI: Brianna Thompson is a 84 y.o. female who presents to [place] [facility] today for issues as discussed below.   Hospital Follow up -Pt states she is eating and drinking ok  -Pt states she is not having issues passing stool and urine -Daughter also states pt is eating and drinking well  -Daughter states pt is living at her daughter's house now -daughter won't let pt stay in bed all night because "it's not good for her to sleep all day" -Pt states she doesn't like when her daughter is bossing her around, but she likes staying at her house other than that -Pt says she would rather stay at her daughter's house than in a nursing home  -Daughter says she got a bad cough while in the nursing home but she is over it now -Daughter states pt sometimes wants to sleep through meals but she doesn't let her   Fall -Daughter states pt improved over the next few days after her last appointment -States she had been walking fine -Daughter believes she slid out of bed when she woke up one day -Pt did not have any major injuries aside from a skin abrasion from falling on the floor  -Daughter  has not allowed pt to return home to live alone since incident   Wt Readings from Last 3 Encounters:  01/13/18 110 lb (49.9 kg)  01/08/18 110 lb (49.9 kg)  10/06/17 115 lb (52.2 kg)   BP Readings from Last 3 Encounters:  01/15/18 (!) 169/96  01/08/18  (!) 158/94  10/06/17 122/66   Pulse Readings from Last 3 Encounters:  01/15/18 91  01/08/18 96  10/06/17 (!) 59   BMI Readings from Last 3 Encounters:  01/13/18 20.12 kg/m  01/08/18 20.12 kg/m  10/06/17 21.03 kg/m     Patient Care Team    Relationship Specialty Notifications Start End  Mellody Dance, DO PCP - General Family Medicine  01/31/16   Leandrew Koyanagi, MD Attending Physician Orthopedic Surgery  01/31/16   Hayden Pedro, MD Consulting Physician Ophthalmology  01/31/16   Calvert Cantor, MD Consulting Physician Optometry  03/18/16   Sydnee Levans, MD Referring Physician Dermatology  05/13/17      Patient Active Problem List   Diagnosis Date Noted  . Blood glucose elevated (& elevated A1c) 04/02/2016    Priority: High  . HLD (hyperlipidemia) 03/18/2016    Priority: High  . HTN (hypertension) 02/17/2015    Priority: High  . Basal cell carcinoma-  R proximal central dorsal wrist 12/01/2016    Priority: Medium  . Chronic anticoagulation 03/18/2016    Priority: Medium  . h/o Atrial fibrillation ([place]), CHA2DS2-VASc Score 5-6 02/17/2015    Priority: Medium  . h/o Diastolic dysfunction with chronic heart failure ([place]) 02/17/2015    Priority: Medium  . Macular degeneration 03/18/2016    Priority: Low  . Hypothyroidism 02/17/2015    Priority: Low  . Glaucoma 02/17/2015    Priority: Low  . Malnutrition of moderate degree 01/14/2018  . Iron deficiency 10/06/2017  . Vitamin D insufficiency 10/06/2017  . History of osteopenia 03/10/2017  . 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 Solar keratosis 10/07/2016  . History of skin cancer 08/15/2016  . Wound of left leg 08/15/2016  . h/o Irregular heart rhythm 03/18/2016  . Encounter for screening for diseases of the blood and blood-forming organs and certain disorders involving the immune mechanism 03/18/2016  . Counseling on health promotion and disease prevention 03/18/2016  .  Hip fracture ([place]) 02/17/2015  . Closed left hip fracture ([place]) 02/17/2015  . Fall 02/17/2015  . H/O hypokalemia 02/17/2015  . Traumatic hematoma of left hand 02/17/2015  . Elevated CPK 02/17/2015    Past Medical history, Surgical history, Family history, Social history, Allergies and Medications have been entered into the medical record, reviewed and changed as needed.    Current Meds  Medication Sig  . acetaminophen (TYLENOL) 325 MG tablet Take 2 tablets (650 mg total) by mouth every 6 (six) hours as needed for mild pain, moderate pain, fever or headache.  Marland Kitchen amoxicillin-clavulanate (AUGMENTIN) 875-125 MG tablet Take 1 tablet by mouth 2 (two) times daily.  Marland Kitchen Besifloxacin HCl (BESIVANCE) 0.6 % SUSP Place 1 drop into the left eye every 30 (thirty) days.   . brimonidine (ALPHAGAN) 0.2 % ophthalmic solution Place 1 drop into both eyes 3 (three) times daily.  . Cholecalciferol (VITAMIN D3 SUPER STRENGTH) 2000 units CAPS Take 2,000 Units by mouth daily.   Marland Kitchen docusate sodium (COLACE) 100 MG capsule Take 1 capsule (100 mg total) by mouth 2 (two) times daily.  . dorzolamide (TRUSOPT) 2 % ophthalmic solution Place 1 drop into both eyes  daily.  . feeding supplement, ENSURE ENLIVE, (ENSURE ENLIVE) LIQD Take 237 mLs by mouth 2 (two) times daily between meals.  . furosemide (LASIX) 20 MG tablet TAKE 1 TABLET (20 MG TOTAL) BY MOUTH DAILY.  Marland Kitchen levothyroxine (SYNTHROID, LEVOTHROID) 50 MCG tablet TAKE 1 TABLET BY MOUTH DAILY BEFORE BREAKFAST. (Patient taking differently: Take 50 mcg by mouth daily before breakfast. )  . metoprolol tartrate (LOPRESSOR) 100 MG tablet Take 1 tablet (100 mg total) by mouth daily.  . Multiple Vitamins-Minerals (PRESERVISION AREDS 2) CAPS Take 1 capsule by mouth 2 (two) times daily.  . polyethylene glycol (MIRALAX / GLYCOLAX) packet Take 17 g by mouth daily as needed for mild constipation.  . potassium chloride (K-DUR) 10 MEQ tablet Take 1 tablet (10 mEq total) by mouth daily.  .  Probiotic Product (PROBIOTIC DAILY PO) Take 1 tablet by mouth daily.  . Rivaroxaban (XARELTO) 15 MG TABS tablet TAKE 1 TABLET BY MOUTH DAILY WITH SUPPER (Patient taking differently: Take 15 mg by mouth daily with supper. )  . timolol (BETIMOL) 0.5 % ophthalmic solution Place 1 drop into both eyes 2 (two) times daily.  . traMADol (ULTRAM) 50 MG tablet Take 0.5 tablets (25 mg total) by mouth every 6 (six) hours as needed for moderate pain.    Allergies:  Allergies  Allergen Reactions  . Calcium-Containing Compounds Other (See Comments)    constipation     Review of Systems:  A fourteen system review of systems was performed and found to be positive as per HPI.   Objective:   There were no vitals taken for this visit. There is no height or weight on file to calculate BMI. General:  Well Developed, well nourished, appropriate for stated age.  Neuro:  Alert and oriented,  extra-ocular muscles intact  HEENT:  Normocephalic, atraumatic, neck supple, no carotid bruits appreciated  Skin:  no gross rash, warm, pink. Cardiac:  RRR, S1 S2 Respiratory:  ECTA B/L and A/P, Not using accessory muscles, speaking in full sentences- unlabored. Vascular:  Ext warm, no cyanosis apprec.; cap RF less 2 sec. Psych:  No HI/SI, judgement and insight good, Euthymic mood. Full Affect.

## 2018-02-04 NOTE — Patient Instructions (Signed)
  -  The AHA strongly endorses consumption of a diet that contains a variety of foods from all the food categories with an emphasis on fruits and vegetables; fat-free and low-fat dairy products; cereal and grain products; legumes and nuts; and fish, poultry, and/or extra lean meats.    Excessive food intake, especially of foods high in saturated and trans fats, sugar, and salt, should be avoided.    Adequate water intake of roughly 1/2 of your weight in pounds, should equal the ounces of water per day you should drink.  So for instance, if you're 200 pounds, that would be 100 ounces of water per day.         Mediterranean Diet  Why follow it? Research shows. . Those who follow the Mediterranean diet have a reduced risk of heart disease  . The diet is associated with a reduced incidence of Parkinson's and Alzheimer's diseases . People following the diet may have longer life expectancies and lower rates of chronic diseases  . The Dietary Guidelines for Americans recommends the Mediterranean diet as an eating plan to promote health and prevent disease  What Is the Mediterranean Diet?  . Healthy eating plan based on typical foods and recipes of Mediterranean-style cooking . The diet is primarily a plant based diet; these foods should make up a majority of meals   Starches - Plant based foods should make up a majority of meals - They are an important sources of vitamins, minerals, energy, antioxidants, and fiber - Choose whole grains, foods high in fiber and minimally processed items  - Typical grain sources include wheat, oats, barley, corn, brown rice, bulgar, farro, millet, polenta, couscous  - Various types of beans include chickpeas, lentils, fava beans, black beans, white beans   Fruits  Veggies - Large quantities of antioxidant rich fruits & veggies; 6 or more servings  - Vegetables can be eaten raw or lightly drizzled with oil and cooked  - Vegetables common to the traditional  Mediterranean Diet include: artichokes, arugula, beets, broccoli, brussel sprouts, cabbage, carrots, celery, collard greens, cucumbers, eggplant, kale, leeks, lemons, lettuce, mushrooms, okra, onions, peas, peppers, potatoes, pumpkin, radishes, rutabaga, shallots, spinach, sweet potatoes, turnips, zucchini - Fruits common to the Mediterranean Diet include: apples, apricots, avocados, cherries, clementines, dates, figs, grapefruits, grapes, melons, nectarines, oranges, peaches, pears, pomegranates, strawberries, tangerines  Fats - Replace butter and margarine with healthy oils, such as olive oil, canola oil, and tahini  - Limit nuts to no more than a handful a day  - Nuts include walnuts, almonds, pecans, pistachios, pine nuts  - Limit or avoid candied, honey roasted or heavily salted nuts - Olives are central to the Mediterranean diet - can be eaten whole or used in a variety of dishes   Meats Protein - Limiting red meat: no more than a few times a month - When eating red meat: choose lean cuts and keep the portion to the size of deck of cards - Eggs: approx. 0 to 4 times a week  - Fish and lean poultry: at least 2 a week  - Healthy protein sources include, chicken, turkey, lean beef, lamb - Increase intake of seafood such as tuna, salmon, trout, mackerel, shrimp, scallops - Avoid or limit high fat processed meats such as sausage and bacon  Dairy - Include moderate amounts of low fat dairy products  - Focus on healthy dairy such as fat free yogurt, skim milk, low or reduced fat cheese - Limit dairy products   higher in fat such as whole or 2% milk, cheese, ice cream  Alcohol - Moderate amounts of red wine is ok  - No more than 5 oz daily for women (all ages) and men older than age 65  - No more than 10 oz of wine daily for men younger than 65  Other - Limit sweets and other desserts  - Use herbs and spices instead of salt to flavor foods  - Herbs and spices common to the traditional Mediterranean  Diet include: basil, bay leaves, chives, cloves, cumin, fennel, garlic, lavender, marjoram, mint, oregano, parsley, pepper, rosemary, sage, savory, sumac, tarragon, thyme   It's not just a diet, it's a lifestyle:  . The Mediterranean diet includes lifestyle factors typical of those in the region  . Foods, drinks and meals are best eaten with others and savored . Daily physical activity is important for overall good health . This could be strenuous exercise like running and aerobics . This could also be more leisurely activities such as walking, housework, yard-work, or taking the stairs . Moderation is the key; a balanced and healthy diet accommodates most foods and drinks . Consider portion sizes and frequency of consumption of certain foods   Meal Ideas & Options:  . Breakfast:  o Whole wheat toast or whole wheat English muffins with peanut butter & hard boiled egg o Steel cut oats topped with apples & cinnamon and skim milk  o Fresh fruit: banana, strawberries, melon, berries, peaches  o Smoothies: strawberries, bananas, greek yogurt, peanut butter o Low fat greek yogurt with blueberries and granola  o Egg white omelet with spinach and mushrooms o Breakfast couscous: whole wheat couscous, apricots, skim milk, cranberries  . Sandwiches:  o Hummus and grilled vegetables (peppers, zucchini, squash) on whole wheat bread   o Grilled chicken on whole wheat pita with lettuce, tomatoes, cucumbers or tzatziki  o Tuna salad on whole wheat bread: tuna salad made with greek yogurt, olives, red peppers, capers, green onions o Garlic rosemary lamb pita: lamb sauted with garlic, rosemary, salt & pepper; add lettuce, cucumber, greek yogurt to pita - flavor with lemon juice and black pepper  . Seafood:  o Mediterranean grilled salmon, seasoned with garlic, basil, parsley, lemon juice and black pepper o Shrimp, lemon, and spinach whole-grain pasta salad made with low fat greek yogurt  o Seared scallops  with lemon orzo  o Seared tuna steaks seasoned salt, pepper, coriander topped with tomato mixture of olives, tomatoes, olive oil, minced garlic, parsley, green onions and cappers  . Meats:  o Herbed greek chicken salad with kalamata olives, cucumber, feta  o Red bell peppers stuffed with spinach, bulgur, lean ground beef (or lentils) & topped with feta   o Kebabs: skewers of chicken, tomatoes, onions, zucchini, squash  o Turkey burgers: made with red onions, mint, dill, lemon juice, feta cheese topped with roasted red peppers . Vegetarian o Cucumber salad: cucumbers, artichoke hearts, celery, red onion, feta cheese, tossed in olive oil & lemon juice  o Hummus and whole grain pita points with a greek salad (lettuce, tomato, feta, olives, cucumbers, red onion) o Lentil soup with celery, carrots made with vegetable broth, garlic, salt and pepper  o Tabouli salad: parsley, bulgur, mint, scallions, cucumbers, tomato, radishes, lemon juice, olive oil, salt and pepper.     

## 2018-02-05 LAB — COMPREHENSIVE METABOLIC PANEL
A/G RATIO: 1.3 (ref 1.2–2.2)
ALBUMIN: 3.9 g/dL (ref 3.2–4.6)
ALT: 29 IU/L (ref 0–32)
AST: 37 IU/L (ref 0–40)
Alkaline Phosphatase: 92 IU/L (ref 39–117)
BUN/Creatinine Ratio: 29 — ABNORMAL HIGH (ref 12–28)
BUN: 27 mg/dL (ref 10–36)
Bilirubin Total: 0.2 mg/dL (ref 0.0–1.2)
CALCIUM: 10.4 mg/dL — AB (ref 8.7–10.3)
CO2: 24 mmol/L (ref 20–29)
CREATININE: 0.94 mg/dL (ref 0.57–1.00)
Chloride: 101 mmol/L (ref 96–106)
GFR, EST AFRICAN AMERICAN: 60 mL/min/{1.73_m2} (ref >59)
GFR, EST NON AFRICAN AMERICAN: 52 mL/min/{1.73_m2} — AB (ref >59)
Globulin, Total: 3 g/dL (ref 1.5–4.5)
Glucose: 103 mg/dL — ABNORMAL HIGH (ref 65–99)
Potassium: 5.2 mmol/L (ref 3.5–5.2)
SODIUM: 142 mmol/L (ref 134–144)
TOTAL PROTEIN: 6.9 g/dL (ref 6.0–8.5)

## 2018-02-05 LAB — CBC WITH DIFFERENTIAL/PLATELET
BASOS: 1 %
Basophils Absolute: 0.1 10*3/uL (ref 0.0–0.2)
EOS (ABSOLUTE): 0.1 10*3/uL (ref 0.0–0.4)
Eos: 2 %
HEMATOCRIT: 38.6 % (ref 34.0–46.6)
HEMOGLOBIN: 12.7 g/dL (ref 11.1–15.9)
Immature Grans (Abs): 0.1 10*3/uL (ref 0.0–0.1)
Immature Granulocytes: 1 %
Lymphocytes Absolute: 1.7 10*3/uL (ref 0.7–3.1)
Lymphs: 23 %
MCH: 33 pg (ref 26.6–33.0)
MCHC: 32.9 g/dL (ref 31.5–35.7)
MCV: 100 fL — AB (ref 79–97)
Monocytes Absolute: 0.7 10*3/uL (ref 0.1–0.9)
Monocytes: 10 %
NEUTROS PCT: 63 %
Neutrophils Absolute: 4.7 10*3/uL (ref 1.4–7.0)
Platelets: 394 10*3/uL (ref 150–450)
RBC: 3.85 x10E6/uL (ref 3.77–5.28)
RDW: 15.1 % (ref 12.3–15.4)
WBC: 7.4 10*3/uL (ref 3.4–10.8)

## 2018-02-05 LAB — CK: Total CK: 64 U/L (ref 24–173)

## 2018-03-04 ENCOUNTER — Encounter (INDEPENDENT_AMBULATORY_CARE_PROVIDER_SITE_OTHER): Payer: Medicare Other | Admitting: Ophthalmology

## 2018-03-04 DIAGNOSIS — H43813 Vitreous degeneration, bilateral: Secondary | ICD-10-CM

## 2018-03-04 DIAGNOSIS — H353211 Exudative age-related macular degeneration, right eye, with active choroidal neovascularization: Secondary | ICD-10-CM

## 2018-03-04 DIAGNOSIS — H35033 Hypertensive retinopathy, bilateral: Secondary | ICD-10-CM | POA: Diagnosis not present

## 2018-03-04 DIAGNOSIS — H353122 Nonexudative age-related macular degeneration, left eye, intermediate dry stage: Secondary | ICD-10-CM

## 2018-03-04 DIAGNOSIS — H34832 Tributary (branch) retinal vein occlusion, left eye, with macular edema: Secondary | ICD-10-CM

## 2018-03-04 DIAGNOSIS — I10 Essential (primary) hypertension: Secondary | ICD-10-CM

## 2018-03-13 ENCOUNTER — Other Ambulatory Visit: Payer: Self-pay | Admitting: Family Medicine

## 2018-03-18 ENCOUNTER — Ambulatory Visit: Payer: Medicare Other

## 2018-03-18 ENCOUNTER — Other Ambulatory Visit: Payer: Self-pay | Admitting: Family Medicine

## 2018-03-18 ENCOUNTER — Encounter: Payer: Self-pay | Admitting: Family Medicine

## 2018-03-18 ENCOUNTER — Ambulatory Visit: Payer: Medicare Other | Admitting: Family Medicine

## 2018-03-18 VITALS — BP 134/82 | HR 86 | Temp 98.0°F | Ht 62.0 in | Wt 105.0 lb

## 2018-03-18 DIAGNOSIS — R531 Weakness: Secondary | ICD-10-CM

## 2018-03-18 DIAGNOSIS — R05 Cough: Secondary | ICD-10-CM

## 2018-03-18 DIAGNOSIS — Z7409 Other reduced mobility: Secondary | ICD-10-CM | POA: Diagnosis not present

## 2018-03-18 DIAGNOSIS — R059 Cough, unspecified: Secondary | ICD-10-CM

## 2018-03-18 DIAGNOSIS — R058 Other specified cough: Secondary | ICD-10-CM

## 2018-03-18 NOTE — Progress Notes (Signed)
Acute Care Office visit  Assessment and plan:  1. Productive cough   2. Immobility   3. Weakness   4. Cough     1. Productive Cough, Weakness, Immobility - On 07/18/2015, patient presented with cough for two months, since February 2017.  Chest X-ray at that time was essentially normal, and it was recommended that if coughing persisted, CT chest with IV contrast would be indicated.  Patient never went for this follow-up.  - X-ray obtained today.  Directly visualized and independently interpreted x-rays which further influenced my plan of care. - Only able to obtain a PA film today.  PA film showed cardiomegaly with mild hilar congestion bilaterally.  Unable to see retrocardiac space and confirm effusions present or not / infilitrate present or not since lateral view was unable to be obtained due to patient age and limited physical abilities.  - Educated patient and caregivers extensively about X-ray results and findings.  Discussed that the films were limited due to patient's age and physical conditioning.  - Reviewed that if this cough continues, a CT scan for future evaluation is warranted, similar to indications in 2017.  - Take three huge deep breaths every commercial.  Educated patient that this helps to inflate the lungs and prevents them from further collapse.  - Causes for pt's symptoms reviewed, including potential viral and bacterial causes.  - Encouraged patient's caretakers to tell her therapist to begin chest physiotherapy for the chest wall, including respiratory cupping and percussion, to improve pulmonary toilet.  - Discussed that if patient cannot be taken care of safely at home, she may need to go into a nursing home.  - Reviewed that if patient lies in bed all day, she will weaken and deteriorate faster.  However, discussed that patient's caregivers cannot control her behavior.  - Educated patient and caregivers that if she does sleep all day, and does not get up  and try to be more active, take deeper breaths, and overall be more active, she will become weaker, more prone to infection, and deteriorate faster.  - Supportive care strongly encouraged, including adequate hydration and physical activity as tolerated.  - If patient has increased frequency of urination, increased urgency of urination, more urination than her usual, dysuria, or other symptoms of potential infection, discussed that we may see the patient acutely for evaluation.  - Call or RTC if she develops fever or chills, new symptoms, or if no improvement or worse over next few weeks.   Gross side effects, risk and benefits, and alternatives of medications discussed with patient.  Patient is aware that all medications have potential side effects and we are unable to predict every sideeffect or drug-drug interaction that may occur.  Expresses verbal understanding and consents to current therapy plan and treatment regiment.   Education and routine counseling performed. Handouts provided.  Anticipatory guidance and routine counseling done re: condition, txmnt options and need for follow up. All questions of patient's were answered.  Return if symptoms worsen or fail to improve, for Follow-up for chronic care as previously discussed.  Please see AVS handed out to patient at the end of our visit for additional patient instructions/ counseling done pertaining to today's office visit.  Note:  This document was partially repared using Dragon voice recognition software and may include unintentional dictation errors.  This document serves as a record of services personally performed by Mellody Dance, DO. It was created on her behalf by Toni Amend, a trained medical  scribe. The creation of this record is based on the scribe's personal observations and the provider's statements to them.   I have reviewed the above medical documentation for accuracy and completeness and I concur.  Mellody Dance, DO 03/23/2018 1:05 PM       Subjective:    Chief Complaint  Patient presents with  . Cough    Patient is here with two family members, including her primary caretaker, her daughter-in-law, and her grandson (who helped assist with transportation of patient to clinic today).  Daughter-in-law states "[patient] fell this morning.  She came out of her bedroom with her walker and fell" while grasping the doorknob.  Daughter-in-law wonders today of malodorous urine is a cause for evaluation.  HPI:  Pt presents with ongoing symptoms of rhonchorous cough.   C/o:  Patient states "I just feel bad." "I feel good once in a while, and then I don't."  Her daughter-in-law states "she's still got that horrible cough."  "It's better, but it's still deep in there.  [Patient] is not strong enough to get it out."  Patient's grandson notes "she can't take a deep enough breath."  Patient confirms that she feels short of breath, more than usual.  However, her SOB is no worse when she lays flat.  Denies: Denies fever, chills.  Denies increased swelling of legs.  Overall getting:  Better overall, per her caregivers, but coughing all day long.  Her coughing is not worse at night.   Patient Care Team    Relationship Specialty Notifications Start End  Mellody Dance, DO PCP - General Family Medicine  01/31/16   Leandrew Koyanagi, MD Attending Physician Orthopedic Surgery  01/31/16   Hayden Pedro, MD Consulting Physician Ophthalmology  01/31/16   Calvert Cantor, MD Consulting Physician Optometry  03/18/16   Sydnee Levans, MD Referring Physician Dermatology  05/13/17     Past medical history, Surgical history, Family history reviewed and noted below, Social history, Allergies, and Medications have been entered into the medical record, reviewed and changed as needed.   Allergies  Allergen Reactions  . Calcium-Containing Compounds Other (See Comments)    constipation    Review of  Systems: - see above HPI for pertinent positives General:   No F/C, wt loss Pulm:   No DIB, pleuritic chest pain Card:  No CP, palpitations Abd:  No n/v/d or pain Ext:  No inc edema from baseline   Objective:   Blood pressure 134/82, pulse 86, temperature 98 F (36.7 C), height 5\' 2"  (1.575 m), weight 105 lb (47.6 kg), SpO2 98 %. Body mass index is 19.2 kg/m. General: Well Developed, well nourished, appropriate for stated age.  Neuro: Alert and oriented x3, extra-ocular muscles intact, sensation grossly intact.  HEENT: Normocephalic, atraumatic, pupils equal round reactive to light, neck supple, no masses, no painful lymphadenopathy, TM's intact B/L, no acute findings. Nares- patent, clear d/c, OP- clear, mild erythema, No TTP sinuses Skin: Warm and dry, no gross rash. Cardiac: RRR, S1 S2,  no murmurs rubs or gallops.  Respiratory: Decreased breath sounds throughout, no rhonchi or wheeze.  ECTA B/L and A/P, Not using accessory muscles, speaking in full sentences- unlabored. Vascular:  No gross lower ext edema, cap RF less 2 sec. Psych: No HI/SI, judgement and insight good, Euthymic mood. Full Affect.

## 2018-03-19 ENCOUNTER — Ambulatory Visit: Payer: Medicare Other | Admitting: Family Medicine

## 2018-03-27 ENCOUNTER — Telehealth: Payer: Self-pay | Admitting: Family Medicine

## 2018-03-27 DIAGNOSIS — R05 Cough: Secondary | ICD-10-CM

## 2018-03-27 DIAGNOSIS — R059 Cough, unspecified: Secondary | ICD-10-CM

## 2018-03-27 NOTE — Telephone Encounter (Signed)
[name] (patient's daughter) is stating that Brianna Thompson still has a nagging cough and is coughing up thick dark green mucus. She is hoping that since she has been sick for a few weeks with this and has been assessed in our office that Dr. Jenetta Downer will prescribe an abx for her.  [name] herself is sick and she states that it seems they are just passing it around with the close contact she has with Brianna Thompson during meals and bathing on a daily basis, and that's why she is not getting any better.  She states it will be really hard to bring Brianna Thompson up to our office if an OV is needed, with her weakened state and [name] being sick too and no family support during the holidays.  If approved please send abx to [facility].

## 2018-03-30 MED ORDER — AMOXICILLIN-POT CLAVULANATE 875-125 MG PO TABS: 1.0000 | ORAL_TABLET | Freq: Two times a day (BID) | ORAL | 0 refills | Status: AC

## 2018-03-30 NOTE — Telephone Encounter (Signed)
As long as no C/I- augmentin 875 BID for 10d.

## 2018-03-30 NOTE — Telephone Encounter (Signed)
Spoke to patient's daughter in law [name]) and she states that the patient has taken Augmentin and tolerated it well.  Medication sent into the pharmacy per Dr. Hershal Coria note. MPulliam, CMA/RT(R)

## 2018-03-30 NOTE — Addendum Note (Signed)
Addended by: Lanier Prude D on: 03/30/2018 04:31 PM   Modules accepted: Orders

## 2018-03-30 NOTE — Telephone Encounter (Signed)
Spoke to patient's daughter in [employer]. Patient is still coughing, no improvement.  Patient was able to get some mucus up with the cough this weekend - it was thick and green.  LOV 03/18/2018.  [name] states that it is extremely hard to get patient into the office for an office visit.  Please review and advise. MPulliam, CMA/RT(R)

## 2018-04-01 ENCOUNTER — Encounter (INDEPENDENT_AMBULATORY_CARE_PROVIDER_SITE_OTHER): Payer: Medicare Other | Admitting: Ophthalmology

## 2018-04-17 ENCOUNTER — Other Ambulatory Visit: Payer: Self-pay | Admitting: Family Medicine

## 2018-04-20 ENCOUNTER — Other Ambulatory Visit: Payer: Self-pay | Admitting: Family Medicine

## 2018-04-20 DIAGNOSIS — Z961 Presence of intraocular lens: Secondary | ICD-10-CM | POA: Diagnosis not present

## 2018-04-20 DIAGNOSIS — I5032 Chronic diastolic (congestive) heart failure: Secondary | ICD-10-CM

## 2018-04-20 DIAGNOSIS — H401133 Primary open-angle glaucoma, bilateral, severe stage: Secondary | ICD-10-CM | POA: Diagnosis not present

## 2018-04-20 DIAGNOSIS — H31013 Macula scars of posterior pole (postinflammatory) (post-traumatic), bilateral: Secondary | ICD-10-CM | POA: Diagnosis not present

## 2018-04-27 ENCOUNTER — Telehealth: Payer: Self-pay | Admitting: Family Medicine

## 2018-04-27 NOTE — Telephone Encounter (Signed)
Amy from [facility] # [phone] states was contacted by family to do in home evaluation of patient, Pt's family would like Dr. Raliegh Scarlet to maintain PCP care for patient although entering Hosp[facility] care.   -- Forwarding message to medical assistant to call Amy .  --glh

## 2018-04-28 NOTE — Telephone Encounter (Signed)
Called and spoke to Amy, care will be given at home.  Dr. Raliegh Scarlet ok to be listed as attending. MPulliam, CMA/RT(R)

## 2018-04-29 ENCOUNTER — Encounter (INDEPENDENT_AMBULATORY_CARE_PROVIDER_SITE_OTHER): Payer: Medicare Other | Admitting: Ophthalmology

## 2018-05-01 ENCOUNTER — Telehealth: Payer: Self-pay | Admitting: Family Medicine

## 2018-05-01 NOTE — Telephone Encounter (Signed)
Forwarding message to medical assistant to call [facility] rep @ [phone] regarding patient care.  --glh

## 2018-05-04 ENCOUNTER — Other Ambulatory Visit: Payer: Self-pay | Admitting: Family Medicine

## 2018-05-04 DIAGNOSIS — E876 Hypokalemia: Secondary | ICD-10-CM

## 2018-05-04 NOTE — Telephone Encounter (Signed)
[employer] customer service closed for the [place]. MPulliam, CMA/RT(R)

## 2018-05-06 NOTE — Telephone Encounter (Signed)
Called left message to call the office back. MPulliam, CMA/RT(R)

## 2018-05-27 ENCOUNTER — Encounter (INDEPENDENT_AMBULATORY_CARE_PROVIDER_SITE_OTHER): Payer: Medicare Other | Admitting: Ophthalmology

## 2018-06-08 ENCOUNTER — Ambulatory Visit: Payer: Medicare Other | Admitting: Family Medicine

## 2018-06-10 ENCOUNTER — Ambulatory Visit: Payer: Medicare Other | Admitting: Family Medicine

## 2018-07-15 DEATH — deceased

## 2019-05-24 IMAGING — CT CT CERVICAL SPINE W/O CM
3 of 4 series · 16 of 34 positions shown, 19 images · non-contrast
Comparison: CT head 02/17/2015

CLINICAL DATA: Fall

EXAM:
CT HEAD WITHOUT CONTRAST
CT CERVICAL SPINE WITHOUT CONTRAST
TECHNIQUE: Multidetector CT imaging of the head and cervical spine was
performed following the standard protocol without intravenous
contrast. Multiplanar CT image reconstructions of the cervical spine
were also generated.

[Series 4: head bone · axial · 0.41mm/px · z∈[-113,+15]mm · 8 of 84 slices shown, 10 images]
[im 10/84  soft-tissue]
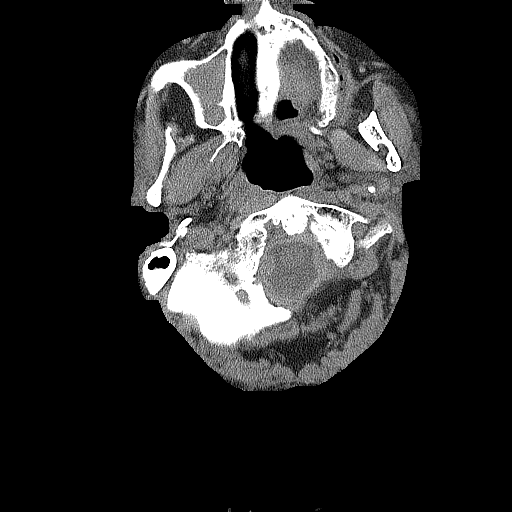
[im 10/84  bone]
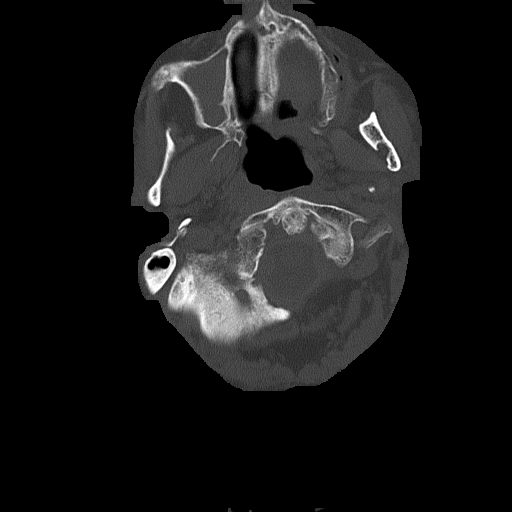
[im 19/84  bone]
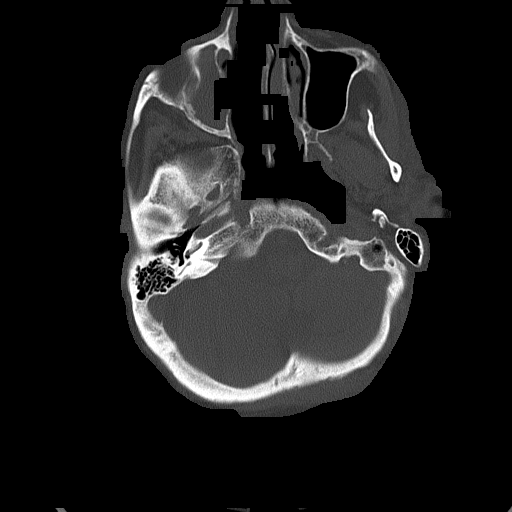
[im 28/84  bone]
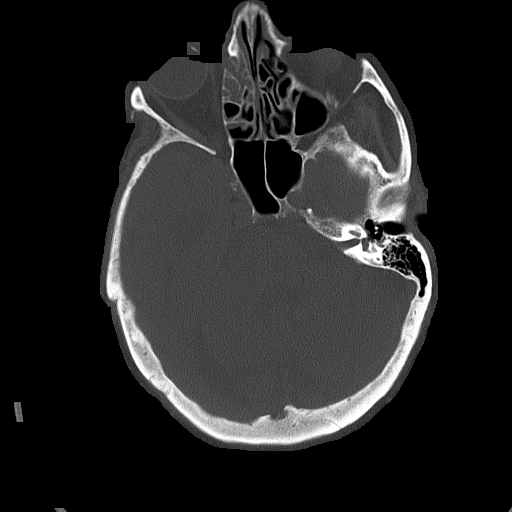
[im 37/84  bone]
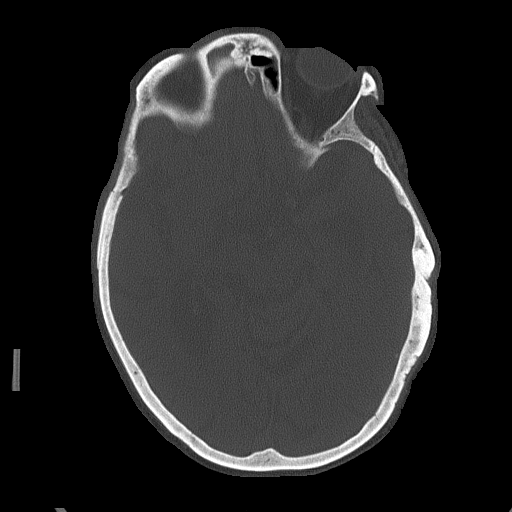
[im 47/84  soft-tissue]
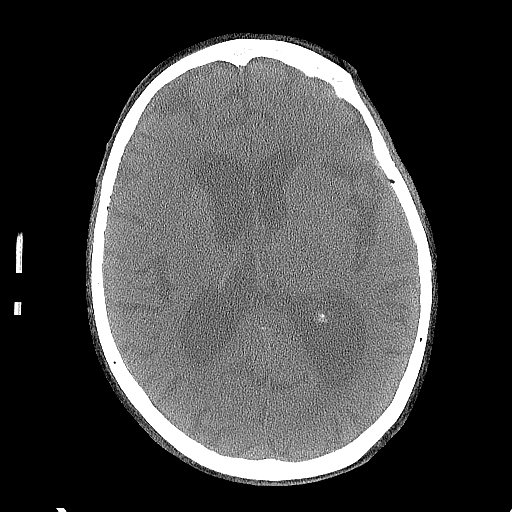
[im 47/84  bone]
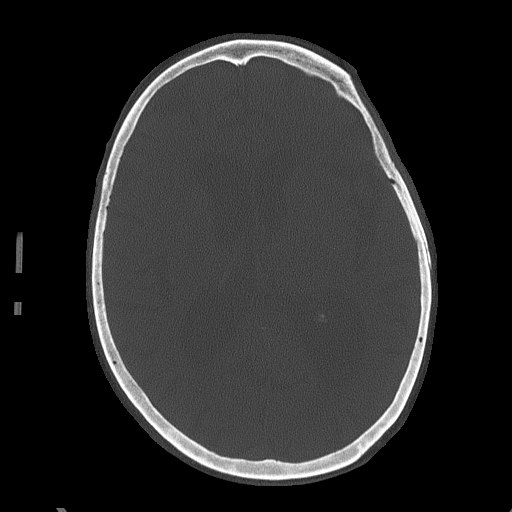
[im 56/84  bone]
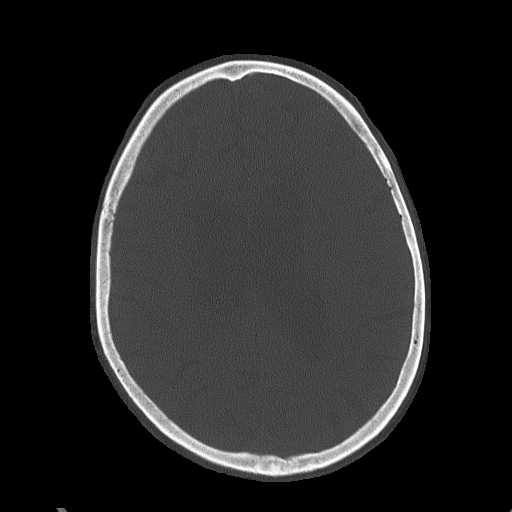
[im 65/84  bone]
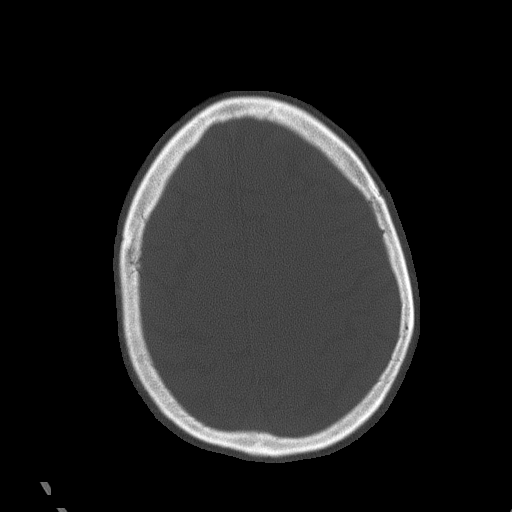
[im 74/84  bone]
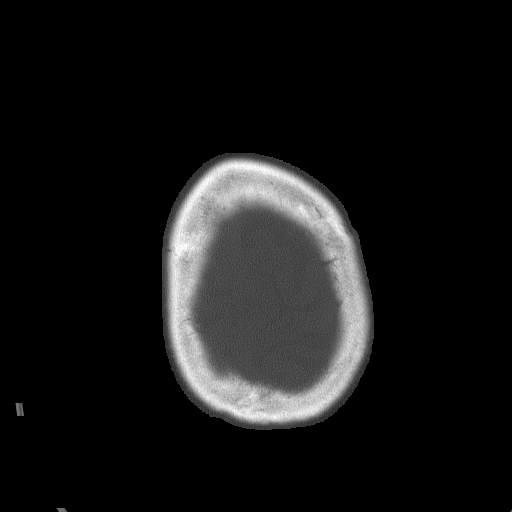

[Series 5: head without cor · coronal · non-contrast · 0.37mm/px · 3 of 67 slices shown]
[im 14/67  bone]
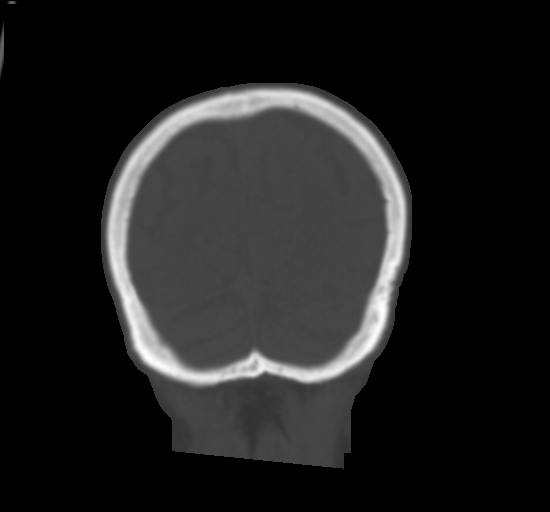
[im 27/67  bone]
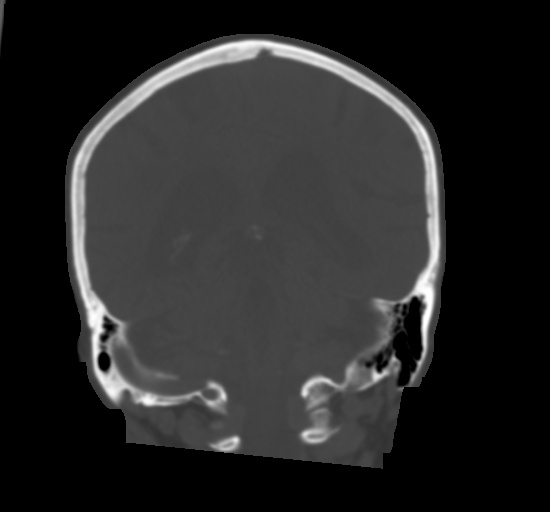
[im 40/67  bone]
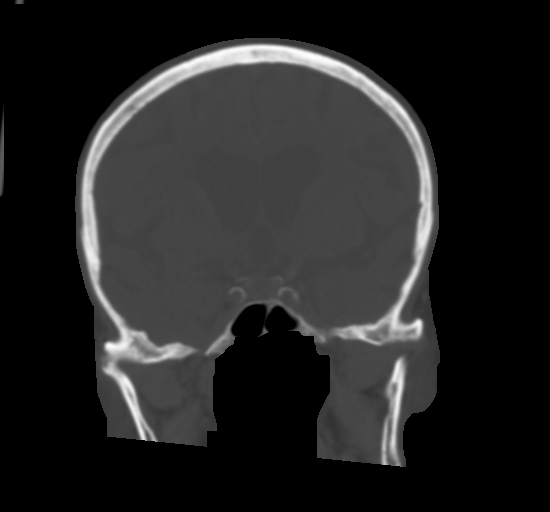

[Series 6: head without sag · sagittal · non-contrast · 0.37mm/px · 5 of 69 slices shown, 6 images]
[im 23/69  bone]
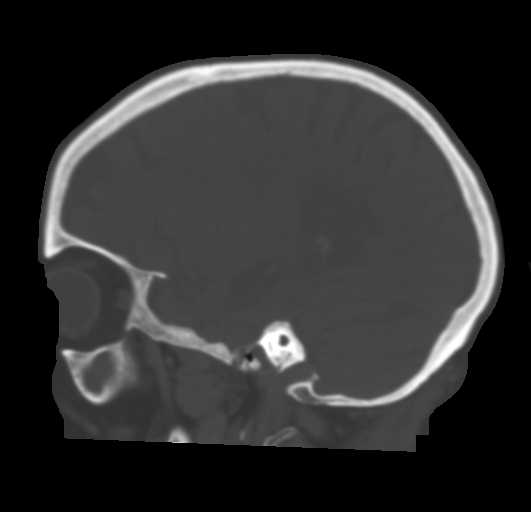
[im 29/69  bone]
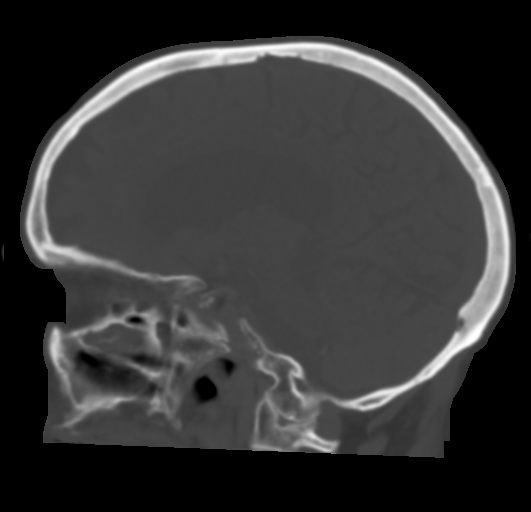
[im 35/69  soft-tissue]
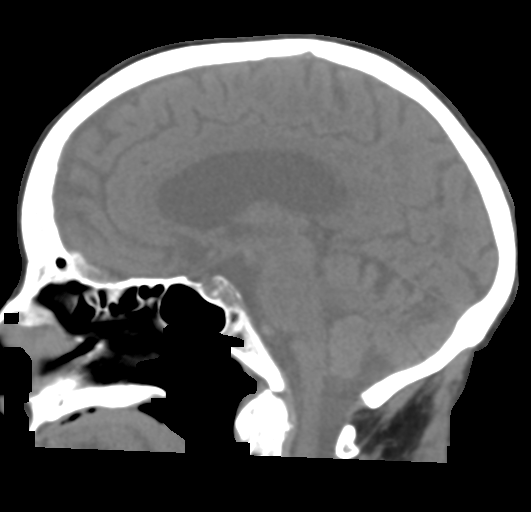
[im 35/69  bone]
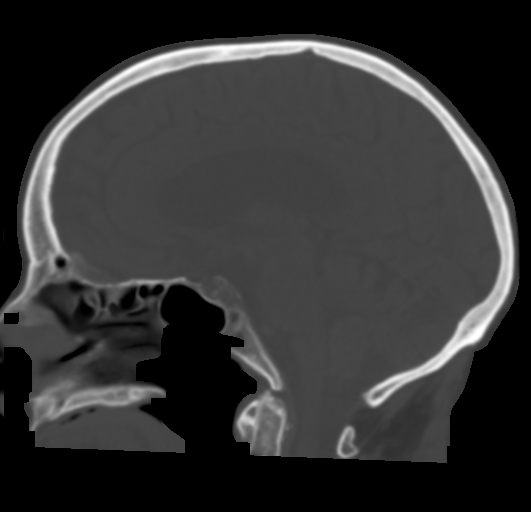
[im 40/69  bone]
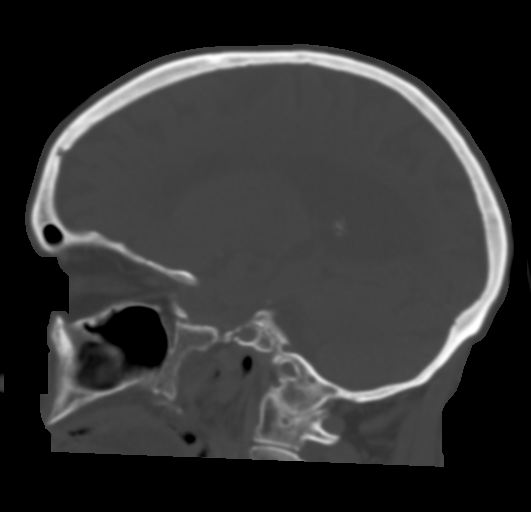
[im 46/69  bone]
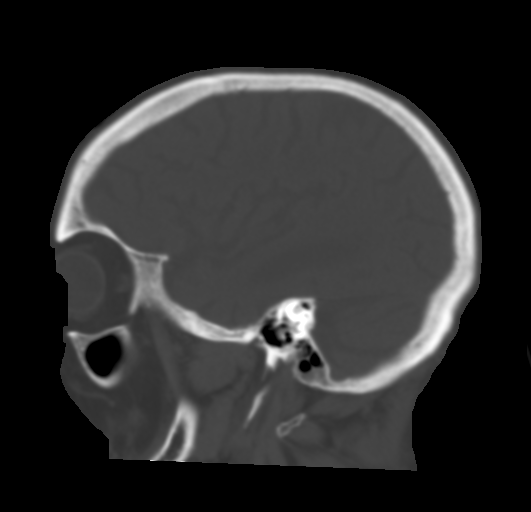

[16 of 34 positions shown; findings below may reference images not displayed]

FINDINGS: CT HEAD FINDINGS

Brain: Moderate atrophy with ventricular enlargement. Progression of
atrophy. Chronic microvascular ischemic changes in the white matter.

Negative for acute infarct, hemorrhage, or mass.

Vascular: Negative for hyperdense vessel

Skull: Negative for skull fracture

Sinuses/Orbits: Mucosal edema and bony thickening of the right
maxillary sinus. Mucosal edema right frontal and ethmoid sinus.
Bilateral cataract surgery.

Other: None

CT CERVICAL SPINE FINDINGS

Alignment: Mild anterolisthesis C4-5 and C5-6

Skull base and vertebrae: Negative for fracture. Hemangioma C7 and
T1 vertebral bodies.

Soft tissues and spinal canal: Multinodular goiter. Diffuse
atherosclerotic calcification

Disc levels: Diffuse disc and facet degeneration throughout the
cervical spine. Mild foraminal narrowing bilaterally at multiple
levels. Spinal canal adequate in size.

Upper chest: No acute abnormality.

Other: None
IMPRESSION: 1. Atrophy and chronic microvascular ischemia. No acute intracranial
abnormality
2. Negative for cervical spine fracture. Moderate degenerative
change.

## 2019-05-24 IMAGING — DX DG CHEST 1V PORT
1 series · 1 of 1 positions shown · non-contrast
Comparison: Portable chest x-ray dated February 17, 2015

CLINICAL DATA: Fell today. History of atrial fibrillation. No
current chest complaints.

EXAM:
PORTABLE CHEST 1 VIEW

[chest]
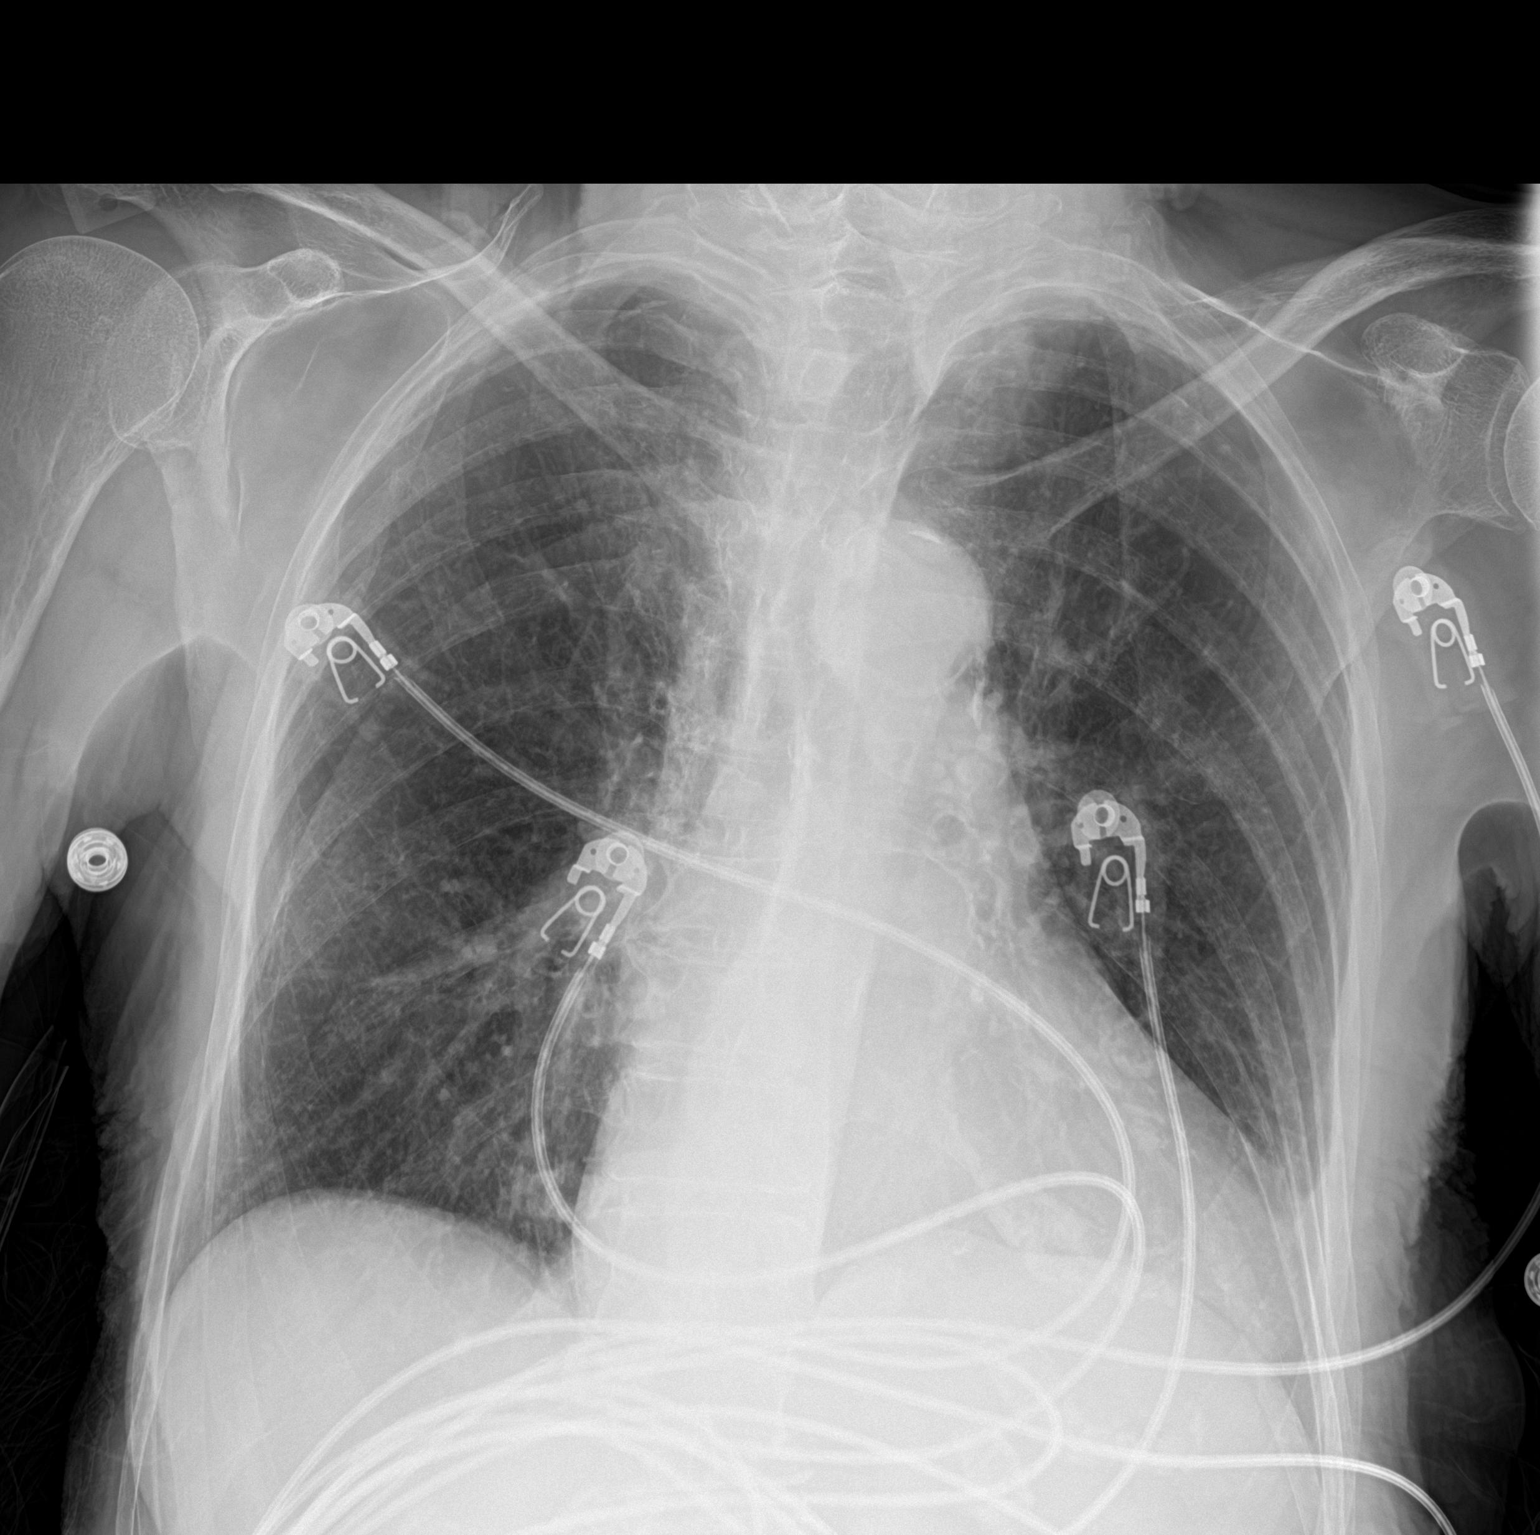

[1 of 1 positions shown; findings below may reference images not displayed]

FINDINGS: The lungs are mildly hyperinflated. The interstitial markings are
coarse. There is no alveolar infiltrate or pleural effusion. There
is no pneumothorax. The cardiac silhouette is chronically enlarged.
The pulmonary vascularity is normal. There is calcification in the
wall of the aortic arch. The bony thorax exhibits no acute
abnormality.
IMPRESSION: Chronic bronchitic changes. Stable cardiomegaly. There is no acute
cardiopulmonary abnormality.

Thoracic aortic atherosclerosis.
